# Patient Record
Sex: Male | Born: 1985
Health system: Southern US, Community
[De-identification: ages and names within clinical notes are randomized; demographics above are authoritative.]

## PROBLEM LIST (undated history)

## (undated) DIAGNOSIS — G4733 Obstructive sleep apnea (adult) (pediatric): Secondary | ICD-10-CM

## (undated) DIAGNOSIS — F0390 Unspecified dementia without behavioral disturbance: Secondary | ICD-10-CM

## (undated) DIAGNOSIS — R531 Weakness: Secondary | ICD-10-CM

## (undated) DIAGNOSIS — K219 Gastro-esophageal reflux disease without esophagitis: Secondary | ICD-10-CM

## (undated) DIAGNOSIS — R51 Headache: Secondary | ICD-10-CM

## (undated) DIAGNOSIS — R413 Other amnesia: Secondary | ICD-10-CM

## (undated) DIAGNOSIS — D352 Benign neoplasm of pituitary gland: Secondary | ICD-10-CM

## (undated) DIAGNOSIS — Q909 Down syndrome, unspecified: Secondary | ICD-10-CM

## (undated) DIAGNOSIS — R1319 Other dysphagia: Secondary | ICD-10-CM

## (undated) DIAGNOSIS — G4700 Insomnia, unspecified: Secondary | ICD-10-CM

## (undated) DIAGNOSIS — G919 Hydrocephalus, unspecified: Secondary | ICD-10-CM

## (undated) DIAGNOSIS — R1310 Dysphagia, unspecified: Secondary | ICD-10-CM

## (undated) HISTORY — PX: TYMPANOSTOMY TUBE PLACEMENT: SHX32

## (undated) HISTORY — DX: Dysphagia, unspecified: R13.10

## (undated) HISTORY — DX: Other amnesia: R41.3

## (undated) HISTORY — DX: Benign neoplasm of pituitary gland: D35.2

## (undated) HISTORY — DX: Unspecified dementia, unspecified severity, without behavioral disturbance, psychotic disturbance, mood disturbance, and anxiety: F03.90

## (undated) HISTORY — PX: BRAIN SURGERY: SHX531

## (undated) HISTORY — DX: Headache: R51

## (undated) HISTORY — DX: Insomnia, unspecified: G47.00

## (undated) HISTORY — PX: CSF SHUNT: SHX92

## (undated) HISTORY — DX: Weakness: R53.1

## (undated) HISTORY — DX: Other dysphagia: R13.19

## (undated) HISTORY — DX: Obstructive sleep apnea (adult) (pediatric): G47.33

## (undated) HISTORY — PX: VENTRICULOPERITONEAL SHUNT: SHX204

---
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
## 2005-01-25 ENCOUNTER — Emergency Department (HOSPITAL_COMMUNITY): Admission: EM | Admit: 2005-01-25 | Discharge: 2005-01-25 | Payer: Self-pay | Admitting: Emergency Medicine

## 2005-11-19 ENCOUNTER — Ambulatory Visit (HOSPITAL_COMMUNITY): Admission: RE | Admit: 2005-11-19 | Discharge: 2005-11-19 | Payer: Self-pay | Admitting: Pediatrics

## 2005-11-26 ENCOUNTER — Ambulatory Visit: Payer: Self-pay | Admitting: Pediatrics

## 2005-11-26 ENCOUNTER — Ambulatory Visit (HOSPITAL_COMMUNITY): Admission: RE | Admit: 2005-11-26 | Discharge: 2005-11-26 | Payer: Self-pay | Admitting: Pediatrics

## 2006-06-26 ENCOUNTER — Encounter: Admission: RE | Admit: 2006-06-26 | Discharge: 2006-06-26 | Payer: Self-pay | Admitting: Pediatrics

## 2008-01-18 ENCOUNTER — Ambulatory Visit (HOSPITAL_COMMUNITY): Payer: Self-pay | Admitting: Psychiatry

## 2008-02-15 ENCOUNTER — Ambulatory Visit (HOSPITAL_COMMUNITY): Payer: Self-pay | Admitting: Psychiatry

## 2008-03-01 ENCOUNTER — Ambulatory Visit (HOSPITAL_COMMUNITY): Payer: Self-pay | Admitting: Psychiatry

## 2008-03-21 ENCOUNTER — Ambulatory Visit (HOSPITAL_COMMUNITY): Payer: Self-pay | Admitting: Psychiatry

## 2008-05-02 ENCOUNTER — Ambulatory Visit (HOSPITAL_COMMUNITY): Payer: Self-pay | Admitting: Psychiatry

## 2008-06-14 ENCOUNTER — Ambulatory Visit (HOSPITAL_COMMUNITY): Payer: Self-pay | Admitting: Psychiatry

## 2009-04-03 ENCOUNTER — Ambulatory Visit (HOSPITAL_COMMUNITY): Payer: Self-pay | Admitting: Psychiatry

## 2009-08-03 ENCOUNTER — Ambulatory Visit (HOSPITAL_COMMUNITY): Payer: Self-pay | Admitting: Psychiatry

## 2009-10-31 ENCOUNTER — Ambulatory Visit (HOSPITAL_COMMUNITY): Payer: Self-pay | Admitting: Psychiatry

## 2010-03-14 ENCOUNTER — Ambulatory Visit (HOSPITAL_COMMUNITY): Admission: RE | Admit: 2010-03-14 | Discharge: 2010-03-14 | Payer: Self-pay | Admitting: Pediatrics

## 2010-03-18 ENCOUNTER — Ambulatory Visit: Payer: Self-pay | Admitting: Diagnostic Radiology

## 2010-03-18 ENCOUNTER — Inpatient Hospital Stay (HOSPITAL_COMMUNITY): Admission: AD | Admit: 2010-03-18 | Discharge: 2010-03-22 | Payer: Self-pay | Admitting: Internal Medicine

## 2010-03-18 ENCOUNTER — Encounter: Payer: Self-pay | Admitting: Emergency Medicine

## 2010-05-26 ENCOUNTER — Encounter: Payer: Self-pay | Admitting: Family Medicine

## 2010-05-26 ENCOUNTER — Encounter: Payer: Self-pay | Admitting: Pediatrics

## 2010-06-04 ENCOUNTER — Ambulatory Visit (HOSPITAL_COMMUNITY)
Admission: RE | Admit: 2010-06-04 | Discharge: 2010-06-04 | Payer: Self-pay | Source: Home / Self Care | Attending: Psychiatry | Admitting: Psychiatry

## 2010-07-17 LAB — URINE CULTURE
Colony Count: NO GROWTH
Culture  Setup Time: 201111140903
Culture: NO GROWTH
Special Requests: NEGATIVE

## 2010-07-17 LAB — CULTURE, BLOOD (ROUTINE X 2)
Culture  Setup Time: 201111131138
Culture: NO GROWTH

## 2010-07-17 LAB — CBC
HCT: 41.3 % (ref 39.0–52.0)
MCH: 31.7 pg (ref 26.0–34.0)
MCH: 32.2 pg (ref 26.0–34.0)
MCHC: 33.4 g/dL (ref 30.0–36.0)
MCV: 94.8 fL (ref 78.0–100.0)
Platelets: 154 10*3/uL (ref 150–400)
Platelets: 157 10*3/uL (ref 150–400)
RBC: 3.73 MIL/uL — ABNORMAL LOW (ref 4.22–5.81)
RDW: 13.8 % (ref 11.5–15.5)
RDW: 15.3 % (ref 11.5–15.5)
WBC: 10.9 10*3/uL — ABNORMAL HIGH (ref 4.0–10.5)
WBC: 5.7 10*3/uL (ref 4.0–10.5)

## 2010-07-17 LAB — EXPECTORATED SPUTUM ASSESSMENT W GRAM STAIN, RFLX TO RESP C

## 2010-07-17 LAB — BASIC METABOLIC PANEL
BUN: 11 mg/dL (ref 6–23)
BUN: 22 mg/dL (ref 6–23)
Calcium: 7.8 mg/dL — ABNORMAL LOW (ref 8.4–10.5)
Chloride: 105 mEq/L (ref 96–112)
Creatinine, Ser: 1.01 mg/dL (ref 0.4–1.5)
Creatinine, Ser: 1.3 mg/dL (ref 0.4–1.5)
GFR calc Af Amer: >60 mL/min (ref >60)
Glucose, Bld: 126 mg/dL — ABNORMAL HIGH (ref 70–99)
Potassium: 3.9 mEq/L (ref 3.5–5.1)

## 2010-07-17 LAB — URINALYSIS, ROUTINE W REFLEX MICROSCOPIC
Glucose, UA: NEGATIVE mg/dL
Ketones, ur: NEGATIVE mg/dL
Leukocytes, UA: NEGATIVE
Nitrite: NEGATIVE
pH: 6 (ref 5.0–8.0)

## 2010-07-17 LAB — URINE MICROSCOPIC-ADD ON

## 2010-07-17 LAB — DIFFERENTIAL
Basophils Absolute: 0 10*3/uL (ref 0.0–0.1)
Basophils Relative: 0 % (ref 0–1)
Eosinophils Absolute: 0 10*3/uL (ref 0.0–0.7)
Eosinophils Relative: 0 % (ref 0–5)
Lymphocytes Relative: 7 % — ABNORMAL LOW (ref 12–46)
Monocytes Absolute: 0.4 10*3/uL (ref 0.1–1.0)

## 2010-07-17 LAB — LEGIONELLA ANTIGEN, URINE

## 2010-07-17 LAB — LACTIC ACID, PLASMA: Lactic Acid, Venous: 1 mmol/L (ref 0.5–2.2)

## 2010-07-17 LAB — POCT I-STAT 3, ART BLOOD GAS (G3+)
Bicarbonate: 25.1 mEq/L — ABNORMAL HIGH (ref 20.0–24.0)
O2 Saturation: 84 %
Patient temperature: 101.8

## 2010-07-17 LAB — PROCALCITONIN: Procalcitonin: 1.55 ng/mL

## 2010-07-17 LAB — CULTURE, RESPIRATORY W GRAM STAIN: Culture: NORMAL

## 2010-07-17 LAB — MRSA PCR SCREENING: MRSA by PCR: NEGATIVE

## 2010-07-17 LAB — STREP PNEUMONIAE URINARY ANTIGEN: Strep Pneumo Urinary Antigen: NEGATIVE

## 2010-07-18 LAB — BASIC METABOLIC PANEL
Calcium: 9 mg/dL (ref 8.4–10.5)
GFR calc Af Amer: >60 mL/min (ref >60)
GFR calc non Af Amer: >60 mL/min (ref >60)
Glucose, Bld: 130 mg/dL — ABNORMAL HIGH (ref 70–99)
Potassium: 4.1 mEq/L (ref 3.5–5.1)
Sodium: 138 mEq/L (ref 135–145)

## 2010-07-18 LAB — CBC
HCT: 42.8 % (ref 39.0–52.0)
Hemoglobin: 14.4 g/dL (ref 13.0–17.0)
MCHC: 33.6 g/dL (ref 30.0–36.0)
RBC: 4.55 MIL/uL (ref 4.22–5.81)
WBC: 6.8 10*3/uL (ref 4.0–10.5)

## 2010-07-18 LAB — SURGICAL PCR SCREEN
MRSA, PCR: NEGATIVE
Staphylococcus aureus: NEGATIVE

## 2010-08-30 ENCOUNTER — Encounter (HOSPITAL_COMMUNITY): Payer: Medicare Other | Admitting: Psychiatry

## 2010-09-21 NOTE — Op Note (Signed)
[place]. [facility]  Patient:    Sergio [name], Sergio [name]                         MRN: [id number] Proc. Date: 08/14/99 Adm. Date:  [id number] Disc. Date: [id number] Attending:  Lauree Chandler                           Operative Report  PREOPERATIVE DIAGNOSIS:  Phimosis and possible urethral meatal stenosis.  POSTOPERATIVE DIAGNOSIS:  Phimosis and urethral meatal stenosis.  OPERATION PERFORMED:  Circumcision and urethral meatotomy.  SURGEON:  Maretta Bees. Vonita Moss, M.D.  ANESTHESIA:  General.  INDICATIONS FOR PROCEDURE:  This 25 year old white male with Down syndrome has had trouble with drawing the foreskin back and cleaning the glans penis. There was also a question of whether he has an adequate urethral meatus and he is brought to the operating room for these reasons.  DESCRIPTION OF PROCEDURE:  The patient was brought to the operating room and placed in supine position.  The external genitalia were prepped and draped in the usual fashion.  I sounded his urethral meatus and it was just slightly small and there was a dimple on the very end of the penis so he has a very mild glandular hypospadius with mild meatal stenosis and I did a dorsal meatotomy.  This was done with a straight clamp and a scissors.  Circumcision was then performed using the sleeve technique.  Hemostasis was obtained by use of electrocautery.  Interrupted 4-0 chromic catgut was used to close the circumcision.  Marcaine 0.25% was injected for postoperative analgesia.  The patient tolerated the procedure well and there was negligible blood loss.  The patient was taken to the operating room in good condition. DD:  08/14/99 TD:  08/14/99 Job: [id number]

## 2010-09-21 NOTE — Procedures (Signed)
HISTORY:  The patient is a 25 year old evaluated for visual and auditory  hallucinations to determine the presence or absence of seizures.   PROCEDURE:  The tracing is carried out in a 32 channel digital [employer]well  recorder reformatted into 16 channel montages with one devoted to EKG.  The  patient was awake and drowsy during the recording.  The [employer] 10/20  system lead placement was used.   DESCRIPTION OF FINDINGS:  Dominant frequency is at 12 Hz, 40 microvolt alpha  range activity that is prominent in the posterior regions but rather broadly  distributed mixed frequency low voltage upper theta and frontally  predominant beta range activity is seen, this of less than 10 microvolts.  The background begins the patient drowsiness with mixed frequency 6 Hz, 15-  60 microvolt activity.  Light natural sleep was not achieved.  Hyperventilation caused no change.  Photic stimulation was not carried out.   EKG showed regular sinus rhythm with ventricular response of 66-78 beats per  minute.   IMPRESSION:  Normal record with the patient awake and drowsy.      Sergio [name]. Sharene Skeans, M.D.  Electronically Signed     FIE:PPIR  D:  11/19/2005 14:24:37  T:  11/19/2005 17:52:48  Job #:  [id number]

## 2010-11-29 ENCOUNTER — Encounter (HOSPITAL_COMMUNITY): Payer: Medicare Other | Admitting: Psychiatry

## 2011-04-08 ENCOUNTER — Other Ambulatory Visit (HOSPITAL_COMMUNITY): Payer: Self-pay | Admitting: *Deleted

## 2011-04-08 MED ORDER — PALIPERIDONE ER 3 MG PO TB24: 3.0000 mg | ORAL_TABLET | ORAL | Status: DC

## 2011-06-03 DIAGNOSIS — C751 Malignant neoplasm of pituitary gland: Secondary | ICD-10-CM | POA: Diagnosis not present

## 2011-06-03 DIAGNOSIS — K219 Gastro-esophageal reflux disease without esophagitis: Secondary | ICD-10-CM | POA: Diagnosis not present

## 2011-06-04 ENCOUNTER — Other Ambulatory Visit (HOSPITAL_COMMUNITY): Payer: Self-pay | Admitting: Psychiatry

## 2011-06-04 ENCOUNTER — Other Ambulatory Visit (HOSPITAL_COMMUNITY): Payer: Self-pay

## 2011-06-04 DIAGNOSIS — G479 Sleep disorder, unspecified: Secondary | ICD-10-CM

## 2011-06-04 MED ORDER — CLONIDINE HCL 0.3 MG PO TABS: 0.3000 mg | ORAL_TABLET | Freq: Every day | ORAL | Status: DC

## 2011-06-04 MED ORDER — TRAZODONE HCL 100 MG PO TABS: ORAL_TABLET | ORAL | Status: DC

## 2011-07-02 ENCOUNTER — Other Ambulatory Visit (HOSPITAL_COMMUNITY): Payer: Self-pay | Admitting: Psychiatry

## 2011-07-02 DIAGNOSIS — F201 Disorganized schizophrenia: Secondary | ICD-10-CM

## 2011-07-05 ENCOUNTER — Inpatient Hospital Stay (HOSPITAL_BASED_OUTPATIENT_CLINIC_OR_DEPARTMENT_OTHER)
Admission: EM | Admit: 2011-07-05 | Discharge: 2011-07-08 | DRG: 194 | Disposition: A | Discharge status: Home / Self Care | Payer: Medicare Other | Attending: Internal Medicine | Admitting: Internal Medicine

## 2011-07-05 ENCOUNTER — Encounter (HOSPITAL_BASED_OUTPATIENT_CLINIC_OR_DEPARTMENT_OTHER): Payer: Self-pay | Admitting: *Deleted

## 2011-07-05 ENCOUNTER — Emergency Department (INDEPENDENT_AMBULATORY_CARE_PROVIDER_SITE_OTHER): Payer: Medicare Other

## 2011-07-05 DIAGNOSIS — R509 Fever, unspecified: Secondary | ICD-10-CM

## 2011-07-05 DIAGNOSIS — G911 Obstructive hydrocephalus: Secondary | ICD-10-CM | POA: Diagnosis present

## 2011-07-05 DIAGNOSIS — F259 Schizoaffective disorder, unspecified: Secondary | ICD-10-CM | POA: Diagnosis present

## 2011-07-05 DIAGNOSIS — R52 Pain, unspecified: Secondary | ICD-10-CM | POA: Diagnosis not present

## 2011-07-05 DIAGNOSIS — R918 Other nonspecific abnormal finding of lung field: Secondary | ICD-10-CM

## 2011-07-05 DIAGNOSIS — K219 Gastro-esophageal reflux disease without esophagitis: Secondary | ICD-10-CM

## 2011-07-05 DIAGNOSIS — Z888 Allergy status to other drugs, medicaments and biological substances status: Secondary | ICD-10-CM

## 2011-07-05 DIAGNOSIS — J189 Pneumonia, unspecified organism: Secondary | ICD-10-CM | POA: Diagnosis not present

## 2011-07-05 DIAGNOSIS — G479 Sleep disorder, unspecified: Secondary | ICD-10-CM

## 2011-07-05 DIAGNOSIS — Q909 Down syndrome, unspecified: Secondary | ICD-10-CM | POA: Diagnosis not present

## 2011-07-05 DIAGNOSIS — Z880 Allergy status to penicillin: Secondary | ICD-10-CM

## 2011-07-05 HISTORY — DX: Hydrocephalus, unspecified: G91.9

## 2011-07-05 HISTORY — DX: Down syndrome, unspecified: Q90.9

## 2011-07-05 HISTORY — DX: Gastro-esophageal reflux disease without esophagitis: K21.9

## 2011-07-05 LAB — BASIC METABOLIC PANEL
BUN: 16 mg/dL (ref 6–23)
Calcium: 8.8 mg/dL (ref 8.4–10.5)
GFR calc Af Amer: >90 mL/min (ref >90)
GFR calc non Af Amer: 83 mL/min — ABNORMAL LOW (ref >90)
Glucose, Bld: 110 mg/dL — ABNORMAL HIGH (ref 70–99)
Potassium: 3.9 mEq/L (ref 3.5–5.1)
Sodium: 137 mEq/L (ref 135–145)

## 2011-07-05 LAB — DIFFERENTIAL
Basophils Relative: 0 % (ref 0–1)
Lymphocytes Relative: 11 % — ABNORMAL LOW (ref 12–46)
Lymphs Abs: 0.8 10*3/uL (ref 0.7–4.0)
Monocytes Absolute: 0.1 10*3/uL (ref 0.1–1.0)
Monocytes Relative: 1 % — ABNORMAL LOW (ref 3–12)
Neutro Abs: 6.5 10*3/uL (ref 1.7–7.7)
Neutrophils Relative %: 88 % — ABNORMAL HIGH (ref 43–77)

## 2011-07-05 LAB — CBC
HCT: 43.5 % (ref 39.0–52.0)
Hemoglobin: 15 g/dL (ref 13.0–17.0)
RBC: 4.79 MIL/uL (ref 4.22–5.81)
WBC: 7.4 10*3/uL (ref 4.0–10.5)

## 2011-07-05 MED ORDER — MOXIFLOXACIN HCL IN NACL 400 MG/250ML IV SOLN
400.0000 mg | Freq: Once | INTRAVENOUS | Status: AC
  Administered 2011-07-05: 400 mg via INTRAVENOUS
  Filled 2011-07-05: qty 250

## 2011-07-05 MED ORDER — ACETAMINOPHEN 500 MG PO TABS
ORAL_TABLET | ORAL | Status: AC
  Filled 2011-07-05: qty 2

## 2011-07-05 MED ORDER — SODIUM CHLORIDE 0.9 % IV BOLUS (SEPSIS): 1000.0000 mL | Freq: Once | INTRAVENOUS | Status: DC

## 2011-07-05 MED ORDER — ACETAMINOPHEN 325 MG PO TABS
650.0000 mg | ORAL_TABLET | Freq: Once | ORAL | Status: AC
  Administered 2011-07-05: 650 mg via ORAL
  Filled 2011-07-05: qty 2

## 2011-07-05 MED ORDER — SODIUM CHLORIDE 0.9 % IV SOLN
Freq: Once | INTRAVENOUS | Status: AC
  Administered 2011-07-05: 1000 mL via INTRAVENOUS

## 2011-07-05 NOTE — ED Provider Notes (Signed)
History     CSN: [id number]  Arrival date & time 07/05/11  2000   First MD Initiated Contact with Patient 07/05/11 2025      Chief Complaint  Patient presents with  . Fever    (Consider location/radiation/quality/duration/timing/severity/associated sxs/prior treatment) Patient is a 26 y.o. male presenting with fever. The history is provided by a relative. The history is limited by a developmental delay.  Fever Primary symptoms of the febrile illness include fever.  His parents noted a shaking chill at 2 hours ago. This was followed by a fever which went up to 101 axillary at home. He has had a nonproductive cough for the last one to 2 days which they attributed to his GERD. He has not had any vomiting or diarrhea. He is complaining of aching all over.  Past Medical History  Diagnosis Date  . Down's syndrome   . GERD (gastroesophageal reflux disease)   . Hydrocephalus   . Schizoaffective disorder     Past Surgical History  Procedure Date  . Csf shunt   . Brain surgery     No family history on file.  History  Substance Use Topics  . Smoking status: Never Smoker   . Smokeless tobacco: Not on file  . Alcohol Use: No      Review of Systems  Unable to perform ROS: Psychiatric disorder  Constitutional: Positive for fever.    Allergies  Ceclor and Penicillins  Home Medications   Current Outpatient Rx  Name Route Sig Dispense Refill  . CLONIDINE HCL 0.3 MG PO TABS  TAKE ONE TABLET AT BEDTIME 30 tablet 2  . DONEPEZIL HCL 5 MG PO TABS Oral Take 5 mg by mouth at bedtime as needed.    . INVEGA 3 MG PO TB24  TAKE 1 TABLET IN MORNING 30 each 2  . OMEPRAZOLE 20 MG PO CPDR Oral Take 20 mg by mouth daily.    . TRAZODONE HCL 100 MG PO TABS  Take three tabs at bedtime. 90 tablet 0    BP 100/50  Pulse 140  Temp(Src) 103 F (39.4 C) (Oral)  Resp 20  Ht 5\' 1"  (1.549 m)  Wt 235 lb (106.595 kg)  BMI 44.40 kg/m2  SpO2 95%  Physical Exam  Nursing note and vitals  reviewed.  26 year old male who does appear mildly toxic. Vital signs are significant for fever of 103, tachycardia with heart rate 140. Oxygen saturation is 95% which is normal. Head is normocephalic and atraumatic. Facies of Down syndrome are present. Oropharynx is clear. Neck is nontender and supple without adenopathy. Back is nontender. Lungs are clear without rales, wheezes, or rhonchi. Heart has regular rate and rhythm which is tachycardic, a 2/6 systolic ejection murmur is present along left sternal border. Abdomen is obese, soft, nontender without masses or hepatosplenomegaly. Extremities have no cyanosis or edema. Skin is warm and dry without rash. Neurologic: He is oriented to person. Cranial nerves are grossly intact. There no focal motor or sensory deficits.  ED Course  Procedures (including critical care time)  Results for orders placed during the hospital encounter of 07/05/11  CBC      Component Value Range   WBC 7.4  4.0 - 10.5 (K/uL)   RBC 4.79  4.22 - 5.81 (MIL/uL)   Hemoglobin 15.0  13.0 - 17.0 (g/dL)   HCT 40.9  81.1 - 91.4 (%)   MCV 90.8  78.0 - 100.0 (fL)   MCH 31.3  26.0 - 34.0 (pg)  MCHC 34.5  30.0 - 36.0 (g/dL)   RDW 16.1  09.6 - 04.5 (%)   Platelets 232  150 - 400 (K/uL)  DIFFERENTIAL      Component Value Range   Neutrophils Relative 88 (*) 43 - 77 (%)   Neutro Abs 6.5  1.7 - 7.7 (K/uL)   Lymphocytes Relative 11 (*) 12 - 46 (%)   Lymphs Abs 0.8  0.7 - 4.0 (K/uL)   Monocytes Relative 1 (*) 3 - 12 (%)   Monocytes Absolute 0.1  0.1 - 1.0 (K/uL)   Eosinophils Relative 0  0 - 5 (%)   Eosinophils Absolute 0.0  0.0 - 0.7 (K/uL)   Basophils Relative 0  0 - 1 (%)   Basophils Absolute 0.0  0.0 - 0.1 (K/uL)  BASIC METABOLIC PANEL      Component Value Range   Sodium 137  135 - 145 (mEq/L)   Potassium 3.9  3.5 - 5.1 (mEq/L)   Chloride 101  96 - 112 (mEq/L)   CO2 23  19 - 32 (mEq/L)   Glucose, Bld 110 (*) 70 - 99 (mg/dL)   BUN 16  6 - 23 (mg/dL)   Creatinine, Ser  4.09  0.50 - 1.35 (mg/dL)   Calcium 8.8  8.4 - 81.1 (mg/dL)   GFR calc non Af Amer 83 (*) >90 (mL/min)   GFR calc Af Amer >90  >90 (mL/min)   Dg Chest Portable 1 View  07/05/2011  *RADIOLOGY REPORT*  Clinical Data: Fever started tonight.  Chills.  Aching.  Has Down syndrome.  PORTABLE CHEST - 1 VIEW  Comparison: 03/19/2010  Findings: Right IJ central line tip overlies the level of the upper superior vena cava.  The heart size is normal.  Heart size is accentuated by position.  There has been improvement in pulmonary edema.  There is opacity at the left lung base partially obscuring hemidiaphragms, consistent with atelectasis or possible effusion.  IMPRESSION:  1.  Some improvement in pulmonary edema. 2.  Left lower lobe opacity warrants followup.  Original Report Authenticated By: Patterson Hammersmith, M.D.     He was given IV fluids and oral acetaminophen. Chest x-ray is read by radiologist as possible effusion. However, I am concerned that the diaphragm is obscured and this likely represents a left lower lobe pneumonia. I would be most consistent with his clinical presentation today, and that is how he will be treated. He has allergies to penicillins and cephalosporins. He lives at home so he will be treated as community-acquired pneumonia. He is given a dose of IV moxifloxacin.  After acetaminophen and IV fluids, heart rate is coming down to 119 and temperature is down to 101.9. Case has been discussed with Dr. Cleotis Lema who agrees to accept him in transfer to the [facility].  1. Community acquired pneumonia    CRITICAL CARE Performed by: Dione Booze   Total critical care time: 35 minutes  Critical care time was exclusive of separately billable procedures and treating other patients.  Critical care was necessary to treat or prevent imminent or life-threatening deterioration.  Critical care was time spent personally by me on the following activities: development of treatment plan  with patient and/or surrogate as well as nursing, discussions with consultants, evaluation of patient's response to treatment, examination of patient, obtaining history from patient or surrogate, ordering and performing treatments and interventions, ordering and review of laboratory studies, ordering and review of radiographic studies, pulse oximetry and re-evaluation of patient's condition.  MDM  Fever and chills with tachycardia. Symptom complex is worrisome for pneumonia, although, UTI and influenza could also present similarly. He'll be given oral acetaminophen for his fever, IV fluids for tachycardia. Cultures will be obtained as well as chest x-ray.        Dione Booze, MD 07/05/11 2251

## 2011-07-05 NOTE — ED Notes (Signed)
Fever sudden onset tonight. Chills. Aching all over. Pt has Downs Syndrome.

## 2011-07-06 ENCOUNTER — Other Ambulatory Visit: Payer: Self-pay

## 2011-07-06 ENCOUNTER — Encounter (HOSPITAL_BASED_OUTPATIENT_CLINIC_OR_DEPARTMENT_OTHER): Payer: Self-pay | Admitting: Emergency Medicine

## 2011-07-06 DIAGNOSIS — R509 Fever, unspecified: Secondary | ICD-10-CM | POA: Diagnosis present

## 2011-07-06 DIAGNOSIS — F259 Schizoaffective disorder, unspecified: Secondary | ICD-10-CM | POA: Diagnosis not present

## 2011-07-06 DIAGNOSIS — Z888 Allergy status to other drugs, medicaments and biological substances status: Secondary | ICD-10-CM | POA: Diagnosis not present

## 2011-07-06 DIAGNOSIS — K219 Gastro-esophageal reflux disease without esophagitis: Secondary | ICD-10-CM | POA: Diagnosis present

## 2011-07-06 DIAGNOSIS — D696 Thrombocytopenia, unspecified: Secondary | ICD-10-CM | POA: Diagnosis not present

## 2011-07-06 DIAGNOSIS — Z880 Allergy status to penicillin: Secondary | ICD-10-CM | POA: Diagnosis not present

## 2011-07-06 DIAGNOSIS — G911 Obstructive hydrocephalus: Secondary | ICD-10-CM | POA: Diagnosis not present

## 2011-07-06 DIAGNOSIS — R05 Cough: Secondary | ICD-10-CM | POA: Diagnosis not present

## 2011-07-06 DIAGNOSIS — Q909 Down syndrome, unspecified: Secondary | ICD-10-CM | POA: Diagnosis not present

## 2011-07-06 DIAGNOSIS — J189 Pneumonia, unspecified organism: Secondary | ICD-10-CM | POA: Diagnosis not present

## 2011-07-06 DIAGNOSIS — R0602 Shortness of breath: Secondary | ICD-10-CM | POA: Diagnosis not present

## 2011-07-06 LAB — CBC
HCT: 40.9 % (ref 39.0–52.0)
Hemoglobin: 14 g/dL (ref 13.0–17.0)
MCH: 31.9 pg (ref 26.0–34.0)
MCHC: 34.2 g/dL (ref 30.0–36.0)
MCV: 93.2 fL (ref 78.0–100.0)
Platelets: DECREASED K/uL (ref 150–400)
RBC: 4.39 MIL/uL (ref 4.22–5.81)
RDW: 14.9 % (ref 11.5–15.5)
WBC: 5.4 K/uL (ref 4.0–10.5)

## 2011-07-06 LAB — COMPREHENSIVE METABOLIC PANEL WITH GFR
ALT: 181 U/L — ABNORMAL HIGH (ref 0–53)
AST: 89 U/L — ABNORMAL HIGH (ref 0–37)
Albumin: 2.7 g/dL — ABNORMAL LOW (ref 3.5–5.2)
Alkaline Phosphatase: 50 U/L (ref 39–117)
BUN: 11 mg/dL (ref 6–23)
CO2: 19 meq/L (ref 19–32)
Calcium: 8.8 mg/dL (ref 8.4–10.5)
Chloride: 107 meq/L (ref 96–112)
Creatinine, Ser: 1.05 mg/dL (ref 0.50–1.35)
GFR calc Af Amer: >90 mL/min
GFR calc non Af Amer: >90 mL/min
Glucose, Bld: 117 mg/dL — ABNORMAL HIGH (ref 70–99)
Potassium: 4.2 meq/L (ref 3.5–5.1)
Sodium: 138 meq/L (ref 135–145)
Total Bilirubin: 0.3 mg/dL (ref 0.3–1.2)
Total Protein: 6.1 g/dL (ref 6.0–8.3)

## 2011-07-06 LAB — DIFFERENTIAL
Basophils Absolute: 0 K/uL (ref 0.0–0.1)
Basophils Relative: 0 % (ref 0–1)
Eosinophils Absolute: 0 K/uL (ref 0.0–0.7)
Eosinophils Relative: 1 % (ref 0–5)
Lymphocytes Relative: 31 % (ref 12–46)
Lymphs Abs: 1.7 K/uL (ref 0.7–4.0)
Monocytes Absolute: 0.1 K/uL (ref 0.1–1.0)
Monocytes Relative: 2 % — ABNORMAL LOW (ref 3–12)
Neutro Abs: 3.6 K/uL (ref 1.7–7.7)
Neutrophils Relative %: 66 % (ref 43–77)

## 2011-07-06 LAB — INFLUENZA PANEL BY PCR (TYPE A & B)
H1N1 flu by pcr: NOT DETECTED
Influenza A By PCR: NEGATIVE
Influenza B By PCR: NEGATIVE

## 2011-07-06 LAB — RAPID HIV SCREEN (WH-MAU): Rapid HIV Screen: NONREACTIVE

## 2011-07-06 LAB — MRSA PCR SCREENING: MRSA by PCR: NEGATIVE

## 2011-07-06 LAB — STREP PNEUMONIAE URINARY ANTIGEN: Strep Pneumo Urinary Antigen: NEGATIVE

## 2011-07-06 MED ORDER — ENOXAPARIN SODIUM 40 MG/0.4ML ~~LOC~~ SOLN
40.0000 mg | SUBCUTANEOUS | Status: DC
  Administered 2011-07-06 – 2011-07-07 (×2): 40 mg via SUBCUTANEOUS
  Filled 2011-07-06 (×4): qty 0.4

## 2011-07-06 MED ORDER — DONEPEZIL HCL 5 MG PO TABS
5.0000 mg | ORAL_TABLET | Freq: Every day | ORAL | Status: DC
  Administered 2011-07-06 – 2011-07-07 (×2): 5 mg via ORAL
  Filled 2011-07-06 (×4): qty 1

## 2011-07-06 MED ORDER — OSELTAMIVIR PHOSPHATE 75 MG PO CAPS
75.0000 mg | ORAL_CAPSULE | Freq: Two times a day (BID) | ORAL | Status: DC
  Administered 2011-07-06: 75 mg via ORAL
  Filled 2011-07-06 (×2): qty 1

## 2011-07-06 MED ORDER — ALUM & MAG HYDROXIDE-SIMETH 200-200-20 MG/5ML PO SUSP
30.0000 mL | Freq: Once | ORAL | Status: AC
  Administered 2011-07-06: 30 mL via ORAL
  Filled 2011-07-06: qty 30

## 2011-07-06 MED ORDER — CLONIDINE HCL 0.3 MG PO TABS
0.3000 mg | ORAL_TABLET | Freq: Every day | ORAL | Status: DC
  Filled 2011-07-06: qty 1

## 2011-07-06 MED ORDER — CLONIDINE HCL 0.3 MG PO TABS
0.3000 mg | ORAL_TABLET | Freq: Every day | ORAL | Status: DC
  Administered 2011-07-06: 0.3 mg via ORAL
  Filled 2011-07-06 (×4): qty 1

## 2011-07-06 MED ORDER — DONEPEZIL HCL 5 MG PO TABS: 5.0000 mg | ORAL_TABLET | Freq: Every day | ORAL | Status: DC

## 2011-07-06 MED ORDER — CHLORHEXIDINE GLUCONATE CLOTH 2 % EX PADS
6.0000 | MEDICATED_PAD | Freq: Every day | CUTANEOUS | Status: DC
  Administered 2011-07-06 – 2011-07-08 (×3): 6 via TOPICAL

## 2011-07-06 MED ORDER — CLONIDINE HCL 0.3 MG PO TABS
0.3000 mg | ORAL_TABLET | ORAL | Status: AC
  Administered 2011-07-06: 0.3 mg via ORAL
  Filled 2011-07-06: qty 1

## 2011-07-06 MED ORDER — ONDANSETRON HCL 4 MG/2ML IJ SOLN: 4.0000 mg | Freq: Three times a day (TID) | INTRAMUSCULAR | Status: AC | PRN

## 2011-07-06 MED ORDER — SODIUM CHLORIDE 0.9 % IV SOLN
INTRAVENOUS | Status: DC
  Administered 2011-07-06: 04:00:00 via INTRAVENOUS
  Administered 2011-07-07: 75 mL/h via INTRAVENOUS
  Administered 2011-07-07: 22:00:00 via INTRAVENOUS

## 2011-07-06 MED ORDER — SODIUM CHLORIDE 0.9 % IV SOLN
INTRAVENOUS | Status: DC
  Administered 2011-07-06: 03:00:00 via INTRAVENOUS

## 2011-07-06 MED ORDER — ALBUTEROL SULFATE (5 MG/ML) 0.5% IN NEBU: 2.5000 mg | INHALATION_SOLUTION | Freq: Four times a day (QID) | RESPIRATORY_TRACT | Status: DC | PRN

## 2011-07-06 MED ORDER — MOXIFLOXACIN HCL IN NACL 400 MG/250ML IV SOLN
400.0000 mg | INTRAVENOUS | Status: DC
  Administered 2011-07-06 – 2011-07-07 (×2): 400 mg via INTRAVENOUS
  Filled 2011-07-06 (×3): qty 250

## 2011-07-06 MED ORDER — ACETAMINOPHEN 325 MG PO TABS
650.0000 mg | ORAL_TABLET | ORAL | Status: DC | PRN
  Administered 2011-07-06 (×2): 650 mg via ORAL
  Filled 2011-07-06 (×2): qty 2

## 2011-07-06 MED ORDER — SODIUM CHLORIDE 0.9 % IV SOLN: 250.0000 mL | INTRAVENOUS | Status: DC | PRN

## 2011-07-06 MED ORDER — MUPIROCIN 2 % EX OINT
1.0000 "application " | TOPICAL_OINTMENT | Freq: Two times a day (BID) | CUTANEOUS | Status: DC
  Administered 2011-07-06: 1 via NASAL
  Filled 2011-07-06: qty 22

## 2011-07-06 MED ORDER — PALIPERIDONE ER 3 MG PO TB24
3.0000 mg | ORAL_TABLET | Freq: Every day | ORAL | Status: DC
  Administered 2011-07-06 – 2011-07-08 (×3): 3 mg via ORAL
  Filled 2011-07-06 (×3): qty 1

## 2011-07-06 MED ORDER — TRAZODONE HCL 150 MG PO TABS
300.0000 mg | ORAL_TABLET | Freq: Every day | ORAL | Status: DC
  Administered 2011-07-06 – 2011-07-07 (×2): 300 mg via ORAL
  Filled 2011-07-06 (×4): qty 2

## 2011-07-06 MED ORDER — SODIUM CHLORIDE 0.9 % IJ SOLN: 3.0000 mL | INTRAMUSCULAR | Status: DC | PRN

## 2011-07-06 MED ORDER — DONEPEZIL HCL 5 MG PO TABS
5.0000 mg | ORAL_TABLET | Freq: Once | ORAL | Status: AC
  Administered 2011-07-06: 5 mg via ORAL
  Filled 2011-07-06 (×2): qty 1

## 2011-07-06 MED ORDER — TRAZODONE HCL 150 MG PO TABS
300.0000 mg | ORAL_TABLET | ORAL | Status: AC
  Administered 2011-07-06: 300 mg via ORAL
  Filled 2011-07-06: qty 2

## 2011-07-06 MED ORDER — SODIUM CHLORIDE 0.9 % IJ SOLN: 3.0000 mL | Freq: Two times a day (BID) | INTRAMUSCULAR | Status: DC

## 2011-07-06 NOTE — H&P (Signed)
PCP:   Rudi Heap, MD, MD   Chief Complaint:  Fever and chills.  HPI: This 26 year old male with a history of Down's syndrome. He was noted by his mother to be having cold chills. He was found to have a fever 101 axillary. No other symptom on presentation noted by the mother. He was not exposed to sick people that she is aware of. He did have a prior history of pneumonia. He did have a flu shot in November. He was seen at a [place] and chest x-ray was suggestive of a left lower lobe opacity. Patient was referred for admission to [facility].  Review of Systems:  The patient positive for anorexia, fever. No weight loss,, vision loss, decreased hearing, hoarseness, chest pain, syncope, dyspnea on exertion, peripheral edema, balance deficits, hemoptysis, abdominal pain, melena, hematochezia, severe indigestion/heartburn, hematuria, incontinence, genital sores, muscle weakness, suspicious skin lesions, transient blindness, difficulty walking, depression, unusual weight change, abnormal bleeding, enlarged lymph nodes, angioedema, and breast masses.  Past Medical History: Past Medical History  Diagnosis Date  . Down's syndrome   . GERD (gastroesophageal reflux disease)   . Hydrocephalus   . Schizoaffective disorder    Past Surgical History  Procedure Date  . Csf shunt   . Brain surgery     Medications: Prior to Admission medications   Medication Sig Start Date End Date Taking? Authorizing Provider  cloNIDine (CATAPRES) 0.3 MG tablet TAKE ONE TABLET AT BEDTIME 07/02/11  Yes Nelly Rout, MD  donepezil (ARICEPT) 5 MG tablet Take 5 mg by mouth at bedtime as needed.   Yes Historical Provider, MD  INVEGA 3 MG 24 hr tablet TAKE 1 TABLET IN MORNING 07/02/11  Yes Nelly Rout, MD  omeprazole (PRILOSEC) 20 MG capsule Take 20 mg by mouth daily.   Yes Historical Provider, MD  traZODone (DESYREL) 100 MG tablet Take three tabs at bedtime. 06/04/11  Yes Nelly Rout, MD    Allergies:   Allergies  Allergen Reactions  . Ceclor (Cefaclor) Anaphylaxis  . Penicillins Anaphylaxis    Social History:  reports that he has never smoked. He does not have any smokeless tobacco history on file. He reports that he does not drink alcohol or use illicit drugs. The patient is normally at baseline able to function independently with some guidance.  Family History: No contributory family history.  Physical Exam: Filed Vitals:   07/06/11 0100 07/06/11 0128 07/06/11 0129 07/06/11 0238  BP: 112/50 106/53  147/60  Pulse: 95 84  86  Temp:    99.3 F (37.4 C)  TempSrc:    Axillary  Resp: 26 26  26   Height:      Weight:      SpO2: 94% 94% 94% 96%   Physical exam. Gen. appearance. Well-developed young male in no distress. Patient is alert and cooperative. Pleasant and follows commands well. HEENT. Head normocephalic. Pupils are equal. Hearing normal to conversational tones. Oral mucosa unremarkable. No cervical adenopathy. Cardiac. Rate and rhythm regular. No jugular venous distention or edema. Lungs. Reduced in the left base. Otherwise clear without distress. Stable O2 sats. Abdomen. Soft with positive bowel sounds. No pain. Urinary genital. No bladder pain but has right CVA tenderness. Musculoskeletal range of motion in all 4 extremities full. Strength 5/5 and equal in all 4 extremities. Neurologic. No obvious cranial nerve deficits. No unilateral or focal defects found Skin. Warm and dry/hot consistent with fever. Turgor appears adequate.  Labs on Admission:   [facility] 07/05/11 2134  NA 137  K 3.9  CL 101  CO2 23  GLUCOSE 110*  BUN 16  CREATININE 1.20  CALCIUM 8.8  MG --  PHOS --   No results found for this basename: AST:2,ALT:2,ALKPHOS:2,BILITOT:2,PROT:2,ALBUMIN:2 in the last 72 hours No results found for this basename: LIPASE:2,AMYLASE:2 in the last 72 hours  Basename 07/05/11 2037  WBC 7.4  NEUTROABS 6.5  HGB 15.0  HCT 43.5  MCV 90.8  PLT 232   No results  found for this basename: CKTOTAL:3,CKMB:3,CKMBINDEX:3,TROPONINI:3 in the last 72 hours No results found for this basename: TSH,T4TOTAL,FREET3,T3FREE,THYROIDAB in the last 72 hours No results found for this basename: VITAMINB12:2,FOLATE:2,FERRITIN:2,TIBC:2,IRON:2,RETICCTPCT:2 in the last 72 hours  Radiological Exams on Admission: Dg Chest Portable 1 View  07/05/2011  *RADIOLOGY REPORT*  Clinical Data: Fever started tonight.  Chills.  Aching.  Has Down syndrome.  PORTABLE CHEST - 1 VIEW  Comparison: 03/19/2010  Findings: Right IJ central line tip overlies the level of the upper superior vena cava.  The heart size is normal.  Heart size is accentuated by position.  There has been improvement in pulmonary edema.  There is opacity at the left lung base partially obscuring hemidiaphragms, consistent with atelectasis or possible effusion.  IMPRESSION:  1.  Some improvement in pulmonary edema. 2.  Left lower lobe opacity warrants followup.  Original Report Authenticated By: Patterson Hammersmith, M.D.    Assessment/Plan Problem #1. Pneumonia. Blood cultures have already been obtained. The patient was started by EDP on Avelox and I will continue this dosing IV daily. He does not appear to have a productive cough per mothers report. I have asked for a sputum culture if possible. I have added albuterol nebulizers on a when necessary basis. Repeat a CBC in a.m. Given the high fever of 103 possible viral etiology. I will check influenza PCR and empirically start the patient on Tamiflu which can be discontinued if PCR negative. Problem #2 schizophrenia. Will continue patient's antipsychotic augmented with clonidine. Patient was alert and pleasant currently. Problem #3 insomnia. We'll continue her trazodone and Aricept as per home dosing which was confirmed by mother. Problem #4. GERD. I will hold a proton pump inhibitor during antibiotic therapy to reduce risk of C. difficile. Problem #5. Right flank pain. No indication  of UTI per urine microscopic. Urine culture has been requested. No change in current antibiotics for now. Patient,  is to be a full code per mothers wishes.  We will respect these wishes.  I anticipate his length of stay to be 2-3 days based on current information unless something should change.  Time spent on this patient including examination and decision-making process: 60 minutes.  Rolan Lipa [phone] 07/06/2011, 4:49 AM     Assessment/Plan: Present on Admission:  .Pneumonia .Fever .GERD (gastroesophageal reflux disease) .Schizoaffective disorder   Plan:  Agree with the Above Assessment by Benedetto Coons, NP. Continue IV Avelox for CAP Community Acquired Pneumonia Nebs, and O2 PRN Medications Reconciled. Other plans as per orders.    CODE STATUS:      FULL CODE        Ashyia Schraeder C 07/06/2011, 6:02 AM

## 2011-07-06 NOTE — ED Notes (Signed)
Report given to [facility] 

## 2011-07-06 NOTE — ED Notes (Signed)
Report called to Christine,RN on 5000 at [facility]

## 2011-07-06 NOTE — Progress Notes (Signed)
Patient seen and examined, admitted by Dr. Lovell Sheehan this morning. Briefly, 26 year old male with history of Down syndrome admitted with pneumonia. - Agree with assessment and plan per H&P - Continue IV Avelox, nebs, O2 - Added clonidine each bedtime prior mother's request, diet change to regular - Will follow closely   Varnell Orvis M.D. [facility] 07/06/2011, 2:12 PM  Pager: [phone]

## 2011-07-07 LAB — CBC
HCT: 43.4 % (ref 39.0–52.0)
Hemoglobin: 14.4 g/dL (ref 13.0–17.0)
MCH: 31.6 pg (ref 26.0–34.0)
MCHC: 33.2 g/dL (ref 30.0–36.0)
MCV: 95.2 fL (ref 78.0–100.0)
RDW: 15.4 % (ref 11.5–15.5)

## 2011-07-07 LAB — LEGIONELLA ANTIGEN, URINE: Legionella Antigen, Urine: NEGATIVE

## 2011-07-07 LAB — URINE CULTURE
Colony Count: NO GROWTH
Culture  Setup Time: 201303020517

## 2011-07-07 LAB — BASIC METABOLIC PANEL
BUN: 14 mg/dL (ref 6–23)
Creatinine, Ser: 1.07 mg/dL (ref 0.50–1.35)
GFR calc Af Amer: >90 mL/min (ref >90)
GFR calc non Af Amer: >90 mL/min (ref >90)
Glucose, Bld: 130 mg/dL — ABNORMAL HIGH (ref 70–99)
Potassium: 4.4 mEq/L (ref 3.5–5.1)

## 2011-07-07 NOTE — Progress Notes (Signed)
Patient ID: Sergio [name]    AVW:[id number]    DOB: 1985-10-16    DOA: 07/05/2011  PCP: Rudi Heap, MD, MD  Subjective: Improving today, father at bedside no specific complaints  Objective: Weight change:   Intake/Output Summary (Last 24 hours) at 07/07/11 1755 Last data filed at 07/07/11 1648  Gross per 24 hour  Intake   3301 ml  Output      0 ml  Net   3301 ml   Blood pressure 124/66, pulse 60, temperature 98.7 F (37.1 C), temperature source Oral, resp. rate 20, height 5\' 1"  (1.549 m), weight 106.595 kg (235 lb), SpO2 98.00%.  Physical Exam: General: Alert and awake, oriented x3, not in any acute distress. HEENT: anicteric sclera, pupils reactive to light and accommodation, EOMI CVS: S1-S2 clear, no murmur rubs or gallops Chest: clear to auscultation bilaterally, no wheezing, rales or rhonchi Abdomen: soft nontender, nondistended, normal bowel sounds, no organomegaly Extremities: no cyanosis, clubbing or edema noted bilaterally Neuro: Cranial nerves II-XII intact, no focal neurological deficits  Lab Results: Basic Metabolic Panel:  Lab 07/07/11 8295 07/06/11 1130  NA 138 138  K 4.4 4.2  CL 107 107  CO2 24 19  GLUCOSE 130* 117*  BUN 14 11  CREATININE 1.07 1.05  CALCIUM 8.8 8.8  MG -- --  PHOS -- --   Liver Function Tests:  Lab 07/06/11 1130  AST 89*  ALT 181*  ALKPHOS 50  BILITOT 0.3  PROT 6.1  ALBUMIN 2.7*   No results found for this basename: LIPASE:2,AMYLASE:2 in the last 168 hours No results found for this basename: AMMONIA:2 in the last 168 hours CBC:  Lab 07/07/11 0815 07/06/11 1130  WBC 5.7 5.4  NEUTROABS -- 3.6  HGB 14.4 14.0  HCT 43.4 40.9  MCV 95.2 93.2  PLT 177 PLATELETS APPEAR DECREASED   Cardiac Enzymes: No results found for this basename: CKTOTAL:3,CKMB:3,CKMBINDEX:3,TROPONINI:3 in the last 168 hours BNP: No components found with this basename: POCBNP:2 CBG: No results found for this basename: GLUCAP:5 in the last 168  hours   Micro Results: Recent Results (from the past 240 hour(s))  URINE CULTURE     Status: Normal   Collection Time   07/05/11  9:19 PM      Component Value Range Status Comment   Specimen Description URINE, CLEAN CATCH   Final    Special Requests NONE   Final    Culture  Setup Time [id number]   Final    Colony Count NO GROWTH   Final    Culture NO GROWTH   Final    Report Status 07/07/2011 FINAL   Final   CULTURE, BLOOD (ROUTINE X 2)     Status: Normal (Preliminary result)   Collection Time   07/05/11  9:20 PM      Component Value Range Status Comment   Specimen Description BLOOD LEFT WRIST   Final    Special Requests NONE BOTTLES DRAWN AEROBIC AND ANAEROBIC [facility]   Final    Culture  Setup Time [id number]   Final    Culture     Final    Value:        BLOOD CULTURE RECEIVED NO GROWTH TO DATE CULTURE WILL BE HELD FOR 5 DAYS BEFORE ISSUING A FINAL NEGATIVE REPORT   Report Status PENDING   Incomplete   CULTURE, BLOOD (ROUTINE X 2)     Status: Normal (Preliminary result)   Collection Time   07/05/11  9:20 PM  Component Value Range Status Comment   Specimen Description BLOOD LEFT WRIST   Final    Special Requests NONE BOTTLES DRAWN AEROBIC AND ANAEROBIC 2 CC EACH   Final    Culture  Setup Time [id number]   Final    Culture     Final    Value:        BLOOD CULTURE RECEIVED NO GROWTH TO DATE CULTURE WILL BE HELD FOR 5 DAYS BEFORE ISSUING A FINAL NEGATIVE REPORT   Report Status PENDING   Incomplete   MRSA PCR SCREENING     Status: Normal   Collection Time   07/06/11  6:50 AM      Component Value Range Status Comment   MRSA by PCR NEGATIVE  NEGATIVE  Final     Studies/Results: Dg Chest Portable 1 View  07/05/2011  *RADIOLOGY REPORT*  Clinical Data: Fever started tonight.  Chills.  Aching.  Has Down syndrome.  PORTABLE CHEST - 1 VIEW  Comparison: 03/19/2010  Findings: Right IJ central line tip overlies the level of the upper superior vena cava.  The heart size is normal.   Heart size is accentuated by position.  There has been improvement in pulmonary edema.  There is opacity at the left lung base partially obscuring hemidiaphragms, consistent with atelectasis or possible effusion.  IMPRESSION:  1.  Some improvement in pulmonary edema. 2.  Left lower lobe opacity warrants followup.  Original Report Authenticated By: Patterson Hammersmith, M.D.    Medications: Scheduled Meds:   . alum & mag hydroxide-simeth  30 mL Oral Once  . Chlorhexidine Gluconate Cloth  6 each Topical Q0600  . cloNIDine  0.3 mg Oral QHS  . donepezil  5 mg Oral QHS  . enoxaparin  40 mg Subcutaneous Q24H  . moxifloxacin  400 mg Intravenous Q24H  . paliperidone  3 mg Oral Daily  . sodium chloride  1,000 mL Intravenous Once  . traZODone  300 mg Oral QHS  . DISCONTD: mupirocin ointment  1 application Nasal BID  . DISCONTD: oseltamivir  75 mg Oral BID   Continuous Infusions:   . sodium chloride 75 mL/hr (07/07/11 0259)     Assessment/Plan: Principal Problem:  *Pneumonia - Improving, continue IV Avelox, nebs, O2  Active Problems:  Fever: resolved   GERD (gastroesophageal reflux disease)   Down syndrome/ Hydrocephalus: cont aricept   Schizoaffective disorder: stable  DVT Prophylaxis: lovenox  Code Status: full code  Disposition: hopefully in am   LOS: 2 days   Shylo Dillenbeck M.D. [facility] 07/07/2011, 5:55 PM Pager: [phone]

## 2011-07-08 LAB — BASIC METABOLIC PANEL
BUN: 16 mg/dL (ref 6–23)
Calcium: 9.1 mg/dL (ref 8.4–10.5)
GFR calc Af Amer: >90 mL/min (ref >90)
GFR calc non Af Amer: 89 mL/min — ABNORMAL LOW (ref >90)
Glucose, Bld: 101 mg/dL — ABNORMAL HIGH (ref 70–99)
Potassium: 4.3 mEq/L (ref 3.5–5.1)
Sodium: 139 mEq/L (ref 135–145)

## 2011-07-08 LAB — GLUCOSE, CAPILLARY: Glucose-Capillary: 106 mg/dL — ABNORMAL HIGH (ref 70–99)

## 2011-07-08 LAB — URINALYSIS, ROUTINE W REFLEX MICROSCOPIC
Bilirubin Urine: NEGATIVE
Glucose, UA: NEGATIVE mg/dL
Ketones, ur: NEGATIVE mg/dL
Leukocytes, UA: NEGATIVE
Protein, ur: NEGATIVE mg/dL

## 2011-07-08 LAB — CBC
Hemoglobin: 14.6 g/dL (ref 13.0–17.0)
MCH: 31.3 pg (ref 26.0–34.0)
MCHC: 32.9 g/dL (ref 30.0–36.0)
RDW: 15.2 % (ref 11.5–15.5)

## 2011-07-08 MED ORDER — FLAVOXATE HCL 100 MG PO TABS: 100.0000 mg | ORAL_TABLET | Freq: Three times a day (TID) | ORAL | Status: DC | PRN

## 2011-07-08 MED ORDER — MOXIFLOXACIN HCL 400 MG PO TABS: 400.0000 mg | ORAL_TABLET | Freq: Every day | ORAL | Status: DC

## 2011-07-08 MED ORDER — TRAZODONE HCL 100 MG PO TABS: ORAL_TABLET | ORAL | Status: DC

## 2011-07-08 MED ORDER — SODIUM CHLORIDE 0.9 % IV SOLN: 250.0000 mL | INTRAVENOUS | Status: DC | PRN

## 2011-07-08 MED ORDER — SODIUM CHLORIDE 0.9 % IJ SOLN: 3.0000 mL | Freq: Two times a day (BID) | INTRAMUSCULAR | Status: DC

## 2011-07-08 MED ORDER — LEVOFLOXACIN 750 MG PO TABS: 750.0000 mg | ORAL_TABLET | Freq: Every day | ORAL | Status: AC

## 2011-07-08 MED ORDER — SODIUM CHLORIDE 0.9 % IJ SOLN: 3.0000 mL | INTRAMUSCULAR | Status: DC | PRN

## 2011-07-08 NOTE — Discharge Summary (Signed)
Physician Discharge Summary  Patient ID: Sergio [name] MRN: [id number] DOB/AGE: Dec 18, 1985 26 y.o.  Admit date: 07/05/2011 Discharge date: 07/08/2011  Primary Care Physician:  Rudi Heap, MD, MD  Discharge Diagnoses:   Present on Admission:  .Pneumonia .Fever .GERD (gastroesophageal reflux disease) .Schizoaffective disorder  Consults:  None  Discharge Medications: Medication List  As of 07/08/2011  6:40 PM   TAKE these medications         cloNIDine 0.3 MG tablet   Commonly known as: CATAPRES   TAKE ONE TABLET AT BEDTIME      donepezil 5 MG tablet   Commonly known as: ARICEPT   Take 5 mg by mouth at bedtime as needed.      flavoxATE 100 MG tablet   Commonly known as: URISPAS   Take 1 tablet (100 mg total) by mouth 3 (three) times daily as needed (for bladder spasms).      INVEGA 3 MG 24 hr tablet   Generic drug: paliperidone   TAKE 1 TABLET IN MORNING      levofloxacin 750 MG tablet   Commonly known as: LEVAQUIN   Take 1 tablet (750 mg total) by mouth daily.      omeprazole 20 MG capsule   Commonly known as: PRILOSEC   Take 20 mg by mouth daily.      traZODone 100 MG tablet   Commonly known as: DESYREL   Take three tabs at bedtime.             Brief H and P: For complete details please refer to admission H and P, but in brief 26 year old male with history of Down syndrome, was noted by his mother to having cold chills and fever off 101. He did have a prior history of pneumonia he was seen at [facility] emergency room and chest x-ray was just above left lower lobe opacity and was referred to admission.  Hospital Course:    *Pneumonia: Patient was admitted to medical service, placed on when necessary nebs, O2 supplementation, IV antibiotics. Patient did extremely well and had no further fevers or leukocytosis. He was discharged on Levaquin for 7 days and follow up with his PCP. At the time of the discharge, patient was tolerating diet without any difficulty and  ambulating in the room. Per patient's mother, he takes clonidine for sleep as recommended by his psychiatry physician. I recommended them to hold clonidine for a few days until his BP improves.  Fever: Resolved   Down syndrome/ Hydrocephalus: No other acute issues   Schizoaffective disorder: Patient was continued on his psych medications   Day of Discharge BP 137/80  Pulse 73  Temp(Src) 97.6 F (36.4 C) (Axillary)  Resp 18  Ht 5\' 1"  (1.549 m)  Wt 106.595 kg (235 lb)  BMI 44.40 kg/m2  SpO2 96%  Physical Exam: General: Alert and awake  not in any acute distress. HEENT: anicteric sclera, pupils reactive to light and accommodation CVS: S1-S2 clear no murmur rubs or gallops Chest: clear to auscultation bilaterally, no wheezing rales or rhonchi Abdomen: soft nontender, nondistended, normal bowel sounds, no organomegaly Extremities: no cyanosis, clubbing or edema noted bilaterally    The results of significant diagnostics from this hospitalization (including imaging, microbiology, ancillary and laboratory) are listed below for reference.    LAB RESULTS: Basic Metabolic Panel:  Lab 07/08/11 4098 07/07/11 0815  NA 139 138  K 4.3 4.4  CL 106 107  CO2 22 24  GLUCOSE 101* 130*  BUN 16  14  CREATININE 1.13 1.07  CALCIUM 9.1 8.8  MG -- --  PHOS -- --   Liver Function Tests:  Lab 07/06/11 1130  AST 89*  ALT 181*  ALKPHOS 50  BILITOT 0.3  PROT 6.1  ALBUMIN 2.7*   CBC:  Lab 07/08/11 0530 07/07/11 0815 07/06/11 1130  WBC 6.1 5.7 --  NEUTROABS -- -- 3.6  HGB 14.6 14.4 --  HCT 44.4 43.4 --  MCV 95.3 -- --  PLT 201 177 --   CBG:  Lab 07/06/11 1051  GLUCAP 106*    Significant Diagnostic Studies:  Dg Chest Portable 1 View  07/05/2011   IMPRESSION:  1.  Some improvement in pulmonary edema. 2.  Left lower lobe opacity warrants followup.  Original Report Authenticated By: Patterson Hammersmith, M.D.     Disposition and Follow-up: Discharge Orders    Future Orders  Please Complete By Expires   Diet general      Increase activity slowly      Discharge instructions      Comments:   Please hold clonidine for a few days until BP more stable       DISPOSITION: Home  DIET: Regular  ACTIVITY: As tolerated   DISCHARGE FOLLOW-UP Follow-up Information    Follow up with Rudi Heap, MD. Schedule an appointment as soon as possible for a visit in 10 days. (for hospital follow-up)          Time spent on Discharge: 45 minutes  Signed:  RAI,RIPUDEEP M.D. [facility] 07/08/2011, 6:40 PM

## 2011-07-08 NOTE — Progress Notes (Signed)
Utilization Review Completed.Sergio [name] T3/08/2011   

## 2011-07-09 ENCOUNTER — Ambulatory Visit (HOSPITAL_COMMUNITY): Payer: Medicare Other | Admitting: Psychiatry

## 2011-07-09 LAB — URINE CULTURE
Colony Count: NO GROWTH
Culture  Setup Time: 201303041217

## 2011-07-12 LAB — CULTURE, BLOOD (ROUTINE X 2)
Culture  Setup Time: 201303020335
Culture: NO GROWTH
Culture: NO GROWTH

## 2011-07-25 ENCOUNTER — Ambulatory Visit (HOSPITAL_COMMUNITY): Payer: Medicare Other | Admitting: Psychiatry

## 2011-08-15 ENCOUNTER — Other Ambulatory Visit (HOSPITAL_COMMUNITY): Payer: Self-pay | Admitting: *Deleted

## 2011-08-15 ENCOUNTER — Ambulatory Visit (INDEPENDENT_AMBULATORY_CARE_PROVIDER_SITE_OTHER): Payer: Medicare Other | Admitting: Psychiatry

## 2011-08-15 ENCOUNTER — Encounter (HOSPITAL_COMMUNITY): Payer: Self-pay | Admitting: Psychiatry

## 2011-08-15 DIAGNOSIS — G479 Sleep disorder, unspecified: Secondary | ICD-10-CM

## 2011-08-15 DIAGNOSIS — F259 Schizoaffective disorder, unspecified: Secondary | ICD-10-CM | POA: Diagnosis not present

## 2011-08-15 DIAGNOSIS — F201 Disorganized schizophrenia: Secondary | ICD-10-CM | POA: Diagnosis not present

## 2011-08-15 DIAGNOSIS — G478 Other sleep disorders: Secondary | ICD-10-CM

## 2011-08-15 MED ORDER — TRAZODONE HCL 100 MG PO TABS: 300.0000 mg | ORAL_TABLET | Freq: Every day | ORAL | Status: DC

## 2011-08-15 MED ORDER — TRAZODONE HCL 100 MG PO TABS: ORAL_TABLET | ORAL | Status: DC

## 2011-08-15 MED ORDER — PALIPERIDONE ER 3 MG PO TB24: 3.0000 mg | ORAL_TABLET | ORAL | Status: DC

## 2011-08-15 NOTE — Progress Notes (Signed)
[facility] [id number] Progress Note  Sergio [name] [id number] 25 y.o.  08/15/2011 11:38 AM  Chief Complaint: I still hear the [place] but they don't bother me.   History of Present Illness: Patient is a 26 year old diagnosed with schizoaffective disorder, Down syndrome, GERD and hydrocephalus who presents today for a followup visit.  Mom reports that the clonidine was discontinued when the patient was in the hospital in February for pneumonia as his blood pressure was low. She adds that the blood pressure continues to be low and so she has been unable to restart the clonidine. She adds that[facility] contacted her and that they have a spot available and she is hoping that she can get him back in a [employer].  The patient continues to have the 3 friends but now also has an imaginary wife, girlfriend and  family. He is however able to follow directions, is sleeping at night but does have a difficult time in falling asleep. Discussed sleep hygiene and habits with mom. She adds that he's lost weight as they have locked up all the food and so he does not eat in the middle of the night  Suicidal Ideation: No Plan Formed: No Patient has means to carry out plan: No  Homicidal Ideation: No Plan Formed: No Patient has means to carry out plan: No  Review of Systems: Psychiatric: Agitation: No Hallucination: Yes Depressed Mood: No Insomnia: Yes Hypersomnia: No Altered Concentration: No Feels Worthless: No Grandiose Ideas: No Belief In Special Powers: Yes New/Increased Substance Abuse: No Compulsions: No  Neurologic: Headache: No Seizure: No Paresthesias: No  Past Medical Family, Social History:  Lives with parents in [facility]  Outpatient Encounter Prescriptions as of 08/15/2011  Medication Sig Dispense Refill  . cloNIDine (CATAPRES) 0.3 MG tablet TAKE ONE TABLET AT BEDTIME  30 tablet  2  . donepezil (ARICEPT) 5 MG tablet Take 5 mg by mouth at bedtime as  needed.      . flavoxATE (URISPAS) 100 MG tablet Take 1 tablet (100 mg total) by mouth 3 (three) times daily as needed (for bladder spasms).  30 tablet  0  . INVEGA 3 MG 24 hr tablet TAKE 1 TABLET IN MORNING  30 each  2  . omeprazole (PRILOSEC) 20 MG capsule Take 20 mg by mouth daily.      . traZODone (DESYREL) 100 MG tablet Take three tabs at bedtime.  90 tablet  3    Past Psychiatric History/Hospitalization(s): Anxiety: No Bipolar Disorder: No Depression: No Mania: No Psychosis: Yes Schizophrenia: Yes Personality Disorder: No Hospitalization for psychiatric illness: No History of Electroconvulsive Shock Therapy: No Prior Suicide Attempts: No  Physical Exam: Constitutional:  BP 104/60  Ht 5\' 1"  (1.549 m)  Wt 222 lb 6.4 oz (100.88 kg)  BMI 42.02 kg/m2  General Appearance: alert, oriented, no acute distress and obese  Musculoskeletal: Strength & Muscle Tone: within normal limits Gait & Station: broad based Patient leans: N/A  Psychiatric: Speech (describe rate, volume, coherence, spontaneity, and abnormalities if any):  Slow in rate but normal in volume and spontaneous  Thought Process (describe rate, content, abstract reasoning, and computation):  concrete  Associations: Relevant  Thoughts: delusions and hallucinations  Mental Status: Orientation: oriented to person and place Mood & Affect: normal affect Attention Span & Concentration:  OK  Medical Decision Making (Choose Three): Established Problem, Stable/Improving (1), Review of Psycho-Social Stressors (1), New Problem, with no additional work-up planned (3), Review of Last Therapy Session (1) and  Review of Medication Regimen & Side Effects (2)  Assessment: Axis I: schizo affective disorder, dementia  Axis II:  deferred  Axis III:  Down syndrome, GERD, status post pneumonia in February of 2013, hydrocephalus  Axis IV:  moderate  Axis V: 50   Plan:  continue Invega 3 mg 1 in the morning. Discussed  increasing the Invega but mom feels that it causes EPS and as she feels the patient is stable, is able to function she prefers to keep the dose at 3 mg Continue trazodone 300 mg at bedtime for sleep. Also discussed sleep habits in length at this visit  Discontinue clonidine as patient is no longer taking it secondary to his low blood pressure. His blood pressure was however stable at this visit. Continue diet modification along with Prilosec to help with patient's GERD Continue to help the food at night as the patient's lost more than 10 pounds in the last 2 months Mom to apply again for [facility] as the patient requires one-to-one attention  Call when necessary Followup in 2-3 months  Nelly Rout, MD 08/15/2011

## 2011-10-30 IMAGING — US US RENAL
1 series · 14 of 25 positions shown · non-contrast
Comparison: None.

CLINICAL DATA: Fever and pneumonia.

RENAL/URINARY TRACT ULTRASOUND COMPLETE

[Series 1: us renal · 0.28mm/px · 14 of 33 slices shown]
[im 1/33]
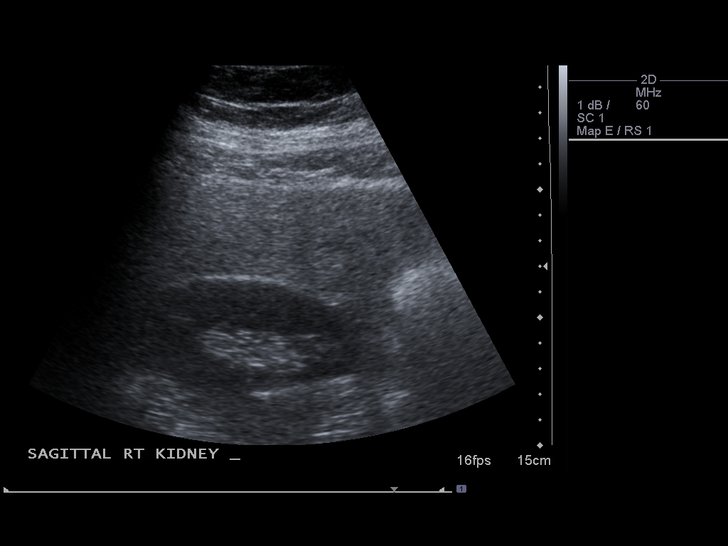
[im 3/33]
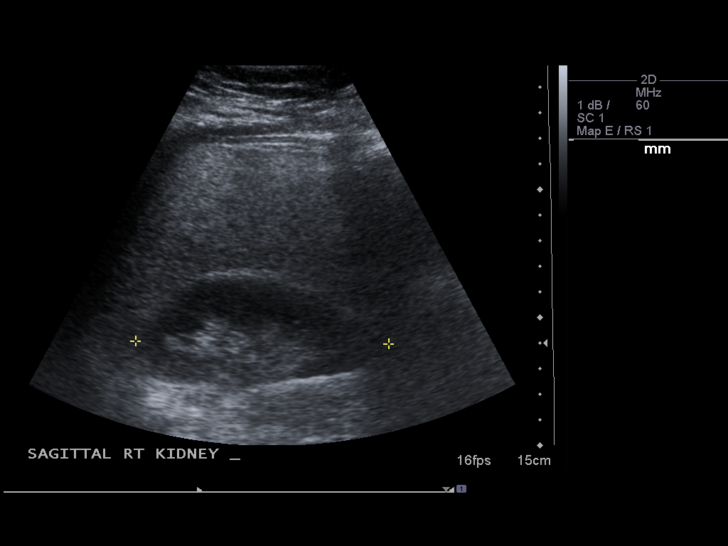
[im 6/33]
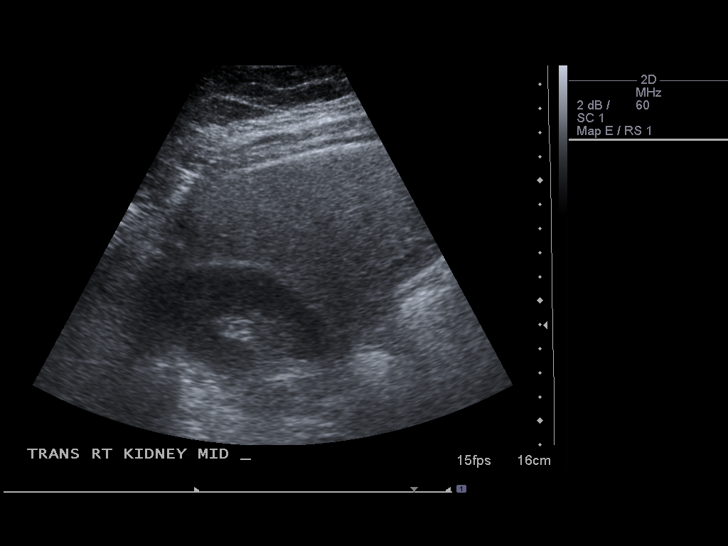
[im 9/33]
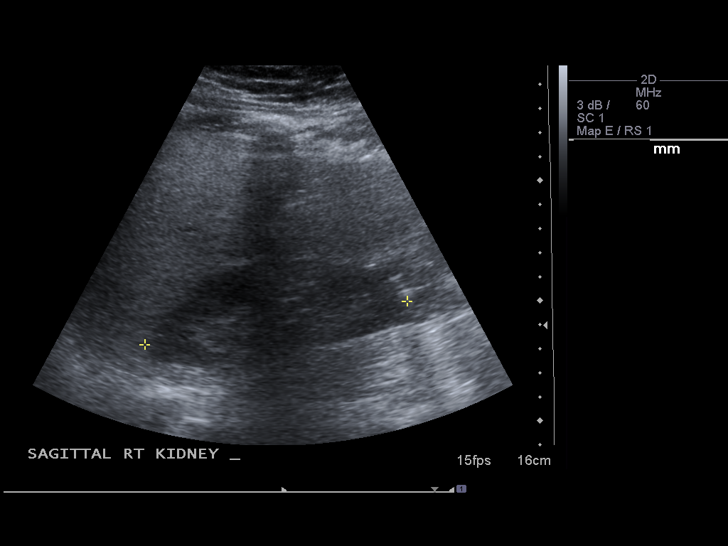
[im 11/33]
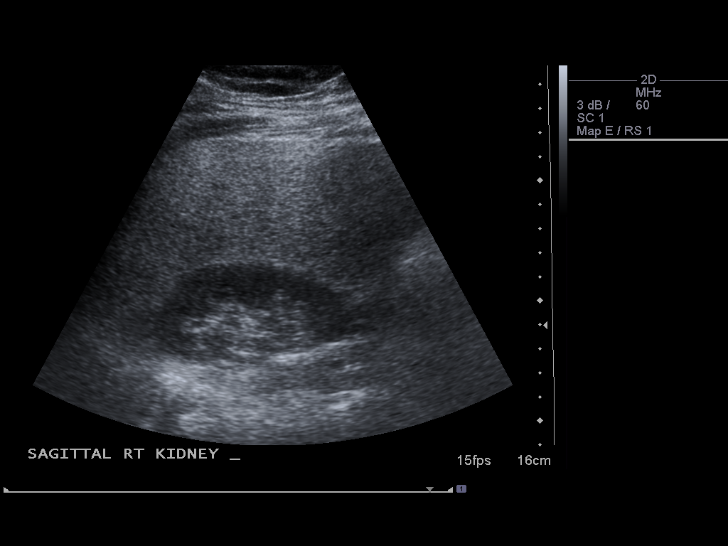
[im 13/33]
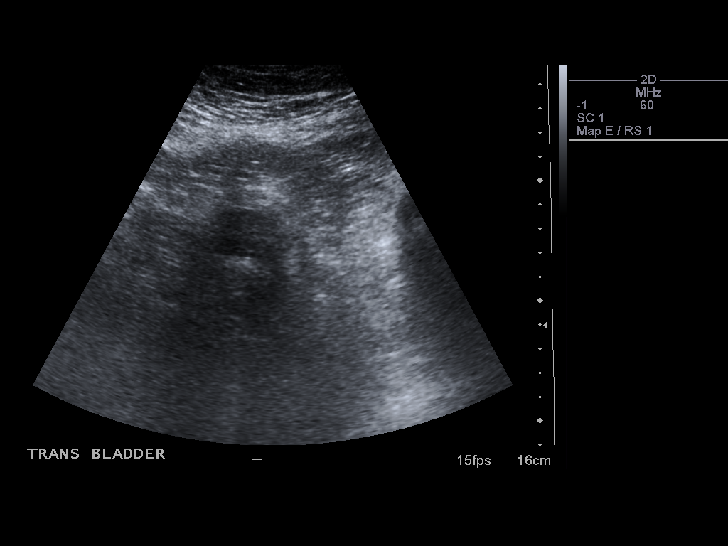
[im 15/33]
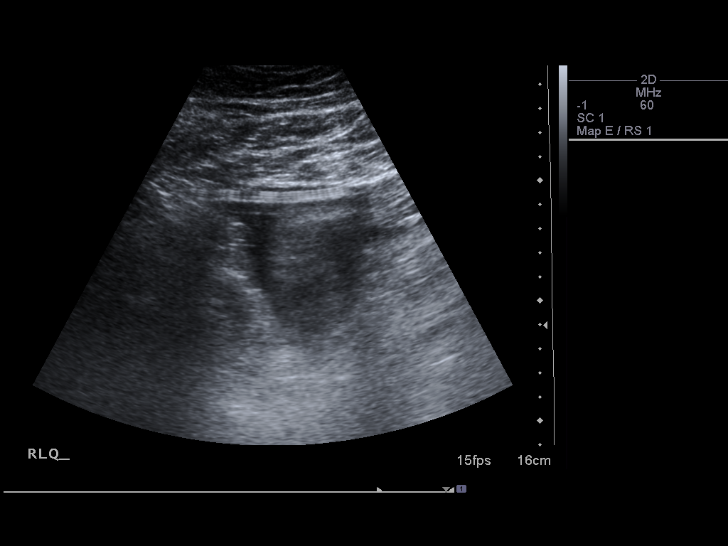
[im 18/33]
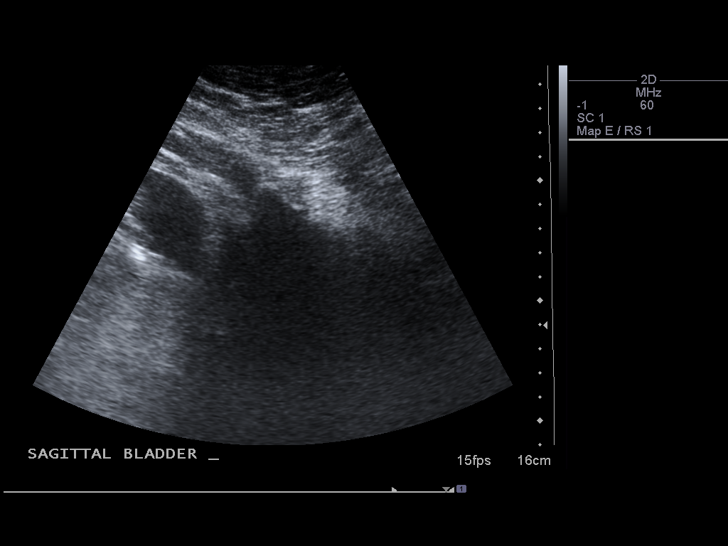
[im 21/33]
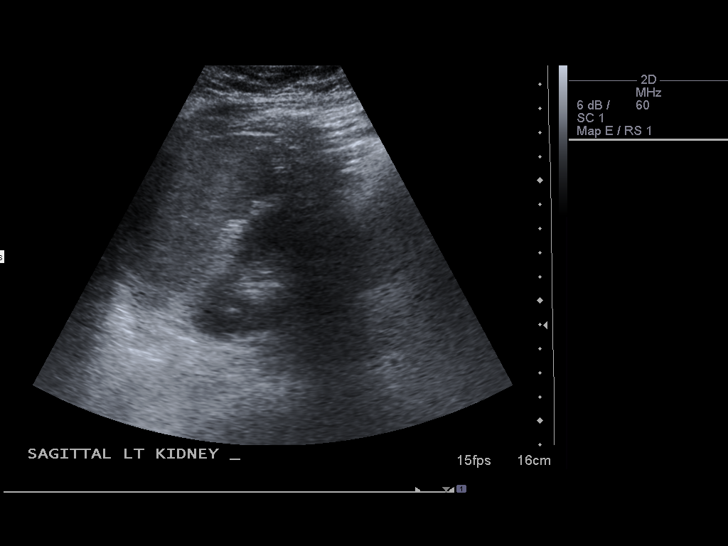
[im 22/33]
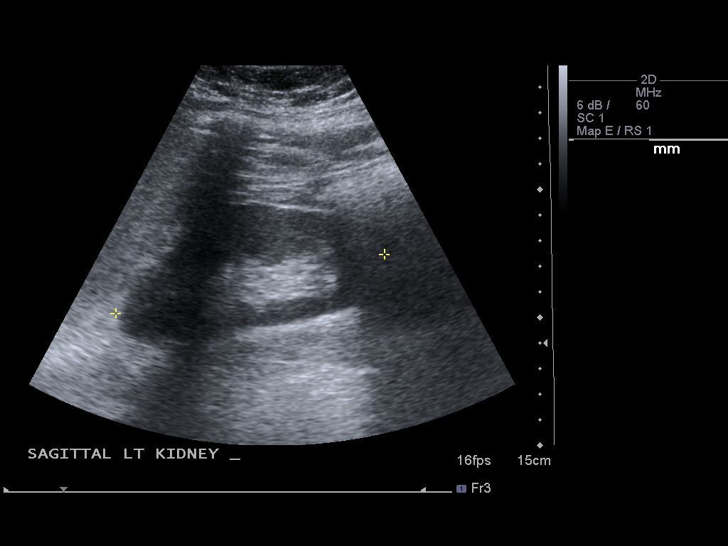
[im 25/33]
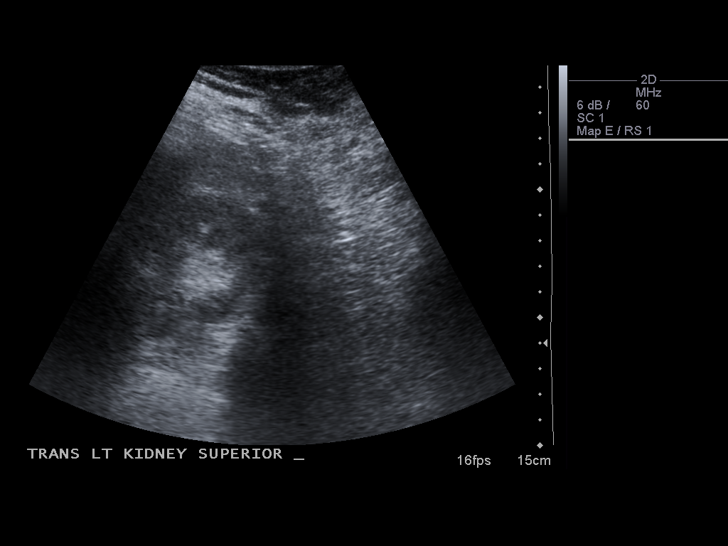
[im 27/33]
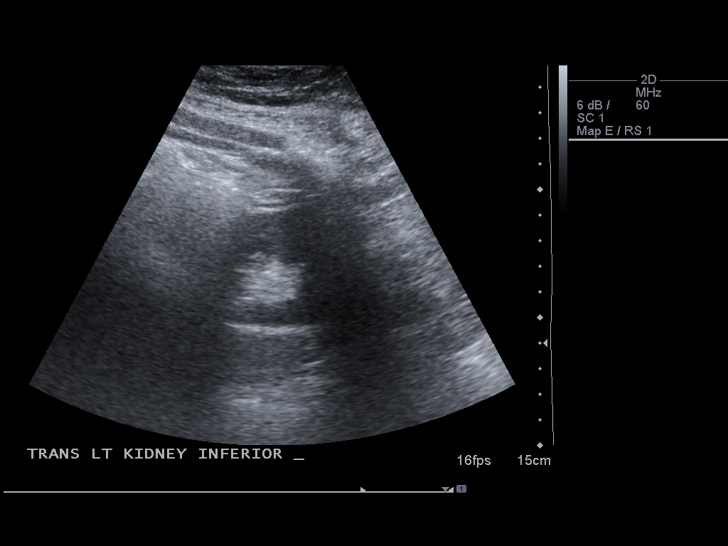
[im 30/33]
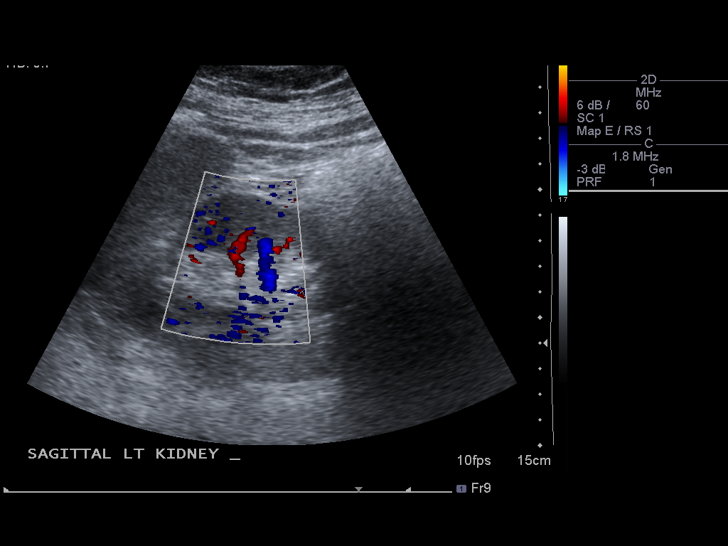
[im 33/33]
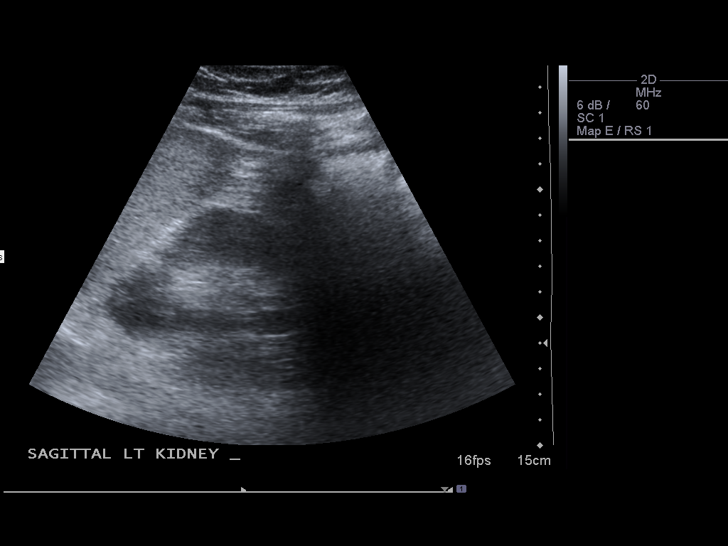

[14 of 25 positions shown; findings below may reference images not displayed]

FINDINGS: Right Kidney:  Measures 9.9 cm and appears normal.

Left Kidney:  Measures 11.1 cm and appears normal.

Bladder:  Foley catheter is in place.  Small amount of free fluid
is identified in the right lower quadrant.
IMPRESSION: Small amount of free fluid in the right lower quadrant is
nonspecific.  The kidneys appear normal.

## 2011-11-14 ENCOUNTER — Ambulatory Visit (HOSPITAL_COMMUNITY): Payer: Self-pay | Admitting: Psychiatry

## 2011-11-28 ENCOUNTER — Ambulatory Visit (HOSPITAL_COMMUNITY): Payer: Self-pay | Admitting: Psychiatry

## 2011-11-28 DIAGNOSIS — J209 Acute bronchitis, unspecified: Secondary | ICD-10-CM | POA: Diagnosis not present

## 2011-11-28 DIAGNOSIS — H65199 Other acute nonsuppurative otitis media, unspecified ear: Secondary | ICD-10-CM | POA: Diagnosis not present

## 2011-12-03 DIAGNOSIS — J40 Bronchitis, not specified as acute or chronic: Secondary | ICD-10-CM | POA: Diagnosis not present

## 2012-01-07 ENCOUNTER — Other Ambulatory Visit (HOSPITAL_COMMUNITY): Payer: Self-pay | Admitting: Psychiatry

## 2012-01-16 ENCOUNTER — Ambulatory Visit (INDEPENDENT_AMBULATORY_CARE_PROVIDER_SITE_OTHER): Payer: Medicare Other | Admitting: Psychiatry

## 2012-01-16 ENCOUNTER — Encounter (HOSPITAL_COMMUNITY): Payer: Self-pay | Admitting: Psychiatry

## 2012-01-16 VITALS — BP 115/60 | Ht 61.0 in | Wt 212.2 lb

## 2012-01-16 DIAGNOSIS — F0390 Unspecified dementia without behavioral disturbance: Secondary | ICD-10-CM

## 2012-01-16 DIAGNOSIS — G479 Sleep disorder, unspecified: Secondary | ICD-10-CM

## 2012-01-16 DIAGNOSIS — F259 Schizoaffective disorder, unspecified: Secondary | ICD-10-CM | POA: Diagnosis not present

## 2012-01-16 MED ORDER — TRAZODONE HCL 100 MG PO TABS: 300.0000 mg | ORAL_TABLET | Freq: Every day | ORAL | Status: DC

## 2012-01-16 MED ORDER — PALIPERIDONE ER 3 MG PO TB24: 3.0000 mg | ORAL_TABLET | Freq: Every day | ORAL | Status: DC

## 2012-01-16 NOTE — Progress Notes (Signed)
Patient ID: Sergio [name], male   DOB: 07/23/1985, 26 y.o.   MRN: [id number]  [facility] [address] Progress Note  Sergio [name] [id number] 26 y.o.  01/16/2012 10:50 AM  Chief Complaint: I am doing well, I've lost weight  History of Present Illness: Patient is a 26 year old diagnosed with schizoaffective disorder, Down syndrome, GERD and hydrocephalus who presents today for a followup visit.  Dad reports that they are giving all his medications at night and that the patient has been doing fairly well. He also attends an adult center on Fridays and adds that they're trying to get funding to add another day. Patient says that he still hears hears voices whom he calls him " Weirdo's". He adds that they do not bother him and he can ignore them now. He says he sleeping well at night but does wake up early. Dad says that he's lost weight as the food is locked up so he cannot get up in the middle of the night and eat. He denies any side effects of the medications, any safety issues at this visit   Suicidal Ideation: No Plan Formed: No Patient has means to carry out plan: No  Homicidal Ideation: No Plan Formed: No Patient has means to carry out plan: No  Review of Systems: Psychiatric: Agitation: No Hallucination: Yes Depressed Mood: No Insomnia: Yes Hypersomnia: No Altered Concentration: No Feels Worthless: No Grandiose Ideas: No Belief In Special Powers: Yes New/Increased Substance Abuse: No Compulsions: No  Neurologic: Headache: No Seizure: No Paresthesias: No  Past Medical Family, Social History:  Lives with parents in [facility]  Outpatient Encounter Prescriptions as of 01/16/2012  Medication Sig Dispense Refill  . donepezil (ARICEPT) 5 MG tablet Take 5 mg by mouth at bedtime as needed.      . flavoxATE (URISPAS) 100 MG tablet Take 1 tablet (100 mg total) by mouth 3 (three) times daily as needed (for bladder spasms).  30 tablet  0  . omeprazole (PRILOSEC) 20 MG  capsule Take 20 mg by mouth daily.      . paliperidone (INVEGA) 3 MG 24 hr tablet Take 1 tablet (3 mg total) by mouth at bedtime.  30 tablet  2  . traZODone (DESYREL) 100 MG tablet Take 3 tablets (300 mg total) by mouth at bedtime.  90 tablet  3  . DISCONTD: INVEGA 3 MG 24 hr tablet TAKE 1 TABLET IN MORNING  30 each  2  . DISCONTD: traZODone (DESYREL) 100 MG tablet Take 3 tablets (300 mg total) by mouth at bedtime.  90 tablet  3    Past Psychiatric History/Hospitalization(s): Anxiety: No Bipolar Disorder: No Depression: No Mania: No Psychosis: Yes Schizophrenia: Yes Personality Disorder: No Hospitalization for psychiatric illness: No History of Electroconvulsive Shock Therapy: No Prior Suicide Attempts: No  Physical Exam: Constitutional:  BP 115/60  Ht 5\' 1"  (1.549 m)  Wt 212 lb 3.2 oz (96.253 kg)  BMI 40.09 kg/m2  General Appearance: alert, oriented, no acute distress and obese  Musculoskeletal: Strength & Muscle Tone: within normal limits Gait & Station: broad based Patient leans: N/A  Psychiatric: Speech (describe rate, volume, coherence, spontaneity, and abnormalities if any):  Slow in rate but normal in volume and spontaneous  Thought Process (describe rate, content, abstract reasoning, and computation):  concrete  Associations: Relevant  Thoughts: delusions and hallucinations  Mental Status: Orientation: oriented to person and place Mood & Affect: normal affect Attention Span & Concentration:  OK  Medical Decision Making (Choose  Three): Established Problem, Stable/Improving (1), Review of Psycho-Social Stressors (1), Review of Last Therapy Session (1) and Review of Medication Regimen & Side Effects (2)  Assessment: Axis I: schizo affective disorder, dementia  Axis II:  deferred  Axis III:  Down syndrome, GERD, status post pneumonia in February of 2013, hydrocephalus  Axis IV:  moderate  Axis V: 50   Plan:  Continue Invega 3 mg but change to one at  night for psychosis Continue trazodone 300 mg at bedtime for sleep. Also discussed sleep habits in length at this visit  Continue diet modification along with Prilosec to help with patient's GERD Continue to help the food at night as the patient's lost more than 10 pounds in the last 2 months Call when necessary Followup in 3 months  Nelly Rout, MD 01/16/2012

## 2012-02-18 DIAGNOSIS — J40 Bronchitis, not specified as acute or chronic: Secondary | ICD-10-CM | POA: Diagnosis not present

## 2012-03-25 DIAGNOSIS — J45909 Unspecified asthma, uncomplicated: Secondary | ICD-10-CM | POA: Diagnosis not present

## 2012-03-27 DIAGNOSIS — J209 Acute bronchitis, unspecified: Secondary | ICD-10-CM | POA: Diagnosis not present

## 2012-04-01 ENCOUNTER — Other Ambulatory Visit (HOSPITAL_COMMUNITY): Payer: Self-pay | Admitting: Psychiatry

## 2012-04-15 DIAGNOSIS — Z23 Encounter for immunization: Secondary | ICD-10-CM | POA: Diagnosis not present

## 2012-04-16 ENCOUNTER — Ambulatory Visit (HOSPITAL_COMMUNITY): Payer: Self-pay | Admitting: Psychiatry

## 2012-04-21 DIAGNOSIS — J45909 Unspecified asthma, uncomplicated: Secondary | ICD-10-CM | POA: Diagnosis not present

## 2012-05-12 ENCOUNTER — Ambulatory Visit (HOSPITAL_COMMUNITY): Payer: Self-pay | Admitting: Psychiatry

## 2012-05-28 ENCOUNTER — Encounter (HOSPITAL_COMMUNITY): Payer: Self-pay | Admitting: Psychiatry

## 2012-05-28 ENCOUNTER — Ambulatory Visit (INDEPENDENT_AMBULATORY_CARE_PROVIDER_SITE_OTHER): Payer: Medicare Other | Admitting: Psychiatry

## 2012-05-28 VITALS — BP 92/80 | Ht 61.0 in | Wt 213.0 lb

## 2012-05-28 DIAGNOSIS — F259 Schizoaffective disorder, unspecified: Secondary | ICD-10-CM

## 2012-05-28 DIAGNOSIS — F0390 Unspecified dementia without behavioral disturbance: Secondary | ICD-10-CM | POA: Diagnosis not present

## 2012-05-28 DIAGNOSIS — G479 Sleep disorder, unspecified: Secondary | ICD-10-CM

## 2012-05-28 MED ORDER — PALIPERIDONE ER 3 MG PO TB24: 3.0000 mg | ORAL_TABLET | Freq: Every day | ORAL | Status: DC

## 2012-05-28 MED ORDER — TRAZODONE HCL 100 MG PO TABS: 300.0000 mg | ORAL_TABLET | Freq: Every day | ORAL | Status: DC

## 2012-06-03 NOTE — Progress Notes (Signed)
Patient ID: Sergio [name], male   DOB: 01-Dec-1985, 27 y.o.   MRN: [id number]  [facility] [address] Progress Note  Sergio [name] [id number] 27 y.o.  06/03/2012 6:59 PM  Chief Complaint: I am doing well, I have been going regularly to the adult day center  History of Present Illness: Patient is a 27 year old diagnosed with schizoaffective disorder, Down syndrome, GERD and hydrocephalus who presents today for a followup visit.  Mom reports that the patient has been doing fairly well. He also attends an adult center 3-4 times a week as he now has  [employer] and adds that she is his home health aide and she looks after him. She adds that it gives them some extra income which is helpful. Patient is still sleeping well at night but does wake up early. She also reports that the monitor his food intake closely as the patient has been doing well in regards to not gaining significant weight  She denies any side effects of the medications, any safety issues at this visit   Suicidal Ideation: No Plan Formed: No Patient has means to carry out plan: No  Homicidal Ideation: No Plan Formed: No Patient has means to carry out plan: No  Review of Systems: Psychiatric: Agitation: No Hallucination: Yes Depressed Mood: No Insomnia: Yes Hypersomnia: No Altered Concentration: No Feels Worthless: No Grandiose Ideas: No Belief In Special Powers: Yes New/Increased Substance Abuse: No Compulsions: No Cardiovascular ROS: no chest pain or dyspnea on exertion Neurologic: Headache: No Seizure: No Paresthesias: No  Past Medical Family, Social History:  Lives with parents in [facility]  Outpatient Encounter Prescriptions as of 05/28/2012  Medication Sig Dispense Refill  . donepezil (ARICEPT) 5 MG tablet Take 5 mg by mouth at bedtime as needed.      . flavoxATE (URISPAS) 100 MG tablet Take 1 tablet (100 mg total) by mouth 3 (three) times daily as needed (for bladder spasms).  30 tablet  0   . omeprazole (PRILOSEC) 20 MG capsule Take 20 mg by mouth daily.      . paliperidone (INVEGA) 3 MG 24 hr tablet Take 1 tablet (3 mg total) by mouth at bedtime.  30 tablet  3  . traZODone (DESYREL) 100 MG tablet Take 3 tablets (300 mg total) by mouth at bedtime.  90 tablet  3  . [DISCONTINUED] paliperidone (INVEGA) 3 MG 24 hr tablet Take 1 tablet (3 mg total) by mouth at bedtime.  30 tablet  1  . [DISCONTINUED] traZODone (DESYREL) 100 MG tablet Take 3 tablets (300 mg total) by mouth at bedtime.  90 tablet  3    Past Psychiatric History/Hospitalization(s): Anxiety: No Bipolar Disorder: No Depression: No Mania: No Psychosis: Yes Schizophrenia: Yes Personality Disorder: No Hospitalization for psychiatric illness: No History of Electroconvulsive Shock Therapy: No Prior Suicide Attempts: No  Physical Exam: Constitutional:  BP 92/80  Ht 5\' 1"  (1.549 m)  Wt 213 lb (96.616 kg)  BMI 40.25 kg/m2  General Appearance: alert, oriented, no acute distress and obese  Musculoskeletal: Strength & Muscle Tone: within normal limits Gait & Station: broad based Patient leans: N/A  Psychiatric: Speech (describe rate, volume, coherence, spontaneity, and abnormalities if any):  Slow in rate but normal in volume and spontaneous  Thought Process (describe rate, content, abstract reasoning, and computation):  concrete  Associations: Relevant  Thoughts: delusions and hallucinations  Mental Status: Orientation: oriented to person and place Mood & Affect: normal affect Attention Span & Concentration:  OK Cognition  patient is intellectually limited Recent and remote memories are appropriate for patient's intellectual ability Insight and judgment seems to fluctuate between fair to poor as patient is intellectually limited  Medical Decision Making (Choose Three): Established Problem, Stable/Improving (1), Review of Psycho-Social Stressors (1), Review of Last Therapy Session (1) and Review of  Medication Regimen & Side Effects (2)  Assessment: Axis I: schizo affective disorder, dementia  Axis II:  deferred  Axis III:  Down syndrome, GERD, status post pneumonia in February of 2013, hydrocephalus  Axis IV:  moderate  Axis V: 50   Plan:  Continue Invega 3 mg but change to one at night for psychosis Continue trazodone 300 mg at bedtime for sleep. Also discussed sleep habits in length at this visit  Continue diet modification along with Prilosec to help with patient's GERD Continue to attend adult day center Call when necessary Followup in 3 months  Nelly Rout, MD 06/03/2012

## 2012-06-26 DIAGNOSIS — J45909 Unspecified asthma, uncomplicated: Secondary | ICD-10-CM | POA: Diagnosis not present

## 2012-07-02 ENCOUNTER — Inpatient Hospital Stay (HOSPITAL_COMMUNITY)
Admission: EM | Admit: 2012-07-02 | Discharge: 2012-07-05 | DRG: 195 | Disposition: A | Discharge status: Home / Self Care | Payer: Medicare Other | Attending: Internal Medicine | Admitting: Internal Medicine

## 2012-07-02 ENCOUNTER — Emergency Department (HOSPITAL_COMMUNITY): Payer: Medicare Other

## 2012-07-02 ENCOUNTER — Encounter (HOSPITAL_COMMUNITY): Payer: Self-pay

## 2012-07-02 DIAGNOSIS — F209 Schizophrenia, unspecified: Secondary | ICD-10-CM | POA: Diagnosis present

## 2012-07-02 DIAGNOSIS — F259 Schizoaffective disorder, unspecified: Secondary | ICD-10-CM | POA: Diagnosis present

## 2012-07-02 DIAGNOSIS — Z79899 Other long term (current) drug therapy: Secondary | ICD-10-CM

## 2012-07-02 DIAGNOSIS — Z87898 Personal history of other specified conditions: Secondary | ICD-10-CM | POA: Diagnosis not present

## 2012-07-02 DIAGNOSIS — R062 Wheezing: Secondary | ICD-10-CM

## 2012-07-02 DIAGNOSIS — D72829 Elevated white blood cell count, unspecified: Secondary | ICD-10-CM | POA: Diagnosis present

## 2012-07-02 DIAGNOSIS — K219 Gastro-esophageal reflux disease without esophagitis: Secondary | ICD-10-CM | POA: Diagnosis present

## 2012-07-02 DIAGNOSIS — J209 Acute bronchitis, unspecified: Secondary | ICD-10-CM | POA: Diagnosis not present

## 2012-07-02 DIAGNOSIS — J189 Pneumonia, unspecified organism: Secondary | ICD-10-CM | POA: Diagnosis not present

## 2012-07-02 DIAGNOSIS — G479 Sleep disorder, unspecified: Secondary | ICD-10-CM

## 2012-07-02 DIAGNOSIS — R05 Cough: Secondary | ICD-10-CM | POA: Diagnosis not present

## 2012-07-02 DIAGNOSIS — R059 Cough, unspecified: Secondary | ICD-10-CM | POA: Diagnosis not present

## 2012-07-02 DIAGNOSIS — Q909 Down syndrome, unspecified: Secondary | ICD-10-CM | POA: Diagnosis not present

## 2012-07-02 DIAGNOSIS — R0602 Shortness of breath: Secondary | ICD-10-CM | POA: Diagnosis not present

## 2012-07-02 DIAGNOSIS — Z982 Presence of cerebrospinal fluid drainage device: Secondary | ICD-10-CM

## 2012-07-02 LAB — CBC WITH DIFFERENTIAL/PLATELET
Basophils Absolute: 0.1 10*3/uL (ref 0.0–0.1)
Basophils Relative: 0 % (ref 0–1)
Eosinophils Absolute: 0 10*3/uL (ref 0.0–0.7)
Eosinophils Relative: 0 % (ref 0–5)
HCT: 44.7 % (ref 39.0–52.0)
Hemoglobin: 15.2 g/dL (ref 13.0–17.0)
Lymphocytes Relative: 15 % (ref 12–46)
Lymphs Abs: 1.8 10*3/uL (ref 0.7–4.0)
MCH: 31.8 pg (ref 26.0–34.0)
MCHC: 34 g/dL (ref 30.0–36.0)
MCV: 93.5 fL (ref 78.0–100.0)
Monocytes Absolute: 0.6 10*3/uL (ref 0.1–1.0)
Monocytes Relative: 5 % (ref 3–12)
Neutro Abs: 10 10*3/uL — ABNORMAL HIGH (ref 1.7–7.7)
Neutrophils Relative %: 80 % — ABNORMAL HIGH (ref 43–77)
Platelets: 221 10*3/uL (ref 150–400)
RBC: 4.78 MIL/uL (ref 4.22–5.81)
RDW: 14.6 % (ref 11.5–15.5)
WBC: 12.5 10*3/uL — ABNORMAL HIGH (ref 4.0–10.5)

## 2012-07-02 LAB — CREATININE, SERUM: Creatinine, Ser: 1.12 mg/dL (ref 0.50–1.35)

## 2012-07-02 LAB — URINALYSIS, ROUTINE W REFLEX MICROSCOPIC
Bilirubin Urine: NEGATIVE
Glucose, UA: NEGATIVE mg/dL
Hgb urine dipstick: NEGATIVE
Ketones, ur: NEGATIVE mg/dL
Leukocytes, UA: NEGATIVE
Nitrite: NEGATIVE
Protein, ur: NEGATIVE mg/dL
Specific Gravity, Urine: 1.031 — ABNORMAL HIGH (ref 1.005–1.030)
Urobilinogen, UA: 0.2 mg/dL (ref 0.0–1.0)
pH: 5.5 (ref 5.0–8.0)

## 2012-07-02 LAB — CBC
MCH: 30.9 pg (ref 26.0–34.0)
MCHC: 32.6 g/dL (ref 30.0–36.0)
Platelets: 201 10*3/uL (ref 150–400)
RBC: 4.5 MIL/uL (ref 4.22–5.81)

## 2012-07-02 LAB — BASIC METABOLIC PANEL
BUN: 14 mg/dL (ref 6–23)
CO2: 24 mEq/L (ref 19–32)
Calcium: 8.8 mg/dL (ref 8.4–10.5)
Chloride: 101 mEq/L (ref 96–112)
Creatinine, Ser: 1.12 mg/dL (ref 0.50–1.35)
GFR calc Af Amer: >90 mL/min (ref >90)
GFR calc non Af Amer: 89 mL/min — ABNORMAL LOW (ref >90)
Glucose, Bld: 129 mg/dL — ABNORMAL HIGH (ref 70–99)
Potassium: 3.9 mEq/L (ref 3.5–5.1)
Sodium: 135 mEq/L (ref 135–145)

## 2012-07-02 MED ORDER — ACETAMINOPHEN 325 MG PO TABS
650.0000 mg | ORAL_TABLET | Freq: Four times a day (QID) | ORAL | Status: DC | PRN
  Administered 2012-07-02: 650 mg via ORAL
  Filled 2012-07-02: qty 2

## 2012-07-02 MED ORDER — ALBUTEROL SULFATE (5 MG/ML) 0.5% IN NEBU
2.5000 mg | INHALATION_SOLUTION | Freq: Four times a day (QID) | RESPIRATORY_TRACT | Status: DC
  Administered 2012-07-02 – 2012-07-05 (×11): 2.5 mg via RESPIRATORY_TRACT
  Filled 2012-07-02 (×11): qty 0.5

## 2012-07-02 MED ORDER — ACETAMINOPHEN 650 MG RE SUPP: 650.0000 mg | Freq: Four times a day (QID) | RECTAL | Status: DC | PRN

## 2012-07-02 MED ORDER — PALIPERIDONE ER 3 MG PO TB24
3.0000 mg | ORAL_TABLET | Freq: Every day | ORAL | Status: DC
  Administered 2012-07-02 – 2012-07-04 (×3): 3 mg via ORAL
  Filled 2012-07-02 (×4): qty 1

## 2012-07-02 MED ORDER — ENOXAPARIN SODIUM 40 MG/0.4ML ~~LOC~~ SOLN
40.0000 mg | SUBCUTANEOUS | Status: DC
  Administered 2012-07-02 – 2012-07-04 (×3): 40 mg via SUBCUTANEOUS
  Filled 2012-07-02 (×4): qty 0.4

## 2012-07-02 MED ORDER — DONEPEZIL HCL 5 MG PO TABS
5.0000 mg | ORAL_TABLET | Freq: Every day | ORAL | Status: DC
  Administered 2012-07-02 – 2012-07-04 (×3): 5 mg via ORAL
  Filled 2012-07-02 (×4): qty 1

## 2012-07-02 MED ORDER — ALBUTEROL (5 MG/ML) CONTINUOUS INHALATION SOLN
10.0000 mg/h | INHALATION_SOLUTION | RESPIRATORY_TRACT | Status: AC
  Administered 2012-07-02: 10 mg/h via RESPIRATORY_TRACT
  Filled 2012-07-02: qty 100

## 2012-07-02 MED ORDER — OSELTAMIVIR PHOSPHATE 75 MG PO CAPS
75.0000 mg | ORAL_CAPSULE | Freq: Two times a day (BID) | ORAL | Status: DC
  Administered 2012-07-02 – 2012-07-03 (×2): 75 mg via ORAL
  Filled 2012-07-02 (×3): qty 1

## 2012-07-02 MED ORDER — METHYLPREDNISOLONE SODIUM SUCC 125 MG IJ SOLR
80.0000 mg | Freq: Four times a day (QID) | INTRAMUSCULAR | Status: DC
  Administered 2012-07-02 – 2012-07-05 (×11): 80 mg via INTRAVENOUS
  Filled 2012-07-02 (×15): qty 1.28

## 2012-07-02 MED ORDER — ALBUTEROL SULFATE HFA 108 (90 BASE) MCG/ACT IN AERS
2.0000 | INHALATION_SPRAY | Freq: Four times a day (QID) | RESPIRATORY_TRACT | Status: DC
  Filled 2012-07-02: qty 6.7

## 2012-07-02 MED ORDER — GUAIFENESIN-DM 100-10 MG/5ML PO SYRP: 5.0000 mL | ORAL_SOLUTION | ORAL | Status: DC | PRN

## 2012-07-02 MED ORDER — ONDANSETRON HCL 4 MG PO TABS: 4.0000 mg | ORAL_TABLET | Freq: Four times a day (QID) | ORAL | Status: DC | PRN

## 2012-07-02 MED ORDER — TRAZODONE HCL 150 MG PO TABS
300.0000 mg | ORAL_TABLET | Freq: Every day | ORAL | Status: DC
  Administered 2012-07-02 – 2012-07-04 (×3): 300 mg via ORAL
  Filled 2012-07-02 (×4): qty 2

## 2012-07-02 MED ORDER — SODIUM CHLORIDE 0.9 % IV SOLN
INTRAVENOUS | Status: DC
  Administered 2012-07-03 – 2012-07-04 (×2): via INTRAVENOUS
  Administered 2012-07-04: 1000 mL via INTRAVENOUS

## 2012-07-02 MED ORDER — ONDANSETRON HCL 4 MG/2ML IJ SOLN: 4.0000 mg | Freq: Four times a day (QID) | INTRAMUSCULAR | Status: DC | PRN

## 2012-07-02 MED ORDER — MELATONIN 3 MG PO TABS: 1.0000 | ORAL_TABLET | Freq: Every evening | ORAL | Status: DC | PRN

## 2012-07-02 MED ORDER — ALBUTEROL SULFATE (5 MG/ML) 0.5% IN NEBU: 2.5000 mg | INHALATION_SOLUTION | RESPIRATORY_TRACT | Status: DC | PRN

## 2012-07-02 MED ORDER — PANTOPRAZOLE SODIUM 40 MG PO TBEC
40.0000 mg | DELAYED_RELEASE_TABLET | Freq: Every day | ORAL | Status: DC
  Administered 2012-07-03 – 2012-07-05 (×3): 40 mg via ORAL
  Filled 2012-07-02 (×3): qty 1

## 2012-07-02 MED ORDER — LEVOFLOXACIN IN D5W 750 MG/150ML IV SOLN
750.0000 mg | Freq: Once | INTRAVENOUS | Status: AC
  Administered 2012-07-02: 750 mg via INTRAVENOUS
  Filled 2012-07-02: qty 150

## 2012-07-02 MED ORDER — LEVOFLOXACIN 750 MG PO TABS
750.0000 mg | ORAL_TABLET | Freq: Every day | ORAL | Status: DC
  Administered 2012-07-03 – 2012-07-05 (×3): 750 mg via ORAL
  Filled 2012-07-02 (×3): qty 1

## 2012-07-02 MED ORDER — ALBUTEROL SULFATE (5 MG/ML) 0.5% IN NEBU
2.5000 mg | INHALATION_SOLUTION | Freq: Once | RESPIRATORY_TRACT | Status: AC
  Administered 2012-07-02: 2.5 mg via RESPIRATORY_TRACT
  Filled 2012-07-02: qty 0.5

## 2012-07-02 NOTE — ED Notes (Signed)
Pt's father states the past week pt has had wheezing, cough/congestion, states went to PCP this past Friday and was placed on Z-placed and inhaler, father states pt has gotten worse instead of better, pt wheezing w/ shortness of breath, pts father states every winter pt gets a URI.

## 2012-07-02 NOTE — H&P (Addendum)
[facility] History and Physical  Sergio [name] UJW:[id number] DOB: [birth year] DOA: 07/02/2012  Referring physician: ED PCP: Rudi Heap, MD   Chief Complaint:   cough and runny nose for past one week Fever since 1 day  HPI:  27 year old male with Down syndrome, schizoaffective disorder, GERD who had symptoms off cough with chest congestion and runny nose for past one week presented to the ED with worsening of symptoms and associated fever for one to 2 days. History apparently provided by his father at bedside. Patient had URI symptoms associated with cough and wheezing for which he was seen by his primary care 5 days back and prescribed a course of Z-Pak and albuterol inhaler. Symptoms however worsened progressively and patient had a temperature of 1028F yesterday. He also had poor appetite, with gagging and wheezing. Patient complained of chest discomfort with coughing. In the ED he had a temperature of 100.28F. Patient dropped his sats to 80s on RA. Labs sent showed a WBC of 12.5 K. A chest x-ray done showed mild to moderate interstitial thickening. Patient quite wheezy and given 2 rounds of the nebs without much improvement. [facility] called for admission to medical floor for possible community-acquired pneumonia associated with bronchospasms. Patient does not have a history of asthma. On questioning father is not sure if patient has had a flu shot this season.    Review of Systems: (Positive symptoms in bold) Constitutional: Positive for fever, chills,  appetite change and fatigue.  HEENT:congestion, sore throat, rhinorrhea, sneezing, Denies photophobia, eye pain, redness, hearing loss, ear pain,  mouth sores, trouble swallowing, neck pain, neck stiffness and tinnitus.   Respiratory:  SOB,  cough, chest tightness,  and wheezing.   Cardiovascular: Denies chest pain, palpitations and leg swelling.  Gastrointestinal:  nausea,Denies vomiting, abdominal pain, diarrhea,  constipation, blood in stool and abdominal distention.  Genitourinary: Denies dysuria, urgency, frequency, hematuria, flank pain and difficulty urinating.  Musculoskeletal: Denies myalgias, back pain, joint swelling, arthralgias and gait problem.  Skin: Denies pallor, rash and wound.  Neurological: Denies dizziness, seizures, syncope, weakness, light-headedness, numbness and headaches.  Hematological: Denies adenopathy. Easy bruising, personal or family bleeding history  Psychiatric/Behavioral: Denies suicidal ideation, mood changes, confusion, nervousness, sleep disturbance and agitation   Past Medical History  Diagnosis Date  . Down's syndrome   . GERD (gastroesophageal reflux disease)   . Hydrocephalus   . Schizoaffective disorder    Past Surgical History  Procedure Laterality Date  . Csf shunt    . Brain surgery     Social History:  reports that he has never smoked. He does not have any smokeless tobacco history on file. He reports that he does not drink alcohol or use illicit drugs.  Allergies  Allergen Reactions  . Ceclor (Cefaclor) Anaphylaxis  . Penicillins Anaphylaxis    Family History  Problem Relation Age of Onset  . Hypertension Mother   . Renal Disease Father     Prior to Admission medications   Medication Sig Start Date End Date Taking? Authorizing Provider  benzonatate (TESSALON) 200 MG capsule Take 200 mg by mouth 3 (three) times daily as needed for cough.   Yes Historical Provider, MD  donepezil (ARICEPT) 5 MG tablet Take 5 mg by mouth at bedtime.    Yes Historical Provider, MD  Melatonin 3 MG TABS Take 1 tablet by mouth at bedtime as needed (sleep).   Yes Historical Provider, MD  omeprazole (PRILOSEC) 20 MG capsule Take 20 mg by mouth daily.  Yes Historical Provider, MD  paliperidone (INVEGA) 3 MG 24 hr tablet Take 3 mg by mouth at bedtime. 05/28/12  Yes Nelly Rout, MD  traZODone (DESYREL) 100 MG tablet Take 300 mg by mouth at bedtime. 05/28/12  Yes  Nelly Rout, MD    Physical Exam:  Filed Vitals:   07/02/12 1302 07/02/12 1438 07/02/12 1531  BP: 128/65  128/68  Pulse: 98  108  Temp: 100 F (37.8 C)  100.2 F (37.9 C)  TempSrc: Oral  Oral  Resp: 22  28  SpO2: 91% 95% 90%    Constitutional: Vital signs reviewed.  Patient is an obese young male in no acute distress and cooperative with exam. Alert and oriented x3.  Head: Normocephalic and atraumatic Ear: TM normal bilaterally Mouth: no erythema or exudates, MMM Eyes: PERRL, EOMI, conjunctivae normal, No scleral icterus.  Neck: Supple, Trachea midline normal ROM, No JVD, mass, thyromegaly, or carotid bruit present.  Cardiovascular: RRR, S1 normal, S2 normal, no MRG, pulses symmetric and intact bilaterally Pulmonary/Chest: Bilateral extensive wheezing, right basilar crackles Abdominal: Soft. Non-tender, non-distended, bowel sounds are normal, no masses, organomegaly, or guarding present.  GU: no CVA tenderness Musculoskeletal: No joint deformities, erythema, or stiffness, ROM full and no nontender Ext: no edema and no cyanosis, pulses palpable bilaterally (DP and PT) Hematology: no cervical, inginal, or axillary adenopathy.  Neurological: A&O x2, Strength is normal and symmetric bilaterally, cranial nerve II-XII are grossly intact, no focal motor deficit, sensory intact to light touch bilaterally.  Skin: Warm, dry and intact. No rash, cyanosis, or clubbing.  Psychiatric: Normal mood and affect. speech and behavior is normal.    Labs on Admission:  Basic Metabolic Panel:  Recent Labs Lab 07/02/12 1235  NA 135  K 3.9  CL 101  CO2 24  GLUCOSE 129*  BUN 14  CREATININE 1.12  CALCIUM 8.8   Liver Function Tests: No results found for this basename: AST, ALT, ALKPHOS, BILITOT, PROT, ALBUMIN,  in the last 168 hours No results found for this basename: LIPASE, AMYLASE,  in the last 168 hours No results found for this basename: AMMONIA,  in the last 168 hours CBC:  Recent  Labs Lab 07/02/12 1235  WBC 12.5*  NEUTROABS 10.0*  HGB 15.2  HCT 44.7  MCV 93.5  PLT 221   Cardiac Enzymes: No results found for this basename: CKTOTAL, CKMB, CKMBINDEX, TROPONINI,  in the last 168 hours BNP: No components found with this basename: POCBNP,  CBG: No results found for this basename: GLUCAP,  in the last 168 hours  Radiological Exams on Admission: Dg Chest 2 View  07/02/2012  *RADIOLOGY REPORT*  Clinical Data: Cough.  Wheezing.  Shortness of breath.  CHEST - 2 VIEW  Comparison: Earlier in the day from [facility] and 04/21/2012  Findings: Probable VP shunt catheter tubing again projecting over the right neck and upper chest. Midline trachea.  Normal heart size and mediastinal contours. No pleural effusion or pneumothorax. Mild to moderate interstitial prominence is diffuse. No lobar consolidation.  IMPRESSION: Mild to moderate interstitial thickening.  Although this could represent pulmonary edema/congestive heart failure, absence of pleural fluid is somewhat atypical.  Alternate explanations include infection, including viral or mycoplasma pneumonia.   Original Report Authenticated By: Jeronimo Greaves, M.D.       Assessment/Plan Principal Problem:   Community acquired pneumonia Admit to medical floor Symptoms unimproved for past one week. Has mild leukocytosis and fever with cough , runny nose and chest congestion.  Will start IV Levaquin 750 mg daily. Blood cultures, sputum cultures, urine strep antigen antigen ordered. -He does have associated flulike symptoms and we'll check for flu PCR. Start Tamiflu -Patient having active wheezing and desating on room air. Continue O2 via nasal cannula, Order for IV Solumedrol  and scheduled nebs   Active Problems:   GERD (gastroesophageal reflux disease) 1 omeprazole home. Switch to Protonix    Down syndrome On Aricept at home which will continue    Schizoaffective disorder Continue trazodone and  invega. Follows with Dr. Lucianne Muss   DVT prophylaxis: Subcutaneous Lovenox Diet: Regular  Code Status: Full code Family Communication: Father at bedside Disposition Plan: Home once stable  Eddie North [facility] Pager [phone]  If 7PM-7AM, please contact night-coverage [email or web address] Password [facility] 07/02/2012, 5:02 PM    Total time spent on admission: 70 minutes

## 2012-07-02 NOTE — ED Provider Notes (Signed)
History     CSN: [id number]  Arrival date & time 07/02/12  1216   First MD Initiated Contact with Patient 07/02/12 1351      Chief Complaint  Patient presents with  . Shortness of Breath  . Wheezing    (Consider location/radiation/quality/duration/timing/severity/associated sxs/prior treatment) HPI Patient presents emergency department with cough and wheezing for the last week.  The father states that last night his condition seemed to worsen.  He has seen his primary care doctor 1 week ago and given Zithromax.  Mother states that the child has not had nausea, vomiting, diarrhea, abdominal pain, headache, or weakness.  Patient was given other medications prior to arrival.  The child does have Down's syndrome. Past Medical History  Diagnosis Date  . Down's syndrome   . GERD (gastroesophageal reflux disease)   . Hydrocephalus   . Schizoaffective disorder     Past Surgical History  Procedure Laterality Date  . Csf shunt    . Brain surgery      Family History  Problem Relation Age of Onset  . Hypertension Mother   . Renal Disease Father     History  Substance Use Topics  . Smoking status: Never Smoker   . Smokeless tobacco: Never Used  . Alcohol Use: No      Review of Systems Level V caveat applies due to mental retardation. Allergies  Ceclor and Penicillins  Home Medications  No current outpatient prescriptions on file.  BP 120/75  Pulse 103  Temp(Src) 101.6 F (38.7 C) (Axillary)  Resp 32  SpO2 95%  Physical Exam  Nursing note and vitals reviewed. Constitutional: He appears well-developed and well-nourished. No distress.  HENT:  Head: Normocephalic and atraumatic.  Mouth/Throat: Oropharynx is clear and moist.  Eyes: Pupils are equal, round, and reactive to light.  Neck: Normal range of motion. Neck supple.  Cardiovascular: Normal rate, regular rhythm and normal heart sounds.  Exam reveals no gallop and no friction rub.   No murmur  heard. Pulmonary/Chest: Tachypnea noted. He has wheezes in the right upper field, the right middle field, the right lower field, the left upper field, the left middle field and the left lower field. He has no rales.  Abdominal: Soft. Bowel sounds are normal. He exhibits no distension. There is no tenderness.  Neurological: He is alert.  Skin: Skin is warm and dry. No rash noted. No erythema.    ED Course  Procedures (including critical care time)  Labs Reviewed  URINALYSIS, ROUTINE W REFLEX MICROSCOPIC - Abnormal; Notable for the following:    Specific Gravity, Urine 1.031 (*)    All other components within normal limits  CBC WITH DIFFERENTIAL - Abnormal; Notable for the following:    WBC 12.5 (*)    Neutrophils Relative 80 (*)    Neutro Abs 10.0 (*)    All other components within normal limits  BASIC METABOLIC PANEL - Abnormal; Notable for the following:    Glucose, Bld 129 (*)    GFR calc non Af Amer 89 (*)    All other components within normal limits  CULTURE, BLOOD (ROUTINE X 2)  CULTURE, BLOOD (ROUTINE X 2)  CULTURE, EXPECTORATED SPUTUM-ASSESSMENT  GRAM STAIN  HIV ANTIBODY (ROUTINE TESTING)  LEGIONELLA ANTIGEN, URINE  STREP PNEUMONIAE URINARY ANTIGEN  INFLUENZA PANEL BY PCR  CBC  CREATININE, SERUM  BASIC METABOLIC PANEL  CBC   Dg Chest 2 View  07/02/2012  *RADIOLOGY REPORT*  Clinical Data: Cough.  Wheezing.  Shortness of breath.  CHEST - 2 VIEW  Comparison: Earlier in the day from [facility] and 04/21/2012  Findings: Probable VP shunt catheter tubing again projecting over the right neck and upper chest. Midline trachea.  Normal heart size and mediastinal contours. No pleural effusion or pneumothorax. Mild to moderate interstitial prominence is diffuse. No lobar consolidation.  IMPRESSION: Mild to moderate interstitial thickening.  Although this could represent pulmonary edema/congestive heart failure, absence of pleural fluid is somewhat atypical.   Alternate explanations include infection, including viral or mycoplasma pneumonia.   Original Report Authenticated By: Jeronimo Greaves, M.D.      1. Shortness of breath   2. Wheezing   3. Community acquired pneumonia     I spoke with the, [facility], who will come see the patient for admission.  IV Levaquin was given after discussion with the hospitalist.  Patient been stable during his entire emergency room visit.  He did not improve following nebulized treatment  MDM  MDM Reviewed: vitals and nursing note Interpretation: labs and x-ray Consults: admitting MD            Carlyle Dolly, PA-C 07/02/12 1932

## 2012-07-02 NOTE — Progress Notes (Signed)
ED CM noted Cm consult for -Pt currently has home health. Please follow for possible needs upon discharge.  CM spoke with pt and male at bedside to assess agency home health services provided through Confirmed that pt receives CAPS/PCS from an agency called [facility] [employer]. [address] [place], [place] [address] [phone] fax. Male at bedside is pt's personal care provider.  Pending evaluation for possible need for home health, further CM may or may not be needed

## 2012-07-02 NOTE — ED Notes (Signed)
RT called and aware pt needs continuous neb

## 2012-07-02 NOTE — ED Notes (Signed)
Bed:WA01<BR> Expected date:<BR> Expected time:<BR> Means of arrival:<BR> Comments:<BR>

## 2012-07-02 NOTE — ED Notes (Signed)
Patient's father reports that the patient has had a cough and wheezing 7 days ago. Patient was seen by his PCP and was prescribed z-pack and albuterol inhaler. Patient began having a fever last night and continues to wheeze.

## 2012-07-03 LAB — BASIC METABOLIC PANEL
BUN: 13 mg/dL (ref 6–23)
Calcium: 8.8 mg/dL (ref 8.4–10.5)
Creatinine, Ser: 1.01 mg/dL (ref 0.50–1.35)
GFR calc non Af Amer: >90 mL/min (ref >90)
Glucose, Bld: 180 mg/dL — ABNORMAL HIGH (ref 70–99)

## 2012-07-03 LAB — INFLUENZA PANEL BY PCR (TYPE A & B)
H1N1 flu by pcr: NOT DETECTED
Influenza A By PCR: NEGATIVE
Influenza B By PCR: NEGATIVE

## 2012-07-03 LAB — CBC
MCH: 31.4 pg (ref 26.0–34.0)
MCHC: 33.3 g/dL (ref 30.0–36.0)
Platelets: 211 10*3/uL (ref 150–400)

## 2012-07-03 NOTE — Clinical Documentation Improvement (Signed)
BMI DOCUMENTATION CLARIFICATION QUERY  THIS DOCUMENT IS NOT A PERMANENT PART OF THE MEDICAL RECORD  TO RESPOND TO THE THIS QUERY, FOLLOW THE INSTRUCTIONS BELOW:  1. If needed, update documentation for the patient's encounter via the notes activity.  2. Access this query again and click edit on the In [employer].  3. After updating, or not, click F2 to complete all highlighted (required) fields concerning your review. Select "additional documentation in the medical record" OR "no additional documentation provided".  4. Click Sign note button.  5. The deficiency will fall out of your In Basket *Please let us know if you are not able to complete this workflow by phone or e-mail (listed below).         07/03/12  Dear Dr. Gonzella Lex Marton Redwood  In an effort to better capture your patient's severity of illness, reflect appropriate length of stay and utilization of resources, a review of the patient medical record has revealed the following indicators.    Based on your clinical judgment, please clarify and document in a progress note and/or discharge summary the clinical condition associated with the following supporting information:  In responding to this query please exercise your independent judgment.  The fact that a query is asked, does not imply that any particular answer is desired or expected. Based on your clinical judgment, please document in the progress notes and discharge summary if condition below provides greater specificity regarding the patient's height and weight: - Morbid Obesity, BMI  Possible Clinical conditions  Morbid Obesity W/ BMI= 40.5   Other condition___________________  Cannot Clinically determine _____________  Risk Factors:Down's syndrome     .  GERD (gastroesophageal reflux disease)     .  Hydrocephalus     .  Schizoaffective disorder    Signs & Symptoms: Weight: 214 lbs   Height:  68ft. 1 in.   BMI = 40.5  -Medications:   Protonix    Reviewed:  no additional documentation provided  Thank You,  Andy Gauss RN, BSN  Clinical Documentation Specialist:  Pager # [phone]  Office [phone]  Health Information Management [facility]

## 2012-07-03 NOTE — Progress Notes (Signed)
[facility] PROGRESS NOTE  Sergio [name] WUJ:[id number] DOB: 1985/12/31 DOA: 07/02/2012 PCP: Rudi Heap, MD  Brief narrative 27 year old male with Down syndrome, schizoaffective disorder, cord presents with one week of URI symptoms with cough and chest congestion and fever since 1 day with symptoms suggestive of pneumonia.  Assessment/Plan: Community acquired pneumonia  -Symptomatically improving. Still has a fever. Continue IV Levaquin Blood cultures, sputum cultures, urine strep antigen antigen ordered.  -Tamiflu started for associated flulike symptoms. Flu PCR pending -The new IV Solu Medrol and scheduled and nebs for wheezing. Symptoms improving  Active Problems:  GERD (gastroesophageal reflux disease)  Daily Protonix  Down syndrome  Daily Aricept  Schizoaffective disorder  Continue trazodone and invega. Follows with Dr. Lucianne Muss as outpatient  DVT prophylaxis: Subcutaneous Lovenox  Diet: Regular      Code Status: Full code Family Communication: Mother at bedside  isposition Plan: Home if stable tomorrow.   Consultants:  None  Procedures:  None  Antibiotics:  The Levaquin (day 2)  HPI/Subjective: Patient seen and examined this morning. Had a MAXIMUM TEMPERATURE of 101.15F. Mother at bedside informs his cough to be much better. Still complains of some congestion in the chest. Breathing much better today.  Objective: Filed Vitals:   07/02/12 2200 07/03/12 0350 07/03/12 0600 07/03/12 0931  BP: 110/76  102/58   Pulse: 83  92   Temp: 98 F (36.7 C)  97.8 F (36.6 C)   TempSrc: Axillary  Axillary   Resp: 22  22   Height:      Weight:      SpO2: 93% 92% 90% 91%    Intake/Output Summary (Last 24 hours) at 07/03/12 1054 Last data filed at 07/02/12 2304  Gross per 24 hour  Intake      0 ml  Output    350 ml  Net   -350 ml   Filed Weights   07/02/12 2043  Weight: 97.07 kg (214 lb)    Exam:   General: Young male lying in bed in no acute  distress  HEENT: No pallor, moist oral mucosa  Cardiovascular: Normal S1 and S2, no murmurs rub or gallop  Respiratory: Bilateral wheezes, improved from yesterday, no crackles  Abdomen: , Nontender, nondistended, bowel sounds present  Extremities: Warm, no edema  CNS: AAO x3, has Down's facies , nonfocal  Data Reviewed: Basic Metabolic Panel:  Recent Labs Lab 07/02/12 1235 07/02/12 1923 07/03/12 0500  NA 135  --  136  K 3.9  --  4.6  CL 101  --  101  CO2 24  --  23  GLUCOSE 129*  --  180*  BUN 14  --  13  CREATININE 1.12 1.12 1.01  CALCIUM 8.8  --  8.8   Liver Function Tests: No results found for this basename: AST, ALT, ALKPHOS, BILITOT, PROT, ALBUMIN,  in the last 168 hours No results found for this basename: LIPASE, AMYLASE,  in the last 168 hours No results found for this basename: AMMONIA,  in the last 168 hours CBC:  Recent Labs Lab 07/02/12 1235 07/02/12 1923 07/03/12 0500  WBC 12.5* 10.1 8.8  NEUTROABS 10.0*  --   --   HGB 15.2 13.9 15.3  HCT 44.7 42.7 45.9  MCV 93.5 94.9 94.1  PLT 221 201 211   Cardiac Enzymes: No results found for this basename: CKTOTAL, CKMB, CKMBINDEX, TROPONINI,  in the last 168 hours BNP (last 3 results) No results found for this basename: PROBNP,  in the  last 8760 hours CBG: No results found for this basename: GLUCAP,  in the last 168 hours  Recent Results (from the past 240 hour(s))  CULTURE, BLOOD (ROUTINE X 2)     Status: None   Collection Time    07/02/12  7:07 PM      Result Value Range Status   Specimen Description BLOOD RIGHT HAND   Final   Special Requests BOTTLES DRAWN AEROBIC ONLY 2CC   Final   Culture  Setup Time 07/02/2012 22:56   Final   Culture     Final   Value:        BLOOD CULTURE RECEIVED NO GROWTH TO DATE CULTURE WILL BE HELD FOR 5 DAYS BEFORE ISSUING A FINAL NEGATIVE REPORT   Report Status PENDING   Incomplete  CULTURE, BLOOD (ROUTINE X 2)     Status: None   Collection Time    07/02/12  7:24 PM       Result Value Range Status   Specimen Description BLOOD RIGHT HAND   Final   Special Requests BOTTLES DRAWN AEROBIC ONLY 2CC   Final   Culture  Setup Time 07/02/2012 22:56   Final   Culture     Final   Value:        BLOOD CULTURE RECEIVED NO GROWTH TO DATE CULTURE WILL BE HELD FOR 5 DAYS BEFORE ISSUING A FINAL NEGATIVE REPORT   Report Status PENDING   Incomplete     Studies: Dg Chest 2 View  07/02/2012  *RADIOLOGY REPORT*  Clinical Data: Cough.  Wheezing.  Shortness of breath.  CHEST - 2 VIEW  Comparison: Earlier in the day from [facility] and 04/21/2012  Findings: Probable VP shunt catheter tubing again projecting over the right neck and upper chest. Midline trachea.  Normal heart size and mediastinal contours. No pleural effusion or pneumothorax. Mild to moderate interstitial prominence is diffuse. No lobar consolidation.  IMPRESSION: Mild to moderate interstitial thickening.  Although this could represent pulmonary edema/congestive heart failure, absence of pleural fluid is somewhat atypical.  Alternate explanations include infection, including viral or mycoplasma pneumonia.   Original Report Authenticated By: Jeronimo Greaves, M.D.     Scheduled Meds: . albuterol  2 puff Inhalation Q6H  . albuterol  2.5 mg Nebulization Q6H  . donepezil  5 mg Oral QHS  . enoxaparin (LOVENOX) injection  40 mg Subcutaneous Q24H  . levofloxacin  750 mg Oral Daily  . methylPREDNISolone (SOLU-MEDROL) injection  80 mg Intravenous Q6H  . oseltamivir  75 mg Oral BID  . paliperidone  3 mg Oral QHS  . pantoprazole  40 mg Oral Daily  . traZODone  300 mg Oral QHS   Continuous Infusions: . sodium chloride 75 mL/hr (07/02/12 1815)      Time spent: 25 minutes    Decorey Wahlert  [facility] Pager [phone] If 8PM-8AM, please contact night-coverage at [email or web address], password [facility] 07/03/2012, 10:54 AM  LOS: 1 day

## 2012-07-04 MED ORDER — MELATONIN 3 MG PO TABS: 3.0000 | ORAL_TABLET | Freq: Every evening | ORAL | Status: DC | PRN

## 2012-07-04 NOTE — Progress Notes (Signed)
PHARMACIST - PHYSICIAN ORDER COMMUNICATION  CONCERNING: P&T Medication Policy on Herbal Medications  DESCRIPTION:  This patient's order for:  melatonin  has been noted.  This product(s) is classified as an "herbal" or natural product. Due to a lack of definitive safety studies or FDA approval, nonstandard manufacturing practices, plus the potential risk of unknown drug-drug interactions while on inpatient medications, the Pharmacy and Therapeutics Committee does not permit the use of "herbal" or natural products of this type within [facility].   ACTION TAKEN: The pharmacy department is unable to verify this order at this time and your patient has been informed of this safety policy. Please reevaluate patient's clinical condition at discharge and address if the herbal or natural product(s) should be resumed at that time.  Darrol Angel, PharmD Pager: [phone] 07/04/2012 6:05 PM

## 2012-07-04 NOTE — Progress Notes (Signed)
[facility] PROGRESS NOTE  Sergio [name] AVW:[id number] DOB: 1985-08-30 DOA: 07/02/2012 PCP: Rudi Heap, MD  Brief narrative  27 year old male with Down syndrome, schizoaffective disorder, cord presents with one week of URI symptoms with cough and chest congestion and fever since 1 day with symptoms suggestive of pneumonia.   Assessment/Plan:  Community acquired pneumonia  -Symptomatically improving. Afebrile today still has coarse breath sounds and rhonchi. O2 sat 90 on room air.. -Continue Levaquin Blood cultures so far negative, urine and Legionella antigen pending -PCR negative. Tamiflu discontinued -Continue with IV Solu Medrol and scheduled nebs for today  Active Problems:  GERD (gastroesophageal reflux disease)  The new Protonix   Down syndrome  Continue Aricept   Schizoaffective disorder  Continue trazodone and invega. Follows with Dr. Lucianne Muss as outpatient   DVT prophylaxis: Subcutaneous Lovenox  Diet: Regular    Code Status: Full code Family Communication: Father at bedside Disposition Plan: Monitor for one more day if stable can be discharged in the morning   Consultants:  None  Procedures:  None  Antibiotics:  Levaquin day 3  HPI/Subjective: Feels better overall however still has some shortness of breath and rhonchi. Afebrile  Objective: Filed Vitals:   07/03/12 2142 07/04/12 0126 07/04/12 0649 07/04/12 0935  BP: 129/68  108/74   Pulse: 76  53   Temp: 99.2 F (37.3 C)  97.5 F (36.4 C)   TempSrc: Oral  Oral   Resp: 22  20   Height:      Weight:      SpO2: 90% 94% 92% 91%    Intake/Output Summary (Last 24 hours) at 07/04/12 1018 Last data filed at 07/04/12 0600  Gross per 24 hour  Intake   2675 ml  Output    400 ml  Net   2275 ml   Filed Weights   07/02/12 2043  Weight: 97.07 kg (214 lb)    Exam:  General: Young male lying in bed in no acute distress  HEENT: No pallor, moist oral mucosa  Cardiovascular: Normal S1 and  S2, no murmurs rub or gallop  Respiratory: Still has coarse breath sounds and bilateral rhonchi Abdomen: , Nontender, nondistended, bowel sounds present  Extremities: Warm, no edema  CNS: AAO x3, has Down's facies , nonfocal   Data Reviewed: Basic Metabolic Panel:  Recent Labs Lab 07/02/12 1235 07/02/12 1923 07/03/12 0500  NA 135  --  136  K 3.9  --  4.6  CL 101  --  101  CO2 24  --  23  GLUCOSE 129*  --  180*  BUN 14  --  13  CREATININE 1.12 1.12 1.01  CALCIUM 8.8  --  8.8   Liver Function Tests: No results found for this basename: AST, ALT, ALKPHOS, BILITOT, PROT, ALBUMIN,  in the last 168 hours No results found for this basename: LIPASE, AMYLASE,  in the last 168 hours No results found for this basename: AMMONIA,  in the last 168 hours CBC:  Recent Labs Lab 07/02/12 1235 07/02/12 1923 07/03/12 0500  WBC 12.5* 10.1 8.8  NEUTROABS 10.0*  --   --   HGB 15.2 13.9 15.3  HCT 44.7 42.7 45.9  MCV 93.5 94.9 94.1  PLT 221 201 211   Cardiac Enzymes: No results found for this basename: CKTOTAL, CKMB, CKMBINDEX, TROPONINI,  in the last 168 hours BNP (last 3 results) No results found for this basename: PROBNP,  in the last 8760 hours CBG: No results found for this basename:  GLUCAP,  in the last 168 hours  Recent Results (from the past 240 hour(s))  CULTURE, BLOOD (ROUTINE X 2)     Status: None   Collection Time    07/02/12  7:07 PM      Result Value Range Status   Specimen Description BLOOD RIGHT HAND   Final   Special Requests BOTTLES DRAWN AEROBIC ONLY 2CC   Final   Culture  Setup Time 07/02/2012 22:56   Final   Culture     Final   Value:        BLOOD CULTURE RECEIVED NO GROWTH TO DATE CULTURE WILL BE HELD FOR 5 DAYS BEFORE ISSUING A FINAL NEGATIVE REPORT   Report Status PENDING   Incomplete  CULTURE, BLOOD (ROUTINE X 2)     Status: None   Collection Time    07/02/12  7:24 PM      Result Value Range Status   Specimen Description BLOOD RIGHT HAND   Final    Special Requests BOTTLES DRAWN AEROBIC ONLY 2CC   Final   Culture  Setup Time 07/02/2012 22:56   Final   Culture     Final   Value:        BLOOD CULTURE RECEIVED NO GROWTH TO DATE CULTURE WILL BE HELD FOR 5 DAYS BEFORE ISSUING A FINAL NEGATIVE REPORT   Report Status PENDING   Incomplete     Studies: Dg Chest 2 View  07/02/2012  *RADIOLOGY REPORT*  Clinical Data: Cough.  Wheezing.  Shortness of breath.  CHEST - 2 VIEW  Comparison: Earlier in the day from [facility] and 04/21/2012  Findings: Probable VP shunt catheter tubing again projecting over the right neck and upper chest. Midline trachea.  Normal heart size and mediastinal contours. No pleural effusion or pneumothorax. Mild to moderate interstitial prominence is diffuse. No lobar consolidation.  IMPRESSION: Mild to moderate interstitial thickening.  Although this could represent pulmonary edema/congestive heart failure, absence of pleural fluid is somewhat atypical.  Alternate explanations include infection, including viral or mycoplasma pneumonia.   Original Report Authenticated By: Jeronimo Greaves, M.D.     Scheduled Meds: . albuterol  2.5 mg Nebulization Q6H  . donepezil  5 mg Oral QHS  . enoxaparin (LOVENOX) injection  40 mg Subcutaneous Q24H  . levofloxacin  750 mg Oral Daily  . methylPREDNISolone (SOLU-MEDROL) injection  80 mg Intravenous Q6H  . paliperidone  3 mg Oral QHS  . pantoprazole  40 mg Oral Daily  . traZODone  300 mg Oral QHS   Continuous Infusions: . sodium chloride 1,000 mL (07/04/12 0502)      Time spent: 25 minutes    Ajeet Casasola  [facility] Pager [phone] If 8PM-8AM, please contact night-coverage at [email or web address], password [facility] 07/04/2012, 10:18 AM  LOS: 2 days

## 2012-07-04 NOTE — ED Provider Notes (Signed)
Medical screening examination/treatment/procedure(s) were conducted as a shared visit with non-physician practitioner(s) and myself.  I personally evaluated the patient during the encounter.  25yM with hx of down's syndrome with sob and wheezing. CXR w/ interstitial thickening. Question viral or mycoplasma. Pt has already been on azithromycin which should cover for atypicals. Multiple nebs in ED and still with a lot of wheezing and mild tachypnea on my exam. O2 sats remain around 90% when put back to RA. Will admit.   Raeford Razor, MD 07/04/12 [phone]

## 2012-07-05 DIAGNOSIS — R0602 Shortness of breath: Secondary | ICD-10-CM | POA: Diagnosis present

## 2012-07-05 MED ORDER — LEVOFLOXACIN 750 MG PO TABS: 750.0000 mg | ORAL_TABLET | Freq: Every day | ORAL | Status: DC

## 2012-07-05 MED ORDER — PREDNISONE 20 MG PO TABS: ORAL_TABLET | ORAL | Status: DC

## 2012-07-05 MED ORDER — GUAIFENESIN-DM 100-10 MG/5ML PO SYRP: 5.0000 mL | ORAL_SOLUTION | ORAL | Status: DC | PRN

## 2012-07-05 MED ORDER — ALBUTEROL SULFATE (5 MG/ML) 0.5% IN NEBU: 2.5000 mg | INHALATION_SOLUTION | Freq: Four times a day (QID) | RESPIRATORY_TRACT | Status: DC

## 2012-07-05 NOTE — Care Management Note (Signed)
    Page 1 of 1   07/05/2012     2:15:46 PM   CARE MANAGEMENT NOTE 07/05/2012  Patient:  Sergio [name], Sergio [name]   Account Number:  [id number]  Date Initiated:  07/03/2012  Documentation initiated by:  [facility]  Subjective/Objective Assessment:   27 year old male with hx Down Syndrome admitted with PNA. Lives at home and has hh aide services.     Action/Plan:   Will return home at d/c.   Anticipated DC Date:  07/06/2012   Anticipated DC Plan:  HOME W HOME HEALTH SERVICES      DC Planning Services  CM consult      Choice offered to / List presented to:             Status of service:  Completed, signed off Medicare Important Message given?  NA - LOS <3 / Initial given by admissions (If response is "NO", the following Medicare IM given date fields will be blank) Date Medicare IM given:   Date Additional Medicare IM given:    Discharge Disposition:  HOME W HOME HEALTH SERVICES  Per UR Regulation:  Reviewed for med. necessity/level of care/duration of stay  If discussed at Long Length of Stay Meetings, dates discussed:    Comments:  07/05/12 KATHY MAHABIR RN,BSN NCM [phone] D/C HOME W/PREVIOUS HOME HEALTH AIDE SERVICES.NO ORDERS @ D/C.

## 2012-07-05 NOTE — Discharge Summary (Signed)
Physician Discharge Summary  Sergio [name] ZOX:[id number] DOB: 09-29-1985 DOA: 07/02/2012  PCP: Rudi Heap, MD  Admit date: 07/02/2012 Discharge date: 07/05/2012  Time spent: 40  minutes  Recommendations for Outpatient Follow-up:  1. Discharge Home  With outpt PCP f/up on 1 week  Discharge Diagnoses:  Principal Problem:   Community acquired pneumonia  Active Problems:   GERD (gastroesophageal reflux disease)   Down's syndrome   Schizoaffective disorder   Shortness of breath   Discharge Condition: fair  Diet recommendation: regular  Filed Weights   07/02/12 2043  Weight: 97.07 kg (214 lb)    History of present illness:  Please refer to admission H&P for details but in brief, 27 year old male with Down syndrome, schizoaffective disorder, cord presents with one week of URI symptoms with cough and chest congestion and fever since 1 day with symptoms suggestive of pneumonia.      Hospital Course:  Community acquired pneumonia  -Symptomatically improving. Afebrile for past  48 hrs and sats stable on RA although has some wheezing still.   -patient started on levaquin Levaquin ( day 3) Blood cultures so far negative, urine and Legionella antigen pending  -PCR negative. Tamiflu discontinued  improving on IV solumedrol and nebs. Patient stable . Will discharge on po prednisone , albuterol nebs , antitussives and 4 more days of po levaquin.   Active Problems:  GERD (gastroesophageal reflux disease)  Continue PPI  Down syndrome  Continue Aricept   Schizoaffective disorder  Continue trazodone and invega. Follows with Dr. Lucianne Muss as outpatient    Code Status: Full code  Procedures:  none  Consultations:  none  Discharge Exam: Filed Vitals:   07/04/12 1947 07/04/12 2158 07/05/12 0607 07/05/12 0900  BP:  113/60 110/71   Pulse:  77 71   Temp:  98.1 F (36.7 C) 97.5 F (36.4 C)   TempSrc:  Axillary Axillary   Resp:  20 18   Height:      Weight:      SpO2:  92% 95% 90% 94%   General: Young male lying in bed in no acute distress  HEENT: No pallor, moist oral mucosa  Cardiovascular: Normal S1 and S2, no murmurs rub or gallop  Respiratory: bilateral wheezing, improving Abdomen: , Nontender, nondistended, bowel sounds present  Extremities: Warm, no edema  CNS: AAO x3, has Down's facies , nonfocal   Discharge Instructions     Medication List    TAKE these medications       albuterol (5 MG/ML) 0.5% nebulizer solution  Commonly known as:  PROVENTIL  Take 0.5 mLs (2.5 mg total) by nebulization every 6 (six) hours.     benzonatate 200 MG capsule  Commonly known as:  TESSALON  Take 200 mg by mouth 3 (three) times daily as needed for cough.     donepezil 5 MG tablet  Commonly known as:  ARICEPT  Take 5 mg by mouth at bedtime.     guaiFENesin-dextromethorphan 100-10 MG/5ML syrup  Commonly known as:  ROBITUSSIN DM  Take 5 mLs by mouth every 4 (four) hours as needed for cough.     levofloxacin 750 MG tablet  Commonly known as:  LEVAQUIN  Take 1 tablet (750 mg total) by mouth daily.for 4 days     Melatonin 3 MG Tabs  Take 1 tablet by mouth at bedtime as needed (sleep).     omeprazole 20 MG capsule  Commonly known as:  PRILOSEC  Take 20 mg by mouth daily.  paliperidone 3 MG 24 hr tablet  Commonly known as:  INVEGA  Take 3 mg by mouth at bedtime.     predniSONE 20 MG tablet  Commonly known as:  DELTASONE  Take 2 tablets daily for next 5 days     traZODone 100 MG tablet  Commonly known as:  DESYREL  Take 300 mg by mouth at bedtime.           Follow-up Information   Follow up with Rudi Heap, MD In 1 week.   Contact information:   [address] [place] [address] [phone]        The results of significant diagnostics from this hospitalization (including imaging, microbiology, ancillary and laboratory) are listed below for reference.    Significant Diagnostic Studies: Dg Chest 2 View  07/02/2012   *RADIOLOGY REPORT*  Clinical Data: Cough.  Wheezing.  Shortness of breath.  CHEST - 2 VIEW  Comparison: Earlier in the day from [facility] and 04/21/2012  Findings: Probable VP shunt catheter tubing again projecting over the right neck and upper chest. Midline trachea.  Normal heart size and mediastinal contours. No pleural effusion or pneumothorax. Mild to moderate interstitial prominence is diffuse. No lobar consolidation.  IMPRESSION: Mild to moderate interstitial thickening.  Although this could represent pulmonary edema/congestive heart failure, absence of pleural fluid is somewhat atypical.  Alternate explanations include infection, including viral or mycoplasma pneumonia.   Original Report Authenticated By: Jeronimo Greaves, M.D.     Microbiology: Recent Results (from the past 240 hour(s))  CULTURE, BLOOD (ROUTINE X 2)     Status: None   Collection Time    07/02/12  7:07 PM      Result Value Range Status   Specimen Description BLOOD RIGHT HAND   Final   Special Requests BOTTLES DRAWN AEROBIC ONLY 2CC   Final   Culture  Setup Time 07/02/2012 22:56   Final   Culture     Final   Value:        BLOOD CULTURE RECEIVED NO GROWTH TO DATE CULTURE WILL BE HELD FOR 5 DAYS BEFORE ISSUING A FINAL NEGATIVE REPORT   Report Status PENDING   Incomplete  CULTURE, BLOOD (ROUTINE X 2)     Status: None   Collection Time    07/02/12  7:24 PM      Result Value Range Status   Specimen Description BLOOD RIGHT HAND   Final   Special Requests BOTTLES DRAWN AEROBIC ONLY 2CC   Final   Culture  Setup Time 07/02/2012 22:56   Final   Culture     Final   Value:        BLOOD CULTURE RECEIVED NO GROWTH TO DATE CULTURE WILL BE HELD FOR 5 DAYS BEFORE ISSUING A FINAL NEGATIVE REPORT   Report Status PENDING   Incomplete     Labs: Basic Metabolic Panel:  Recent Labs Lab 07/02/12 1235 07/02/12 1923 07/03/12 0500  NA 135  --  136  K 3.9  --  4.6  CL 101  --  101  CO2 24  --  23  GLUCOSE 129*   --  180*  BUN 14  --  13  CREATININE 1.12 1.12 1.01  CALCIUM 8.8  --  8.8   Liver Function Tests: No results found for this basename: AST, ALT, ALKPHOS, BILITOT, PROT, ALBUMIN,  in the last 168 hours No results found for this basename: LIPASE, AMYLASE,  in the last 168 hours No results  found for this basename: AMMONIA,  in the last 168 hours CBC:  Recent Labs Lab 07/02/12 1235 07/02/12 1923 07/03/12 0500  WBC 12.5* 10.1 8.8  NEUTROABS 10.0*  --   --   HGB 15.2 13.9 15.3  HCT 44.7 42.7 45.9  MCV 93.5 94.9 94.1  PLT 221 201 211   Cardiac Enzymes: No results found for this basename: CKTOTAL, CKMB, CKMBINDEX, TROPONINI,  in the last 168 hours BNP: BNP (last 3 results) No results found for this basename: PROBNP,  in the last 8760 hours CBG: No results found for this basename: GLUCAP,  in the last 168 hours     Signed:  DHUNGEL, NISHANT  [facility] 07/05/2012, 10:19 AM

## 2012-07-07 MED FILL — Albuterol Sulfate Soln Nebu 0.5% (5 MG/ML): RESPIRATORY_TRACT | Qty: 20 | Status: AC

## 2012-07-08 LAB — CULTURE, BLOOD (ROUTINE X 2): Culture: NO GROWTH

## 2012-07-14 DIAGNOSIS — E785 Hyperlipidemia, unspecified: Secondary | ICD-10-CM | POA: Diagnosis not present

## 2012-07-14 DIAGNOSIS — Z0000 Encounter for general adult medical examination without abnormal findings: Secondary | ICD-10-CM | POA: Diagnosis not present

## 2012-08-10 ENCOUNTER — Other Ambulatory Visit: Payer: Self-pay | Admitting: Family

## 2012-08-10 DIAGNOSIS — F0281 Dementia in other diseases classified elsewhere with behavioral disturbance: Secondary | ICD-10-CM

## 2012-08-10 MED ORDER — DONEPEZIL HCL 5 MG PO TABS: 5.0000 mg | ORAL_TABLET | Freq: Every day | ORAL | Status: DC

## 2012-08-13 ENCOUNTER — Other Ambulatory Visit: Payer: Self-pay

## 2012-08-13 MED ORDER — BENZONATATE 200 MG PO CAPS: 200.0000 mg | ORAL_CAPSULE | Freq: Three times a day (TID) | ORAL | Status: DC | PRN

## 2012-08-20 ENCOUNTER — Other Ambulatory Visit: Payer: Self-pay | Admitting: Family

## 2012-08-20 DIAGNOSIS — F0281 Dementia in other diseases classified elsewhere with behavioral disturbance: Secondary | ICD-10-CM

## 2012-08-20 MED ORDER — DONEPEZIL HCL 10 MG PO TABS: 10.0000 mg | ORAL_TABLET | Freq: Every day | ORAL | Status: DC

## 2012-08-20 NOTE — Telephone Encounter (Signed)
Previous Rx of Aricept 5mg  was entered in error. Patient is taking 10mg  daily.

## 2012-08-27 ENCOUNTER — Ambulatory Visit (HOSPITAL_COMMUNITY): Payer: Self-pay | Admitting: Psychiatry

## 2012-09-02 DIAGNOSIS — F060 Psychotic disorder with hallucinations due to known physiological condition: Secondary | ICD-10-CM

## 2012-09-02 DIAGNOSIS — T38815A Adverse effect of anterior pituitary [adenohypophyseal] hormones, initial encounter: Secondary | ICD-10-CM | POA: Insufficient documentation

## 2012-09-02 DIAGNOSIS — F209 Schizophrenia, unspecified: Secondary | ICD-10-CM | POA: Insufficient documentation

## 2012-09-02 DIAGNOSIS — G911 Obstructive hydrocephalus: Secondary | ICD-10-CM

## 2012-09-02 DIAGNOSIS — R51 Headache: Secondary | ICD-10-CM | POA: Insufficient documentation

## 2012-09-02 DIAGNOSIS — R519 Headache, unspecified: Secondary | ICD-10-CM | POA: Insufficient documentation

## 2012-09-02 DIAGNOSIS — F0281 Dementia in other diseases classified elsewhere with behavioral disturbance: Secondary | ICD-10-CM

## 2012-09-08 ENCOUNTER — Ambulatory Visit (INDEPENDENT_AMBULATORY_CARE_PROVIDER_SITE_OTHER): Payer: Medicare Other

## 2012-09-08 ENCOUNTER — Telehealth: Payer: Self-pay | Admitting: Physician Assistant

## 2012-09-08 ENCOUNTER — Encounter: Payer: Self-pay | Admitting: Family Medicine

## 2012-09-08 ENCOUNTER — Other Ambulatory Visit: Payer: Self-pay | Admitting: Physician Assistant

## 2012-09-08 ENCOUNTER — Other Ambulatory Visit: Payer: Self-pay | Admitting: Family

## 2012-09-08 ENCOUNTER — Ambulatory Visit (INDEPENDENT_AMBULATORY_CARE_PROVIDER_SITE_OTHER): Payer: Medicare Other | Admitting: Family Medicine

## 2012-09-08 VITALS — BP 96/61 | HR 98 | Temp 98.0°F | Wt 213.0 lb

## 2012-09-08 DIAGNOSIS — R05 Cough: Secondary | ICD-10-CM

## 2012-09-08 DIAGNOSIS — R0989 Other specified symptoms and signs involving the circulatory and respiratory systems: Secondary | ICD-10-CM

## 2012-09-08 DIAGNOSIS — R0602 Shortness of breath: Secondary | ICD-10-CM | POA: Diagnosis not present

## 2012-09-08 DIAGNOSIS — R059 Cough, unspecified: Secondary | ICD-10-CM | POA: Diagnosis not present

## 2012-09-08 DIAGNOSIS — J45901 Unspecified asthma with (acute) exacerbation: Secondary | ICD-10-CM | POA: Diagnosis not present

## 2012-09-08 DIAGNOSIS — J189 Pneumonia, unspecified organism: Secondary | ICD-10-CM

## 2012-09-08 MED ORDER — BENZONATATE 200 MG PO CAPS: 200.0000 mg | ORAL_CAPSULE | Freq: Three times a day (TID) | ORAL | Status: DC | PRN

## 2012-09-08 MED ORDER — IPRATROPIUM-ALBUTEROL 0.5-2.5 (3) MG/3ML IN SOLN: 3.0000 mL | Freq: Four times a day (QID) | RESPIRATORY_TRACT | Status: DC | PRN

## 2012-09-08 MED ORDER — LEVOFLOXACIN 750 MG PO TABS: 750.0000 mg | ORAL_TABLET | Freq: Every day | ORAL | Status: DC

## 2012-09-08 NOTE — Telephone Encounter (Signed)
APPT MADE

## 2012-09-08 NOTE — Progress Notes (Signed)
Patient ID: Sergio [name], male   DOB: [birth year], 27 y.o.   MRN: [id number] SUBJECTIVE: HPI: Cough Patient complains of chills, nonproductive cough and wheezing. Symptoms began 2 days ago. Symptoms have been gradually worsening since that time.The cough is harsh and raspy and is aggravated by nothing. Associated symptoms include: chills and fever. Patient does not have new pets. Patient does have a history of asthma. Patient does not have a history of environmental allergens. Patient has not traveled recently. Patient does not have a history of smoking. Patient has had a previous chest x-ray. Patient has not had a PPD done. Patient with Down's  Syndrome, hydrocephalus,schizoaffective disorder and a recent admission at [facility] for pneumonia. Mom has had to give him neb treatments with albuterol with some  Response.   PMH/PSH: reviewed/updated in Epic  SH/FH: reviewed/updated in Epic  Allergies: reviewed/updated in Epic  Medications: reviewed/updated in Epic  Immunizations: reviewed/updated in Epic  ROS: As above in the HPI. All other systems are stable or negative.  OBJECTIVE: APPEARANCE: obvious facial fearures of Down's  Syndrome. Shunt in the right side of the head  BP 96/61  Pulse 98  Temp(Src) 98 F (36.7 C) (Oral)  Wt 213 lb (96.616 kg)  BMI 40.9 kg/m2  SpO2 92%  SKIN: warm and  Dry without overt rashes, tattoos and scars  HEAD and Neck: without JVD, Head and scalp:abnormal with shunt in right scalp. Eyes:No scleral icterus. Fundi normal, eyemongoloid  Features. Ears: Auricle normal, canal normal, Tympanic membranes normal, insufflation normal. Nose:rhinorrhea Throat: red Neck & thyroid: normal  CHEST & LUNGS: Chest wall: normal Lungs:coarse BS. Rare wheeze. Excellent air  Entry.  CVS: Reveals the PMI to be normally located. Regular rhythm, First and Second Heart sounds are normal,  absence of murmurs, rubs or gallops.  ABDOMEN:  Benign,, no organomegaly, no  masses, no Abdominal Aortic enlargement. No Guarding , no rebound. No Bruits. Bowel sounds: normal  EXTREMETIES: nonedematous. Both Femoral and Pedal pulses are normal.  NEUROLOGIC: oriented to time,place and person; Strength is normal  ASSESSMENT: Cough - Plan: DG Chest 2 View, BASIC METABOLIC PANEL WITH GFR, Brain natriuretic peptide, CBC with Differential  Chest congestion - Plan: BASIC METABOLIC PANEL WITH GFR, Brain natriuretic peptide, benzonatate (TESSALON) 200 MG capsule, CBC with Differential  CAP (community acquired pneumonia) - Plan: levofloxacin (LEVAQUIN) 750 MG tablet, CANCELED: POCT CBC  Asthma with acute exacerbation   PLAN: Reviewed records. Orders Placed This Encounter  Procedures  . DG Chest 2 View    Standing Status: Future     Number of Occurrences: 1     Standing Expiration Date: 11/08/2013    Order Specific Question:  Reason for Exam (SYMPTOM  OR DIAGNOSIS REQUIRED)    Answer:  cough    Order Specific Question:  Preferred imaging location?    Answer:  Internal  . BASIC METABOLIC PANEL WITH GFR  . Brain natriuretic peptide  . CBC with Differential   [facility] reading (PRIMARY) by  Dr.Vishnu Moeller. Increased markings bilaterally and marked increase in markings vs infiltrate LLL.       Meds ordered this encounter  Medications  . esomeprazole (NEXIUM) 40 MG capsule    Sig: Take 40 mg by mouth daily before breakfast.  . levofloxacin (LEVAQUIN) 750 MG tablet    Sig: Take 1 tablet (750 mg total) by mouth daily.    Dispense:  7 tablet    Refill:  0  . ipratropium-albuterol (DUONEB) 0.5-2.5 (3) MG/3ML SOLN  Sig: Take 3 mLs by nebulization every 6 (six) hours as needed.    Dispense:  360 mL    Refill:  2  . benzonatate (TESSALON) 200 MG capsule    Sig: Take 1 capsule (200 mg total) by mouth 3 (three) times daily as needed for cough.    Dispense:  40 capsule    Refill:  1   Rx written with DX for the duoneb for Pharmacist.  Fluids, if any acute worsening, mom to  take him to ED.  Otherwise RTC in 2 days for  Recheck. Await labs.  Sukari Grist P. [place] Charon, M.D.

## 2012-09-09 ENCOUNTER — Ambulatory Visit: Payer: Self-pay | Admitting: Pediatrics

## 2012-09-09 LAB — CBC WITH DIFFERENTIAL/PLATELET
Basophils Absolute: 0.1 10*3/uL (ref 0.0–0.1)
Basophils Relative: 0 % (ref 0–1)
Eosinophils Absolute: 0.1 10*3/uL (ref 0.0–0.7)
Eosinophils Relative: 1 % (ref 0–5)
HCT: 42.8 % (ref 39.0–52.0)
Hemoglobin: 14.4 g/dL (ref 13.0–17.0)
Lymphocytes Relative: 17 % (ref 12–46)
Lymphs Abs: 2.1 10*3/uL (ref 0.7–4.0)
MCH: 31.6 pg (ref 26.0–34.0)
MCHC: 33.6 g/dL (ref 30.0–36.0)
MCV: 94.1 fL (ref 78.0–100.0)
Monocytes Absolute: 0.3 10*3/uL (ref 0.1–1.0)
Monocytes Relative: 3 % (ref 3–12)
Neutro Abs: 9.8 10*3/uL — ABNORMAL HIGH (ref 1.7–7.7)
Neutrophils Relative %: 79 % — ABNORMAL HIGH (ref 43–77)
Platelets: 230 10*3/uL (ref 150–400)
RBC: 4.55 MIL/uL (ref 4.22–5.81)
RDW: 15.6 % — ABNORMAL HIGH (ref 11.5–15.5)
WBC: 12.3 10*3/uL — ABNORMAL HIGH (ref 4.0–10.5)

## 2012-09-09 LAB — BASIC METABOLIC PANEL WITH GFR
BUN: 17 mg/dL (ref 6–23)
CO2: 26 mEq/L (ref 19–32)
Calcium: 8.8 mg/dL (ref 8.4–10.5)
Chloride: 105 mEq/L (ref 96–112)
Creat: 1.14 mg/dL (ref 0.50–1.35)
GFR, Est African American: >89 mL/min
GFR, Est Non African American: 88 mL/min
Glucose, Bld: 110 mg/dL — ABNORMAL HIGH (ref 70–99)
Potassium: 4.5 mEq/L (ref 3.5–5.3)
Sodium: 139 mEq/L (ref 135–145)

## 2012-09-09 NOTE — Progress Notes (Signed)
Quick Note:  Call patient's mom, the CXray read by the radiologist: did not show any new problems No change in plan anyway.He has follow up tomorrow. ______

## 2012-09-10 ENCOUNTER — Ambulatory Visit (INDEPENDENT_AMBULATORY_CARE_PROVIDER_SITE_OTHER): Payer: Medicare Other | Admitting: Family Medicine

## 2012-09-10 ENCOUNTER — Encounter: Payer: Self-pay | Admitting: Family Medicine

## 2012-09-10 VITALS — BP 109/67 | HR 63 | Temp 98.5°F | Wt 213.6 lb

## 2012-09-10 DIAGNOSIS — R509 Fever, unspecified: Secondary | ICD-10-CM

## 2012-09-10 DIAGNOSIS — G911 Obstructive hydrocephalus: Secondary | ICD-10-CM | POA: Diagnosis not present

## 2012-09-10 DIAGNOSIS — Q909 Down syndrome, unspecified: Secondary | ICD-10-CM

## 2012-09-10 DIAGNOSIS — J189 Pneumonia, unspecified organism: Secondary | ICD-10-CM | POA: Diagnosis not present

## 2012-09-10 DIAGNOSIS — G919 Hydrocephalus, unspecified: Secondary | ICD-10-CM

## 2012-09-10 DIAGNOSIS — J45901 Unspecified asthma with (acute) exacerbation: Secondary | ICD-10-CM

## 2012-09-10 DIAGNOSIS — J441 Chronic obstructive pulmonary disease with (acute) exacerbation: Secondary | ICD-10-CM

## 2012-09-10 LAB — BRAIN NATRIURETIC PEPTIDE: Brain Natriuretic Peptide: 15.1 pg/mL (ref 0.0–100.0)

## 2012-09-10 NOTE — Progress Notes (Signed)
Subjective:     Patient ID: Sergio [name], male   DOB: [birth year], 27 y.o.   MRN: [id number]  HPI Here for follow up. Doing great. The fever has resolved. Breathing is good. 2 days of levaquin. Having neb treatments. Doing great.  Past Medical History  Diagnosis Date  . Down's syndrome   . GERD (gastroesophageal reflux disease)   . Hydrocephalus   . Schizoaffective disorder   . Dementia   . Memory loss   . Headache   . Difficulty swallowing   . Weakness   . Insomnia    Past Surgical History  Procedure Laterality Date  . Csf shunt    . Brain surgery    . Ventriculoperitoneal shunt     Current Outpatient Prescriptions on File Prior to Visit  Medication Sig Dispense Refill  . albuterol (PROVENTIL) (5 MG/ML) 0.5% nebulizer solution Take 0.5 mLs (2.5 mg total) by nebulization every 6 (six) hours.  20 mL  0  . benzonatate (TESSALON) 200 MG capsule Take 1 capsule (200 mg total) by mouth 3 (three) times daily as needed for cough.  40 capsule  1  . donepezil (ARICEPT) 10 MG tablet TAKE ONE TABLET AT BEDTIME  30 tablet  0  . esomeprazole (NEXIUM) 40 MG capsule Take 40 mg by mouth daily before breakfast.      . guaiFENesin-dextromethorphan (ROBITUSSIN DM) 100-10 MG/5ML syrup Take 5 mLs by mouth every 4 (four) hours as needed for cough.  118 mL  0  . ipratropium-albuterol (DUONEB) 0.5-2.5 (3) MG/3ML SOLN Take 3 mLs by nebulization every 6 (six) hours as needed.  360 mL  2  . levofloxacin (LEVAQUIN) 750 MG tablet Take 1 tablet (750 mg total) by mouth daily.  4 tablet  0  . levofloxacin (LEVAQUIN) 750 MG tablet Take 1 tablet (750 mg total) by mouth daily.  7 tablet  0  . Melatonin 3 MG TABS Take 4 tablets by mouth at bedtime as needed (sleep). Takes 12 mg      . omeprazole (PRILOSEC) 20 MG capsule Take 20 mg by mouth daily.      . paliperidone (INVEGA) 3 MG 24 hr tablet Take 3 mg by mouth at bedtime.      Marland Kitchen PROAIR HFA 108 (90 BASE) MCG/ACT inhaler 2 PUFFS 4 TIMES A DAY AS NEEDED  8.5 g  2  .  traZODone (DESYREL) 100 MG tablet Take 300 mg by mouth at bedtime.       No current facility-administered medications on file prior to visit.   Allergies  Allergen Reactions  . Ceclor (Cefaclor) Anaphylaxis  . Penicillins Anaphylaxis    There is no immunization history on file for this patient. History   Social History  . Marital Status: Single    Spouse Name: N/A    Number of Children: N/A  . Years of Education: N/A   Occupational History  . Not on file.   Social History Main Topics  . Smoking status: Never Smoker   . Smokeless tobacco: Never Used  . Alcohol Use: No  . Drug Use: No  . Sexually Active: Not on file   Other Topics Concern  . Not on file   Social History Narrative  . No narrative on file       Review of Systems No  Other complaints.    Objective:   Physical Exam APPEARANCE:  Patient in no acute distress.The patient appeared well nourished and normally developed. Acyanotic. Waist: VITAL SIGNS:BP 109/67  Pulse  63  Temp(Src) 98.5 F (36.9 C) (Oral)  Wt 213 lb 9.6 oz (96.888 kg)  BMI 41.01 kg/m2  SpO2 93% Male with short stature and features of Down's  Syndrome. Looks so good today  SKIN: warm and  Dry without overt rashes, tattoos and scars  HEAD and Neck: without JVD,has the shunt in place for hydrocephalus Head and scalp: abnormal, no change Eyes:No scleral icterus. Fundi normal, eye movements normal. Ears: Auricle normal, canal normal, Tympanic membranes normal, insufflation normal. Nose: normal Throat: normal Neck & thyroid: normal  CHEST & LUNGS: Chest wall: normal Lungs: Clear today  CVS: Reveals the PMI to be normally located. Regular rhythm, First and Second Heart sounds are normal,  absence of murmurs, rubs or gallops.  ABDOMEN:  Appearance: obese Benign,, no organomegaly, no masses, no Abdominal Aortic enlargement. No Guarding , no rebound. No Bruits. Bowel sounds: normal   EXTREMETIES: nonedematous.       Assessment:     Pneumonia  Obstructive hydrocephalus  Hydrocephalus  Fever  Down's syndrome  Asthma, chronic obstructive, with acute exacerbation  Much better , resolving     Plan:     Continue regimen. RTc in 2 weeks for  Recheck before reducing neb treatments. Mom was pleased with his outcome. Reviewed the Xray report.  Hillari Zumwalt P. [place] Charon, M.D.

## 2012-09-15 ENCOUNTER — Telehealth: Payer: Self-pay | Admitting: Family Medicine

## 2012-09-15 NOTE — Telephone Encounter (Signed)
Mom aware rx called to [facility] rx.

## 2012-09-15 NOTE — Telephone Encounter (Signed)
Refill the levaquin 750 mg daily for 5 more days. Please call in. Thanks.

## 2012-09-17 ENCOUNTER — Other Ambulatory Visit: Payer: Self-pay | Admitting: Nurse Practitioner

## 2012-09-17 MED ORDER — LEVOFLOXACIN 750 MG PO TABS: 750.0000 mg | ORAL_TABLET | Freq: Every day | ORAL | Status: DC

## 2012-09-22 ENCOUNTER — Encounter: Payer: Self-pay | Admitting: Pediatrics

## 2012-09-22 ENCOUNTER — Ambulatory Visit (INDEPENDENT_AMBULATORY_CARE_PROVIDER_SITE_OTHER): Payer: Medicare Other | Admitting: Pediatrics

## 2012-09-22 VITALS — BP 100/60 | HR 78 | Ht 60.5 in | Wt 214.2 lb

## 2012-09-22 DIAGNOSIS — D352 Benign neoplasm of pituitary gland: Secondary | ICD-10-CM

## 2012-09-22 DIAGNOSIS — G911 Obstructive hydrocephalus: Secondary | ICD-10-CM | POA: Diagnosis not present

## 2012-09-22 DIAGNOSIS — R0989 Other specified symptoms and signs involving the circulatory and respiratory systems: Secondary | ICD-10-CM

## 2012-09-22 DIAGNOSIS — F0281 Dementia in other diseases classified elsewhere with behavioral disturbance: Secondary | ICD-10-CM | POA: Diagnosis not present

## 2012-09-22 DIAGNOSIS — R0683 Snoring: Secondary | ICD-10-CM

## 2012-09-22 DIAGNOSIS — F02818 Dementia in other diseases classified elsewhere, unspecified severity, with other behavioral disturbance: Secondary | ICD-10-CM | POA: Diagnosis not present

## 2012-09-22 DIAGNOSIS — R0609 Other forms of dyspnea: Secondary | ICD-10-CM

## 2012-09-22 DIAGNOSIS — Q909 Down syndrome, unspecified: Secondary | ICD-10-CM | POA: Diagnosis not present

## 2012-09-22 DIAGNOSIS — D353 Benign neoplasm of craniopharyngeal duct: Secondary | ICD-10-CM

## 2012-09-22 MED ORDER — DONEPEZIL HCL 10 MG PO TABS: 10.0000 mg | ORAL_TABLET | Freq: Every day | ORAL | Status: DC

## 2012-09-22 NOTE — Patient Instructions (Signed)
I think that Sergio [name] needs to have a modified barium swallow with speech therapy in attendance to evaluate his aspiration. I think that he needs a polysomnogram to evaluate for sleep apnea.  One of you will need to be in attendance but not able to sleep in the room with him. I think that he also needs a evaluation by a podiatrist to make certain that his toenails are properly clipped and healthy. Overall, I think he's doing well but I'm concerned about the frequent episodes of pneumonia.

## 2012-09-22 NOTE — Progress Notes (Signed)
Patient: Sergio [name] MRN: [id number] Sex: male DOB: [birth year]  Provider: Deetta Perla, MD Location of Care:  [facility]  Note type: Routine return visit  History of Present Illness: Referral Source: Dr. Nelly Rout History from: both parents, patient and [facility] chart Chief Complaint: Trisomy 21  JAMEAL RAZZANO is a 27 y.o. male who returns for evaluation and management of Trisomy 21, congenital hydrocephalus, brain malformation, pituitary microadenoma, and recurrent pneumonia.  "[name]" returns today for the first time since August 26, 2011.  He is a 27 year old man with Trisomy 21, congenital hydrocephalus, partial agenesis of the corpus callosum, history of schizophrenia with hallucinations, and a 3 x 5 mm pituitary microadenoma.  He had an elevated prolactin level of 54.1, which led to the MRI scan.  I noticed that there was some crowding of his brainstem by the shape of his skull and his cerebellum.  Technically, he did not have a Chiari malformation.  He has experienced a three-month history of recurrent pneumonias and was hospitalized for five days at [facility].  Two weeks later, his symptoms recurred.  His mother stated that he had aspiration pneumonia, but the discharge diagnosis was a community-acquired pneumonia.  The patient was recently seen by his primary physician Dr. Modesto Charon.  He presented with nonproductive cough and wheezing.  Again a diagnosis of community-acquired pneumonia and asthma with acute exacerbation was made.  I am not certain from where mother got the idea that he was having aspiration.  At the time, we discussed this, if seen reasonable the chart does not support that diagnosis, however.  The patient also has episodes of insomnia and snoring.  The patient's father has sleep apnea.  Mother believes that her son also has sleep apnea.  As best I can tell, except his pulmonary issues there has not been the deterioration in this young man in the  years since I saw him.  He does seem to be more fatigued.  There is some trouble swallowing.  There are times that he has difficulty falling asleep and that he has arousals from sleep.  His speech is slurred.  He has significant problems with anxiety, disinterest in activities and occasional hallucinations, although they are not prominent nor are they worsening.  Review of Systems: 12 system review was remarkable for fatigue, trouble swallowing, short of breath,cough, wheezing, snoring, easy bruising, increased thirst, runny nose, skin sensitivity, frequent infections, memory loss, confusion, slurred speech, difficulty swallowing, anxiety, not enough sleep, decreased energy, disinterest in activities, hallucinations,insomnia,sleepiness, snoring and restless legs.  Past Medical History  Diagnosis Date  . Down's syndrome   . GERD (gastroesophageal reflux disease)   . Hydrocephalus   . Schizoaffective disorder   . Dementia   . Memory loss   . Headache   . Difficulty swallowing   . Weakness   . Insomnia    Hospitalizations: yes, Head Injury: no, Nervous System Infections: no, Immunizations up to date: yes Past Medical History Comments: Patient has been hospitalized several times due to surgeries and pneumonia, recent 5 day hospital stay in April 2014 due to pneumonia.  He had congenital hydrocephalus.  He had a shunt revision in July 16,1992 because of a proximal disconnection of the shunt and distal tubing.He had another revision Sep 16, 2001 because of very break in the shunt tube distal to the reservoir.  He had another shunt revision of the ventricular catheter Oct 03, 2004.  Your lumbar peritoneal shunt December 25, 2004.  MRI scan of  the brain shows a large anterior temporal horns in the vicinity of hippocampal formation, agenesis of the splenium of the corpus callosum and otherwise all compressed ventricles.  In late December 2006 he had hallucinations where he would see cartoon characters were  threatening to him and family members.  He had command auditory hallucinations that upset him.  He beat his head, ran from one place to another, and placed a sack over his head. He was treated with escalating doses of Seroquel which was then switched to Geodon.  He received Benadryl for sleep at the time of evaluation he had problems with active auditory visual and tactile hallucinations.  This does not seem to be a problem at this time.  He had hospitalizations  For pneumonia 5 and 27 years of age.  EEG November 19, 2005 was normal awake and drowsy.  Birth History 4 lbs. 10 oz. Infant born at [redacted] weeks gestational age  Behavior History none  Surgical History Past Surgical History  Procedure Laterality Date  . Csf shunt    . Brain surgery    . Ventriculoperitoneal shunt     Surgeries: yes Surgical History Comments: Shunt replacement in lower back.  Family History family history includes Emphysema in his maternal grandfather; Hypertension in his mother; and Renal Disease in his father. Hypercholesterolemia and diabetes in his father, thyroid problems in his mother. Family History is negative migraines, seizures, cognitive impairment, blindness, deafness, birth defects, chromosomal disorder, autism.  Social History History   Social History  . Marital Status: Single    Spouse Name: N/A    Number of Children: N/A  . Years of Education: N/A   Social History Main Topics  . Smoking status: Never Smoker   . Smokeless tobacco: Never Used  . Alcohol Use: No  . Drug Use: No  . Sexually Active: None   Other Topics Concern  . None   Social History Narrative  . None   Educational level N/A School Attending: N/A   Occupation: N/A Living with both parents  Hobbies/Interest: none School comments N/A  Current Outpatient Prescriptions on File Prior to Visit  Medication Sig Dispense Refill  . benzonatate (TESSALON) 200 MG capsule Take 1 capsule (200 mg total) by mouth 3 (three) times daily  as needed for cough.  40 capsule  1  . donepezil (ARICEPT) 10 MG tablet TAKE ONE TABLET AT BEDTIME  30 tablet  0  . esomeprazole (NEXIUM) 40 MG capsule Take 40 mg by mouth daily before breakfast.      . ipratropium-albuterol (DUONEB) 0.5-2.5 (3) MG/3ML SOLN Take 3 mLs by nebulization every 6 (six) hours as needed.  360 mL  2  . levofloxacin (LEVAQUIN) 750 MG tablet Take 1 tablet (750 mg total) by mouth daily.  4 tablet  0  . paliperidone (INVEGA) 3 MG 24 hr tablet Take 3 mg by mouth at bedtime.      . traZODone (DESYREL) 100 MG tablet Take 300 mg by mouth at bedtime.       No current facility-administered medications on file prior to visit.   The medication list was reviewed and reconciled. All changes or newly prescribed medications were explained.  A complete medication list was provided to the patient/caregiver.  Allergies  Allergen Reactions  . Ceclor (Cefaclor) Anaphylaxis  . Penicillins Anaphylaxis    Physical Exam BP 100/60  Pulse 78  Ht 5' 0.5" (1.537 m)  Wt 214 lb 3.2 oz (97.16 kg)  BMI 41.13 kg/m2  General: alert,  well developed, well nourished, obese young man in no acute distress , right-handed, brown hair, blue eyes Head:  microcephalic,  small ears, epicanthal folds,  small digits with bilateral clinodactyly, Brushfield spots Ears, Nose and Throat:  Pharynx: oropharynx is pink without exudates or tonsillar hypertrophy, furrowed tongue. Neck: supple, full range of motion, no cranial or cervical bruits Respiratory: auscultation clear Cardiovascular: no murmurs, pulses are normal Musculoskeletal: no skeletal deformities or apparent scoliosis Skin: no rashes or neurocutaneous lesions, four finger  palmar creases bilaterally  Neurologic Exam  Mental Status: alert; oriented to person, place; knowledge is  below normal for age; language is normal speech is intelligible Cranial Nerves: visual fields are full to double simultaneous stimuli; extraocular movements are full and  conjugate; the right pupil is round, the left is  irregular, both are eccentric, reactive to light; funduscopic examination shows sharp disc margins with normal vessels; symmetric facial strength; midline tongue and uvula; air conduction is greater than bone conduction bilaterally. Motor: Normal strength, diminished tone, and mass; good fine motor movements; no pronator drift. Sensory: intact responses to cold, vibration, proprioception and stereognosis  Coordination: good finger-to-nose, rapid repetitive alternating movements and finger apposition   Gait and Station: broad-based waddling gait and station; patient has difficulty walking on his heels, toes and tandem with difficulty; balance is poor. Reflexes: symmetric and diminished bilaterally; no clonus; bilateral flexor plantar responses.  Assessment 1. Trisomy 21 758.0. 2. Dementia with behavioral disturbances 294.11. 3. Pituitary microadenoma 227.3. 4. Obstructive hydrocephalus 331.4 with functioning shunt. 5. History of snoring 786.09.  Discussion His pituitary microadenoma is stable.  I do not know the last time that he had a prolactin level.  He is not showing signs of compression of his posterior fossa in terms of unsteady gait, changes in his eye movements, or greater slurring a speech.  I can't tell if the patient is truly having significant dysphagia or not.  Plan Because of issues with swallowing, I think that he should have a modified barium swallow with a speech therapist in attendance.  I also would recommend a polysomnogram to study his possible sleep apnea.  The patient is scheduled to be seen by Dr. Modesto Charon tomorrow.  I asked the family to have him call me, so that we could discuss the situation.  Without a history of aspiration pneumonia, there seems to be a bit less with imperative for a barium swallow, although I still think that it would be valuable.  CPAP machines are changing, and it may be that he could be fit that one  that will work well.  This may significantly improve the quality of his sleep.  I spent 30 minutes of face-to-face time with the patient and his parents, more than half of it in consultation.     Medication List       These changes are accurate as of: 09/22/2012 11:59 PM. If you have any questions, ask your nurse or doctor.          STOP taking these medications       albuterol (5 MG/ML) 0.5% nebulizer solution  Commonly known as:  PROVENTIL  Stopped by:  Deetta Perla, MD     guaiFENesin-dextromethorphan 100-10 MG/5ML syrup  Commonly known as:  ROBITUSSIN DM  Stopped by:  Deetta Perla, MD     omeprazole 20 MG capsule  Commonly known as:  PRILOSEC  Stopped by:  Deetta Perla, MD     PROAIR HFA 108 [phone] BASE) MCG/ACT  inhaler  Generic drug:  albuterol  Stopped by:  Deetta Perla, MD      TAKE these medications       benzonatate 200 MG capsule  Commonly known as:  TESSALON  Take 1 capsule (200 mg total) by mouth 3 (three) times daily as needed for cough.     donepezil 10 MG tablet  Commonly known as:  ARICEPT  Take 1 tablet (10 mg total) by mouth at bedtime.  Changed by:  Deetta Perla, MD     esomeprazole 40 MG capsule  Commonly known as:  NEXIUM  Take 40 mg by mouth daily before breakfast.     ipratropium-albuterol 0.5-2.5 (3) MG/3ML Soln  Commonly known as:  DUONEB  Take 3 mLs by nebulization every 6 (six) hours as needed.     levofloxacin 750 MG tablet  Commonly known as:  LEVAQUIN  Take 1 tablet (750 mg total) by mouth daily.     Melatonin 10 MG Tabs  Take 1 tablet by mouth at bedtime.     paliperidone 3 MG 24 hr tablet  Commonly known as:  INVEGA  Take 3 mg by mouth at bedtime.     traZODone 100 MG tablet  Commonly known as:  DESYREL  Take 300 mg by mouth at bedtime.       Deetta Perla MD

## 2012-09-23 ENCOUNTER — Ambulatory Visit (INDEPENDENT_AMBULATORY_CARE_PROVIDER_SITE_OTHER): Payer: Medicare Other | Admitting: Family Medicine

## 2012-09-23 ENCOUNTER — Encounter: Payer: Self-pay | Admitting: Family Medicine

## 2012-09-23 VITALS — BP 106/66 | HR 56 | Temp 96.9°F | Ht 60.0 in | Wt 214.0 lb

## 2012-09-23 DIAGNOSIS — J45909 Unspecified asthma, uncomplicated: Secondary | ICD-10-CM | POA: Diagnosis not present

## 2012-09-23 DIAGNOSIS — G4730 Sleep apnea, unspecified: Secondary | ICD-10-CM

## 2012-09-23 DIAGNOSIS — K219 Gastro-esophageal reflux disease without esophagitis: Secondary | ICD-10-CM

## 2012-09-23 DIAGNOSIS — J690 Pneumonitis due to inhalation of food and vomit: Secondary | ICD-10-CM | POA: Diagnosis not present

## 2012-09-23 DIAGNOSIS — G4710 Hypersomnia, unspecified: Secondary | ICD-10-CM | POA: Diagnosis not present

## 2012-09-23 DIAGNOSIS — L600 Ingrowing nail: Secondary | ICD-10-CM | POA: Diagnosis not present

## 2012-09-23 NOTE — Progress Notes (Signed)
Patient ID: Sergio [name], male   DOB: 10-Mar-1986, 27 y.o.   MRN: [id number] SUBJECTIVE: HPI: Came for follow up of PNeumonia  And SAR and GERD and wheezing. He has not fully recovered. He tends to have evening congestion, cough and wheezing and Reflux type  symptoms  Especially when he lays down. He has Down's  Syndrome and Obstructive hydrocephalus with a  Shunt present. He saw the neurologist Dr Sharene Skeans and he had agreed about concerns about aspiratin and made some recommendations for Dr Modesto Charon.   PMH/PSH: reviewed/updated in Epic  SH/FH: reviewed/updated in Epic  Allergies: reviewed/updated in Epic  Medications: reviewed/updated in Epic  Immunizations: reviewed/updated in Epic  ROS: As above in the HPI. All other systems are stable or negative.  OBJECTIVE: APPEARANCE:  Patient in no acute distress.The patient appeared well nourished and normally developed. Acyanotic. Waist: VITAL SIGNS:BP 106/66  Pulse 56  Temp(Src) 96.9 F (36.1 C) (Oral)  Ht 5' (1.524 m)  Wt 214 lb (97.07 kg)  BMI 41.79 kg/m2 Obvious features of Down's syndrome+facies. He is obese. Has a shunt present for his hydrocephalus.  SKIN: warm and  Dry without overt rashes, tattoos and scars  HEAD and Neck: without JVD, Head and scalp: normal Eyes:No scleral icterus. Fundi normal, eye movements normal. Ears: Auricle normal, canal normal, Tympanic membranes normal, insufflation normal. Nose: normal Throat: normal Neck & thyroid: normal  CHEST & LUNGS: Chest wall: normal Lungs: Clear in apices. RLL has a few crackles heard, bibasilar wheezes. scatterred Rhonchi.  CVS: Reveals the PMI to be normally located. Regular rhythm, First and Second Heart sounds are normal,  absence of murmurs, rubs or gallops. Peripheral vasculature: Radial pulses: normal Dorsal pedis pulses: normal Posterior pulses: normal  ABDOMEN:  Appearance:obese Benign,, no organomegaly, no masses, no Abdominal Aortic  enlargement. No Guarding , no rebound. No Bruits. Bowel sounds: normal  RECTAL: N/A GU: N/A  EXTREMETIES: nonedematous. Both Femoral and Pedal pulses are normal. Nails are short and thick with a tendency to be ingrown.  MUSCULOSKELETAL:  Spine: normal Joints: intact  NEUROLOGIC: oriented to ,place and person;  ASSESSMENT: Recurrent aspiration pneumonia - Plan: SLP modified barium swallow  Hypersomnia with sleep apnea, unspecified - Plan: Nocturnal polysomnography (NPSG)  Ingrown nail - Plan: Ambulatory referral to Podiatry  Unspecified asthma  GERD (gastroesophageal reflux disease)   Reviewed Dr Darl Householder notes in EPIC and agree with the work up.  PLAN:  Continue treatment with the asthma and the GERD.  Orders Placed This Encounter  Procedures  . Ambulatory referral to Podiatry    Referral Priority:  Routine    Referral Type:  Consultation    Referral Reason:  Specialty Services Required    Requested Specialty:  Podiatry    Number of Visits Requested:  1  . SLP modified barium swallow    Standing Status: Future     Number of Occurrences:      Standing Expiration Date: 09/23/2013    Order Specific Question:  Where should this test be performed:    Answer:  [facility]    Order Specific Question:  Please indicate reason for Referral:    Answer:  Concerned about Dysphagia/Aspiration    Order Specific Question:  Patients current diet consistency:    Answer:  Regular    Order Specific Question:  Existing signs/symptoms of possible Aspiration/Dysphagia:    Answer:  Coughing up mucus/phlegm    Order Specific Question:  Existing signs/symptoms of possible Aspiration/Dysphagia:    Answer:  Increased  secretions/chest congestion    Order Specific Question:  Other risk factors for Dysphagia:    Answer:  Recurrent history of Pneumonia    Order Specific Question:  Other risk factors for Dysphagia:    Answer:  Recurrent history of Pneumonia  . Nocturnal polysomnography  (NPSG)    Standing Status: Future     Number of Occurrences:      Standing Expiration Date: 09/23/2013    Scheduling Instructions:     Down's syndrome, recurrent pneumonia and GERD suspect aspiration. Also suspect sleep apnea.    Order Specific Question:  Where should this test be performed:    Answer:  [facility]   Results for orders placed in visit on 09/08/12  BASIC METABOLIC PANEL WITH GFR      Result Value Range   Sodium 139  135 - 145 mEq/L   Potassium 4.5  3.5 - 5.3 mEq/L   Chloride 105  96 - 112 mEq/L   CO2 26  19 - 32 mEq/L   Glucose, Bld 110 (*) 70 - 99 mg/dL   BUN 17  6 - 23 mg/dL   Creat 4.54  0.98 - 1.19 mg/dL   Calcium 8.8  8.4 - 14.7 mg/dL   GFR, Est African American >89     GFR, Est Non African American 88    BRAIN NATRIURETIC PEPTIDE      Result Value Range   Brain Natriuretic Peptide 15.1  0.0 - 100.0 pg/mL  CBC WITH DIFFERENTIAL      Result Value Range   WBC 12.3 (*) 4.0 - 10.5 K/uL   RBC 4.55  4.22 - 5.81 MIL/uL   Hemoglobin 14.4  13.0 - 17.0 g/dL   HCT 82.9  56.2 - 13.0 %   MCV 94.1  78.0 - 100.0 fL   MCH 31.6  26.0 - 34.0 pg   MCHC 33.6  30.0 - 36.0 g/dL   RDW 86.5 (*) 78.4 - 69.6 %   Platelets 230  150 - 400 K/uL   Neutrophils Relative % 79 (*) 43 - 77 %   Neutro Abs 9.8 (*) 1.7 - 7.7 K/uL   Lymphocytes Relative 17  12 - 46 %   Lymphs Abs 2.1  0.7 - 4.0 K/uL   Monocytes Relative 3  3 - 12 %   Monocytes Absolute 0.3  0.1 - 1.0 K/uL   Eosinophils Relative 1  0 - 5 %   Eosinophils Absolute 0.1  0.0 - 0.7 K/uL   Basophils Relative 0  0 - 1 %   Basophils Absolute 0.1  0.0 - 0.1 K/uL   Smear Review Criteria for review not met     No orders of the defined types were placed in this encounter.   RTc 2 months( after all the work up)  [employer]. Modesto Charon, M.D.

## 2012-09-24 ENCOUNTER — Other Ambulatory Visit: Payer: Self-pay | Admitting: Family Medicine

## 2012-09-24 DIAGNOSIS — G4730 Sleep apnea, unspecified: Secondary | ICD-10-CM

## 2012-09-24 DIAGNOSIS — R0683 Snoring: Secondary | ICD-10-CM

## 2012-09-25 ENCOUNTER — Other Ambulatory Visit (HOSPITAL_COMMUNITY): Payer: Self-pay | Admitting: Family Medicine

## 2012-09-25 DIAGNOSIS — R1310 Dysphagia, unspecified: Secondary | ICD-10-CM

## 2012-10-02 ENCOUNTER — Ambulatory Visit (HOSPITAL_COMMUNITY)
Admission: RE | Admit: 2012-10-02 | Discharge: 2012-10-02 | Disposition: A | Discharge status: Home / Self Care | Payer: Medicare Other | Source: Ambulatory Visit | Attending: Family Medicine | Admitting: Family Medicine

## 2012-10-02 DIAGNOSIS — R1311 Dysphagia, oral phase: Secondary | ICD-10-CM | POA: Diagnosis not present

## 2012-10-02 DIAGNOSIS — R1313 Dysphagia, pharyngeal phase: Secondary | ICD-10-CM | POA: Insufficient documentation

## 2012-10-02 DIAGNOSIS — R1310 Dysphagia, unspecified: Secondary | ICD-10-CM

## 2012-10-02 DIAGNOSIS — Z8701 Personal history of pneumonia (recurrent): Secondary | ICD-10-CM | POA: Insufficient documentation

## 2012-10-02 DIAGNOSIS — J690 Pneumonitis due to inhalation of food and vomit: Secondary | ICD-10-CM

## 2012-10-02 DIAGNOSIS — R1312 Dysphagia, oropharyngeal phase: Secondary | ICD-10-CM | POA: Insufficient documentation

## 2012-10-02 DIAGNOSIS — K219 Gastro-esophageal reflux disease without esophagitis: Secondary | ICD-10-CM | POA: Insufficient documentation

## 2012-10-02 DIAGNOSIS — G911 Obstructive hydrocephalus: Secondary | ICD-10-CM | POA: Insufficient documentation

## 2012-10-02 NOTE — Procedures (Signed)
Objective Swallowing Evaluation: Modified Barium Swallowing Study  Patient Details  Name: Sergio [name] MRN: [id number] Date of Birth: 1985-11-16  Today's Date: 10/02/2012 Time: 1125-1210 SLP Time Calculation (min): 45 min  Past Medical History:  Past Medical History  Diagnosis Date  . Down's syndrome   . GERD (gastroesophageal reflux disease)   . Hydrocephalus   . Schizoaffective disorder   . Dementia   . Memory loss   . Headache(784.0)   . Difficulty swallowing   . Weakness   . Insomnia    Past Surgical History:  Past Surgical History  Procedure Laterality Date  . Csf shunt    . Brain surgery    . Ventriculoperitoneal shunt     HPI:  Pt. seen for outpatient MBS accompanied by his mother and father.  PMH includes:  Trisomy 24, GERD (gastroesophageal reflux disease), Hydrocephalus, schizoaffective disorder, Dementia, Memory loss, Headache, Difficulty swallowing, weakness, Insomnia.  Family report some coughing during meal but father states primarily after.  He has had recurrent pna and concern is possible oropharyngeal dysphagia with aspiration.        Assessment / Plan / Recommendation Clinical Impression  Dysphagia Diagnosis: Mild oral phase dysphagia;Mild pharyngeal phase dysphagia;Moderate pharyngeal phase dysphagia Clinical impression: Pt. exhibted mild oral dysphagia as evidenced by intermittent labial residue and mild right buccal cavity pocketing.  Mild-moderate pharyngeal phase dysphagia is primarily motor based (minimal sensory component) with laryngeal penetration of thin barium via cup which majority of episodes spontaneously exited vestiuble during swallow (i.e. flash penetration). Trial of barium pill consumed with thin barium resulted in decreased oral control and cohesion, attributing to premature spill of barium before the swallow into laryngeal vestiuble followed by silent aspiration (of barium).  Pharyngeal residue was trace-min with thin consistency and posed  no significant risk.  Brief esophageal scan revealed what appeared to be slow transit through esophagus with stasis mid esophagus (no radiologist present to confirm).  It appears that pt.'s primary aspiration risk includes pill consumption with liquids, drinking via straw and aspiration risk from esophageal origin.  SLP recommends pt. continue a regular texture diet (using caution with dry, crumbly foods) and thin liquids.  It is recommended he consume pills whole in applesauce, pudding or yogurt, avoid straws.  SLP educated parents on reflux precautions.         Treatment Recommendation  No treatment recommended at this time    Diet Recommendation Regular;Thin liquid   Liquid Administration via: Cup;No straw Medication Administration: Whole meds with puree Supervision: Patient able to self feed;Full supervision/cueing for compensatory strategies Compensations: Slow rate;Small sips/bites Postural Changes and/or Swallow Maneuvers: Seated upright 90 degrees;Upright 30-60 min after meal    Other  Recommendations Oral Care Recommendations: Oral care BID   Follow Up Recommendations  None    Frequency and Duration        Pertinent Vitals/Pain none           Reason for Referral Objectively evaluate swallowing function   Oral Phase Oral Preparation/Oral Phase Oral Phase: Impaired Oral - Thin Oral - Thin Cup: Right pocketing in lateral sulci Oral - Thin Straw: Right pocketing in lateral sulci Oral - Solids Oral - Puree:  (labial residue)   Pharyngeal Phase Pharyngeal Phase Pharyngeal Phase: Impaired Pharyngeal - Thin Pharyngeal - Thin Teaspoon: Within functional limits Pharyngeal - Thin Cup: Penetration/Aspiration during swallow;Pharyngeal residue - valleculae;Pharyngeal residue - pyriform sinuses (trace-min) Penetration/Aspiration details (thin cup): Material enters airway, remains ABOVE vocal cords then ejected out Pharyngeal - Thin  Straw: Penetration/Aspiration during  swallow Penetration/Aspiration details (thin straw): Material enters airway, remains ABOVE vocal cords then ejected out Pharyngeal - Solids Pharyngeal - Puree: Delayed swallow initiation;Premature spillage to valleculae Pharyngeal - Pill: Penetration/Aspiration during swallow Penetration/Aspiration details (pill): Material enters airway, passes BELOW cords without attempt by patient to eject out (silent aspiration) (aspiration of thin)  Cervical Esophageal Phase        Cervical Esophageal Phase Cervical Esophageal Phase: [facility]    Functional Assessment Tool Used: clinical judgement Functional Limitations: Swallowing Swallow Current Status ([id number]): At least 20 percent but less than 40 percent impaired, limited or restricted Swallow Goal Status [phone]): At least 20 percent but less than 40 percent impaired, limited or restricted Swallow Discharge Status [phone]): At least 20 percent but less than 40 percent impaired, limited or restricted    Royce Macadamia M.Ed [employer] [phone]  10/02/2012

## 2012-10-05 ENCOUNTER — Encounter: Payer: Self-pay | Admitting: Family Medicine

## 2012-10-05 ENCOUNTER — Other Ambulatory Visit: Payer: Self-pay | Admitting: Family

## 2012-10-05 ENCOUNTER — Ambulatory Visit (INDEPENDENT_AMBULATORY_CARE_PROVIDER_SITE_OTHER): Payer: Medicare Other | Admitting: Psychiatry

## 2012-10-05 VITALS — BP 120/62 | Ht 60.0 in | Wt 216.2 lb

## 2012-10-05 DIAGNOSIS — F200 Paranoid schizophrenia: Secondary | ICD-10-CM

## 2012-10-05 DIAGNOSIS — F22 Delusional disorders: Secondary | ICD-10-CM

## 2012-10-05 DIAGNOSIS — F0390 Unspecified dementia without behavioral disturbance: Secondary | ICD-10-CM | POA: Diagnosis not present

## 2012-10-05 DIAGNOSIS — F259 Schizoaffective disorder, unspecified: Secondary | ICD-10-CM

## 2012-10-05 DIAGNOSIS — Z136 Encounter for screening for cardiovascular disorders: Secondary | ICD-10-CM

## 2012-10-05 DIAGNOSIS — E162 Hypoglycemia, unspecified: Secondary | ICD-10-CM

## 2012-10-05 MED ORDER — PALIPERIDONE ER 3 MG PO TB24: 3.0000 mg | ORAL_TABLET | Freq: Every day | ORAL | Status: DC

## 2012-10-05 MED ORDER — TRAZODONE HCL 100 MG PO TABS: 300.0000 mg | ORAL_TABLET | Freq: Every day | ORAL | Status: DC

## 2012-10-05 NOTE — Progress Notes (Signed)
Quick Note:  Labs abnormal. Just to contact mother of Sergio [name] to ensure that the speech pathologist reviewed the swallowing study result. Aspiration risk highest swallowing pills. Should swallow with applesauce etc. Thanks ______

## 2012-10-05 NOTE — Progress Notes (Signed)
Patient ID: Sergio [name], male   DOB: August 29, 1985, 27 y.o.   MRN: [id number]  [facility] [id number] Progress Note  Sergio [name] [id number] 27 y.o.  10/05/2012 3:37 PM  Chief Complaint: I am doing OK.  History of Present Illness: Patient is a 27 year old diagnosed with schizoaffective disorder, Down syndrome, GERD and hydrocephalus who presents today for a followup visit.  Mom reports that the patient recently had a swallowing study done which showed that he tends to aspirate his medications. She adds that he's now taking his medications with yogurt to prevent him from aspiration. She states that he's okay with drinking liquids.  Mom reports that patient still struggles with sleep and is to have a sleep study done in the next few weeks to see if he has sleep apnea. She adds other than that he seems to be doing fairly well. She also states that he attends the adult center 3-4 times a week and continues to have [employer].   She denies any side effects of the medications, any safety issues at this visit   Suicidal Ideation: No Plan Formed: No Patient has means to carry out plan: No  Homicidal Ideation: No Plan Formed: No Patient has means to carry out plan: No  Review of Systems: Psychiatric: Agitation: No Hallucination: Yes Depressed Mood: No Insomnia: Yes Hypersomnia: No Altered Concentration: No Feels Worthless: No Grandiose Ideas: No Belief In Special Powers: Yes New/Increased Substance Abuse: No Compulsions: No Cardiovascular ROS: no chest pain or dyspnea on exertion Respiratory ROS: no cough, shortness of breath, or wheezing Neurologic: Headache: No Seizure: No Paresthesias: No  Past Medical Family, Social History:  Lives with parents in [place], patient attends the adult daycare center 2-3 times a week  Outpatient Encounter Prescriptions as of 10/05/2012  Medication Sig Dispense Refill  . benzonatate (TESSALON) 200 MG capsule Take 1 capsule (200  mg total) by mouth 3 (three) times daily as needed for cough.  40 capsule  1  . donepezil (ARICEPT) 10 MG tablet Take 1 tablet (10 mg total) by mouth at bedtime.  31 tablet  5  . donepezil (ARICEPT) 10 MG tablet TAKE ONE TABLET AT BEDTIME  30 tablet  5  . esomeprazole (NEXIUM) 40 MG capsule Take 40 mg by mouth daily before breakfast.      . ipratropium-albuterol (DUONEB) 0.5-2.5 (3) MG/3ML SOLN Take 3 mLs by nebulization every 6 (six) hours as needed.  360 mL  2  . levofloxacin (LEVAQUIN) 750 MG tablet Take 1 tablet (750 mg total) by mouth daily.  4 tablet  0  . Melatonin 10 MG TABS Take 1 tablet by mouth at bedtime.      . paliperidone (INVEGA) 3 MG 24 hr tablet Take 1 tablet (3 mg total) by mouth at bedtime.  30 tablet  2  . traZODone (DESYREL) 100 MG tablet Take 3 tablets (300 mg total) by mouth at bedtime.  90 tablet  2  . [DISCONTINUED] paliperidone (INVEGA) 3 MG 24 hr tablet Take 3 mg by mouth at bedtime.      . [DISCONTINUED] traZODone (DESYREL) 100 MG tablet Take 300 mg by mouth at bedtime.       No facility-administered encounter medications on file as of 10/05/2012.    Past Psychiatric History/Hospitalization(s): Anxiety: No Bipolar Disorder: No Depression: No Mania: No Psychosis: Yes Schizophrenia: Yes Personality Disorder: No Hospitalization for psychiatric illness: No History of Electroconvulsive Shock Therapy: No Prior Suicide Attempts: No  Physical Exam: Constitutional:  BP 120/62  Ht 5' (1.524 m)  Wt 216 lb 3.2 oz (98.068 kg)  BMI 42.22 kg/m2  General Appearance: alert, oriented, no acute distress and obese  Musculoskeletal: Strength & Muscle Tone: within normal limits Gait & Station: broad based Patient leans: N/A  Psychiatric: Speech (describe rate, volume, coherence, spontaneity, and abnormalities if any):  Slow in rate but normal in volume and spontaneous  Thought Process (describe rate, content, abstract reasoning, and computation):   concrete  Associations: Relevant  Thoughts: delusions and hallucinations  Mental Status: Orientation: oriented to person and place Mood & Affect: normal affect Attention Span & Concentration:  OK Cognition patient is intellectually limited Recent and remote memories are appropriate for patient's intellectual ability Insight and judgment seems to fluctuate between fair to poor as patient is intellectually limited  Medical Decision Making (Choose Three): Established Problem, Stable/Improving (1), Review of Psycho-Social Stressors (1), Review of Last Therapy Session (1) and Review of Medication Regimen & Side Effects (2)  Assessment: Axis I: schizo affective disorder, dementia  Axis II:  deferred  Axis III:  Down syndrome, GERD, status post pneumonia, hydrocephalus  Axis IV:  moderate  Axis V: 50   Plan:  Continue Invega 3 mg but change to one at night for psychosis Continue trazodone 300 mg at bedtime for sleep. Continue diet modification along with Prilosec to help with patient's GERD Continue to take the medications with you were to prevent aspiration Patient to have sleep study done in the next few weeks as patient most likely has sleep apnea Continue to attend adult day center Call when necessary Followup in 3 months  Nelly Rout, MD 10/05/2012

## 2012-10-06 ENCOUNTER — Telehealth: Payer: Self-pay | Admitting: Pediatrics

## 2012-10-06 NOTE — Telephone Encounter (Signed)
I reviewed the swallowing study from Oct 02, 2012.  The patient has mild oral phase, mild and moderate pharyngeal phase dysphagia.  He had the greatest difficulty swallowing a pill with liquid and recommendations the been made to swallow a pill in applesauce, yogurt, or pudding.He had some flash aspiration, but no severe aspiration.  It was problematic that it was silent.  Recommendations were made for slow intake, small sips and bites, seated upright at 90, persistently upright for 30-60 minutes after the meal.  He also had some esophageal dysmotility.

## 2012-10-18 ENCOUNTER — Encounter (HOSPITAL_BASED_OUTPATIENT_CLINIC_OR_DEPARTMENT_OTHER): Payer: Self-pay

## 2012-10-18 ENCOUNTER — Ambulatory Visit (HOSPITAL_BASED_OUTPATIENT_CLINIC_OR_DEPARTMENT_OTHER): Payer: Medicare Other | Attending: Family Medicine | Admitting: Radiology

## 2012-10-18 VITALS — Ht 60.0 in | Wt 214.0 lb

## 2012-10-18 DIAGNOSIS — R0683 Snoring: Secondary | ICD-10-CM

## 2012-10-18 DIAGNOSIS — G4733 Obstructive sleep apnea (adult) (pediatric): Secondary | ICD-10-CM | POA: Insufficient documentation

## 2012-10-18 DIAGNOSIS — Z79899 Other long term (current) drug therapy: Secondary | ICD-10-CM | POA: Diagnosis not present

## 2012-10-18 DIAGNOSIS — G4730 Sleep apnea, unspecified: Secondary | ICD-10-CM

## 2012-10-24 DIAGNOSIS — R0989 Other specified symptoms and signs involving the circulatory and respiratory systems: Secondary | ICD-10-CM | POA: Diagnosis not present

## 2012-10-24 DIAGNOSIS — R0609 Other forms of dyspnea: Secondary | ICD-10-CM | POA: Diagnosis not present

## 2012-10-24 DIAGNOSIS — G4733 Obstructive sleep apnea (adult) (pediatric): Secondary | ICD-10-CM | POA: Diagnosis not present

## 2012-10-24 NOTE — Procedures (Signed)
NAME:  Sergio [name], Sergio [name]                ACCOUNT NO.:  [id number]  MEDICAL RECORD NO.:  [id number]          PATIENT TYPE:  OUT  LOCATION:  SLEEP CENTER                 FACILITY:  [facility]  PHYSICIAN:  Arletha Marschke D. Maple Hudson, MD, FCCP, FACPDATE OF BIRTH:  08/28/85  DATE OF STUDY:  10/18/2012                           NOCTURNAL POLYSOMNOGRAM  REFERRING PHYSICIAN:  Francis P. Modesto Charon, M.D.  REFERRING PHYSICIAN:  Francis P. Modesto Charon, M.D.  INDICATION FOR STUDY:  Hypersomnia with sleep apnea.  EPWORTH SLEEPINESS SCORE:  16/24.  BMI 42, weight 214 pounds, height 60 inches, neck 18 inches.  MEDICATIONS:  Home medications charted and reviewed.  SLEEP ARCHITECTURE SPLIT STUDY PROTOCOL:  During the diagnostic phase, total sleep time 130.5 minutes with sleep efficiency 65.1%.  Stage I was 0.8%, stage II 57.5%, stage III 41.8%, REM absent.  Sleep latency 34 minutes, awake after sleep onset 36 minutes.  Arousal index 12.  BEDTIME MEDICATION:  Trazodone, Invega, donepezil, Nexium, benzonatate, melatonin.  RESPIRATORY DATA SPLIT STUDY PROTOCOL:  Apnea-hypopnea index (AHI) 72.6 per hour.  A total of 158 events was scored including 48 obstructive apneas, 13 mixed apneas, 97 hypopneas.  Events were non-positional. CPAP was titrated to 14 CWP, AHI 0.8 per hour.  He wore a small ResMed Quattro FX full-face mask with heated humidifier.  OXYGEN DATA:  Moderate snoring before CPAP with oxygen desaturation to a nadir of 74%.  The technician added 1 L of oxygen at 11:05 pm for saturation in the lower 80% range during the diagnostic phase.  During CPAP titration, oxygen was increased to a total of 3 L as of 1:39 am because of persistent low oxygen saturation despite CPAP.  CARDIAC DATA:  Normal sinus rhythm.  MOVEMENT/PARASOMNIA:  No significant movement disturbance.  Bathroom x3.  IMPRESSION/RECOMMENDATIONS: 1. Severe obstructive sleep apnea/hypopnea syndrome, AHI 72.6 per hour     with non-positional events.   Moderate snoring. 2. CPAP titrated to 14 CWP, AHI 0.8 per hour.  He wore a small ResMed     Quattro FX full-face mask with heated humidifier.  Snoring was     prevented. 3. On arrival, oxygen saturation was 94% while awake and upright.     During the diagnostic phase, he desaturated to the oxygen     saturation of 74% on room air.  Supplemental oxygen was begun and     because of persistent low oxygen saturation during CPAP, he was     titrated to a final oxygen 3 L/minute through his CPAP mask to hold     oxygen saturation approximately 90%-93%.  It is anticipated that he     will need supplemental oxygen, in addition to CPAP therapy in the     home environment.     Telia Amundson D. Maple Hudson, MD, [facility], FACP Diplomate, American Board of Sleep Medicine    CDY/MEDQ  D:  10/24/2012 10:05:48  T:  10/24/2012 10:20:47  Job:  [id number]

## 2012-12-04 ENCOUNTER — Other Ambulatory Visit: Payer: Self-pay | Admitting: Family Medicine

## 2012-12-04 NOTE — Telephone Encounter (Signed)
Last seen 09/24/12  FPW  If approved call in and route to nurse

## 2012-12-11 ENCOUNTER — Other Ambulatory Visit: Payer: Self-pay | Admitting: Nurse Practitioner

## 2012-12-11 ENCOUNTER — Other Ambulatory Visit: Payer: Self-pay | Admitting: *Deleted

## 2012-12-11 MED ORDER — IPRATROPIUM-ALBUTEROL 0.5-2.5 (3) MG/3ML IN SOLN: 3.0000 mL | Freq: Four times a day (QID) | RESPIRATORY_TRACT | Status: DC | PRN

## 2013-01-06 ENCOUNTER — Other Ambulatory Visit (HOSPITAL_COMMUNITY): Payer: Self-pay | Admitting: Psychiatry

## 2013-01-06 ENCOUNTER — Other Ambulatory Visit: Payer: Self-pay | Admitting: Family Medicine

## 2013-01-07 ENCOUNTER — Ambulatory Visit (HOSPITAL_COMMUNITY): Payer: Medicare Other | Admitting: Psychiatry

## 2013-01-08 NOTE — Telephone Encounter (Signed)
Last seen 09/23/12, last filled 12/04/12

## 2013-01-14 ENCOUNTER — Telehealth: Payer: Self-pay | Admitting: Family Medicine

## 2013-01-14 ENCOUNTER — Ambulatory Visit (INDEPENDENT_AMBULATORY_CARE_PROVIDER_SITE_OTHER): Payer: Medicare Other | Admitting: Nurse Practitioner

## 2013-01-14 ENCOUNTER — Encounter: Payer: Self-pay | Admitting: Nurse Practitioner

## 2013-01-14 ENCOUNTER — Ambulatory Visit (INDEPENDENT_AMBULATORY_CARE_PROVIDER_SITE_OTHER): Payer: Medicare Other

## 2013-01-14 VITALS — BP 105/64 | HR 86 | Temp 100.3°F | Ht 60.0 in | Wt 224.0 lb

## 2013-01-14 DIAGNOSIS — R05 Cough: Secondary | ICD-10-CM | POA: Diagnosis not present

## 2013-01-14 DIAGNOSIS — H6690 Otitis media, unspecified, unspecified ear: Secondary | ICD-10-CM | POA: Diagnosis not present

## 2013-01-14 DIAGNOSIS — H6691 Otitis media, unspecified, right ear: Secondary | ICD-10-CM

## 2013-01-14 DIAGNOSIS — J069 Acute upper respiratory infection, unspecified: Secondary | ICD-10-CM

## 2013-01-14 MED ORDER — CIPROFLOXACIN-DEXAMETHASONE 0.3-0.1 % OT SUSP: 4.0000 [drp] | Freq: Two times a day (BID) | OTIC | Status: DC

## 2013-01-14 MED ORDER — AZITHROMYCIN 250 MG PO TABS: ORAL_TABLET | ORAL | Status: DC

## 2013-01-14 NOTE — Telephone Encounter (Signed)
Fever up to 100.2, cough, and drainage from ears. Symptoms began yesterday. Mother concerned that it will turn into pneumonia. He has taken Tylenol.    Appt scheduled and CXR ordered.

## 2013-01-14 NOTE — Patient Instructions (Signed)

## 2013-01-14 NOTE — Progress Notes (Signed)
Subjective:    Patient ID: Sergio [name], male    DOB: 12-10-1985, 27 y.o.   MRN: [id number]  Fever  Associated symptoms include congestion, coughing (productive) and ear pain.  Cough Associated symptoms include ear pain, a fever and postnasal drip.  Ear Drainage  Associated symptoms include coughing (productive) and ear discharge.   STarted getting a cough last night- right ear drainage that started yesterday and started running a fever this afternoon.    Review of Systems  Constitutional: Positive for fever. Negative for appetite change.  HENT: Positive for ear pain, congestion, postnasal drip, sinus pressure and ear discharge.   Respiratory: Positive for cough (productive).   Cardiovascular: Negative.        Objective:   Physical Exam  Constitutional: He appears well-developed and well-nourished.  HENT:  Right Ear: There is drainage. Tympanic membrane is injected and erythematous.  Left Ear: Hearing, tympanic membrane, external ear and ear canal normal.  Nose: Mucosal edema and rhinorrhea present. Right sinus exhibits no maxillary sinus tenderness and no frontal sinus tenderness. Left sinus exhibits no maxillary sinus tenderness and no frontal sinus tenderness.  Mouth/Throat: Uvula is midline, oropharynx is clear and moist and mucous membranes are normal.  Cardiovascular: Normal rate, regular rhythm and normal heart sounds.   Pulmonary/Chest: Effort normal and breath sounds normal.  Skin: Skin is warm.     BP 105/64  Pulse 86  Temp(Src) 100.3 F (37.9 C)  Ht 5' (1.524 m)  Wt 224 lb (101.606 kg)  BMI 43.75 kg/m2      Assessment & Plan:   1. Cough   2. Upper respiratory infection   3. Otitis media, right    Orders Placed This Encounter  Procedures  . DG Chest 2 View    Standing Status: Future     Number of Occurrences: 1     Standing Expiration Date: 03/16/2014    Order Specific Question:  Reason for Exam (SYMPTOM  OR DIAGNOSIS REQUIRED)    Answer:   cough    Order Specific Question:  Preferred imaging location?    Answer:  Internal   Meds ordered this encounter  Medications  . ciprofloxacin-dexamethasone (CIPRODEX) otic suspension    Sig: Place 4 drops into the right ear 2 (two) times daily.    Dispense:  7.5 mL    Refill:  0    Order Specific Question:  Supervising Provider    Answer:  Ernestina Penna [[id number]]  . azithromycin (ZITHROMAX) 250 MG tablet    Sig: As directed    Dispense:  6 tablet    Refill:  0    Order Specific Question:  Supervising Provider    Answer:  Ernestina Penna [[id number]]   1. Take meds as prescribed 2. Use a cool mist humidifier especially during the winter months and when heat has  been humid. 3. Use saline nose sprays frequently 4. Saline irrigations of the nose can be very helpful if done frequently.  * 4X daily for 1 week*  * Use of a nettie pot can be helpful with this. Follow directions with this* 5. Drink plenty of fluids 6. Keep thermostat turn down low 7.For any cough or congestion  Use plain Mucinex- regular strength or max strength is fine   * Children- consult with Pharmacist for dosing 8. For fever or aces or pains- take tylenol or ibuprofen appropriate for age and weight.  * for fevers greater than 101 orally you may alternate ibuprofen and tylenol  every  3 hours.   RTO prn  Mary-Margaret Daphine Deutscher, FNP

## 2013-01-21 ENCOUNTER — Ambulatory Visit (INDEPENDENT_AMBULATORY_CARE_PROVIDER_SITE_OTHER): Payer: Medicare Other | Admitting: Family Medicine

## 2013-01-21 ENCOUNTER — Encounter: Payer: Self-pay | Admitting: Family Medicine

## 2013-01-21 ENCOUNTER — Ambulatory Visit (HOSPITAL_COMMUNITY): Payer: Self-pay | Admitting: Psychiatry

## 2013-01-21 VITALS — BP 106/64 | HR 65 | Temp 97.8°F | Ht 61.0 in | Wt 224.8 lb

## 2013-01-21 DIAGNOSIS — H60391 Other infective otitis externa, right ear: Secondary | ICD-10-CM

## 2013-01-21 DIAGNOSIS — G911 Obstructive hydrocephalus: Secondary | ICD-10-CM

## 2013-01-21 DIAGNOSIS — D352 Benign neoplasm of pituitary gland: Secondary | ICD-10-CM

## 2013-01-21 DIAGNOSIS — E559 Vitamin D deficiency, unspecified: Secondary | ICD-10-CM | POA: Diagnosis not present

## 2013-01-21 DIAGNOSIS — R5381 Other malaise: Secondary | ICD-10-CM | POA: Diagnosis not present

## 2013-01-21 DIAGNOSIS — F0390 Unspecified dementia without behavioral disturbance: Secondary | ICD-10-CM

## 2013-01-21 DIAGNOSIS — G4710 Hypersomnia, unspecified: Secondary | ICD-10-CM | POA: Diagnosis not present

## 2013-01-21 DIAGNOSIS — R635 Abnormal weight gain: Secondary | ICD-10-CM

## 2013-01-21 DIAGNOSIS — J45909 Unspecified asthma, uncomplicated: Secondary | ICD-10-CM | POA: Diagnosis not present

## 2013-01-21 DIAGNOSIS — K219 Gastro-esophageal reflux disease without esophagitis: Secondary | ICD-10-CM

## 2013-01-21 DIAGNOSIS — H60399 Other infective otitis externa, unspecified ear: Secondary | ICD-10-CM

## 2013-01-21 DIAGNOSIS — D353 Benign neoplasm of craniopharyngeal duct: Secondary | ICD-10-CM

## 2013-01-21 DIAGNOSIS — R5383 Other fatigue: Secondary | ICD-10-CM

## 2013-01-21 DIAGNOSIS — T38815A Adverse effect of anterior pituitary [adenohypophyseal] hormones, initial encounter: Secondary | ICD-10-CM

## 2013-01-21 DIAGNOSIS — G919 Hydrocephalus, unspecified: Secondary | ICD-10-CM

## 2013-01-21 DIAGNOSIS — F259 Schizoaffective disorder, unspecified: Secondary | ICD-10-CM

## 2013-01-21 DIAGNOSIS — G4733 Obstructive sleep apnea (adult) (pediatric): Secondary | ICD-10-CM | POA: Insufficient documentation

## 2013-01-21 LAB — POCT CBC
Granulocyte percent: 57.2 %G (ref 37–80)
HCT, POC: 49.4 % (ref 43.5–53.7)
Hemoglobin: 16.3 g/dL (ref 14.1–18.1)
Lymph, poc: 2.3 (ref 0.6–3.4)
MCH, POC: 30.6 pg (ref 27–31.2)
MCHC: 33.1 g/dL (ref 31.8–35.4)
MCV: 92.4 fL (ref 80–97)
MPV: 7.3 fL (ref 0–99.8)
POC Granulocyte: 3.4 (ref 2–6.9)
POC LYMPH PERCENT: 38.7 %L (ref 10–50)
Platelet Count, POC: 220 10*3/uL (ref 142–424)
RBC: 5.3 M/uL (ref 4.69–6.13)
RDW, POC: 14.2 %
WBC: 5.9 10*3/uL (ref 4.6–10.2)

## 2013-01-21 MED ORDER — IPRATROPIUM-ALBUTEROL 0.5-2.5 (3) MG/3ML IN SOLN: 3.0000 mL | Freq: Four times a day (QID) | RESPIRATORY_TRACT | Status: DC | PRN

## 2013-01-21 MED ORDER — OFLOXACIN 0.3 % OT SOLN: 5.0000 [drp] | Freq: Every day | OTIC | Status: DC

## 2013-01-21 NOTE — Progress Notes (Signed)
Patient ID: Sergio [name], male   DOB: 07-10-1985, 27 y.o.   MRN: [id number] SUBJECTIVE: CC: Chief Complaint  Patient presents with  . Follow-up    discuss cpap results and mom wants labs     HPI: OSA: on CPAP 1 1 /2 months doing wonderful.  Swallowing study:  Doing nebulizer twice a day. Had a cold last week. Had to get antibiotics and had ear drops. Needs a different ear drop because his ear is still sore and the other one was not covered by the insurance company. He has been fatigued and mom thinks he needs to check for Vitamin D Deficiency. Also its time to follow up on his pituitary tumor and the prolactin.  Past Medical History  Diagnosis Date  . Down's syndrome   . GERD (gastroesophageal reflux disease)   . Hydrocephalus   . Schizoaffective disorder   . Dementia   . Memory loss   . Headache(784.0)   . Difficulty swallowing   . Weakness   . Insomnia   . Dysphagia causing pulmonary aspiration with swallowing   . OSA (obstructive sleep apnea)   . Pituitary adenoma    Past Surgical History  Procedure Laterality Date  . Csf shunt    . Brain surgery    . Ventriculoperitoneal shunt     History   Social History  . Marital Status: Single    Spouse Name: N/A    Number of Children: N/A  . Years of Education: N/A   Occupational History  . Not on file.   Social History Main Topics  . Smoking status: Never Smoker   . Smokeless tobacco: Never Used  . Alcohol Use: No  . Drug Use: No  . Sexual Activity: Not on file   Other Topics Concern  . Not on file   Social History Narrative  . No narrative on file   Family History  Problem Relation Age of Onset  . Hypertension Mother   . Renal Disease Father   . Emphysema Maternal Grandfather     Died at 3   Current Outpatient Prescriptions on File Prior to Visit  Medication Sig Dispense Refill  . benzonatate (TESSALON) 200 MG capsule TAKE 1 CAPSULE THREE TIMES DAILY AS NEEDED FOR COUGH  40 capsule  0  .  donepezil (ARICEPT) 10 MG tablet Take 1 tablet (10 mg total) by mouth at bedtime.  31 tablet  5  . donepezil (ARICEPT) 10 MG tablet TAKE ONE TABLET AT BEDTIME  30 tablet  5  . esomeprazole (NEXIUM) 40 MG capsule Take 40 mg by mouth daily before breakfast.      . INVEGA 3 MG 24 hr tablet TAKE ONE TABLET AT BEDTIME  30 tablet  2  . Melatonin 10 MG TABS Take 1 tablet by mouth at bedtime.      . traZODone (DESYREL) 100 MG tablet TAKE 3 TABLETS AT BEDTIME  90 tablet  2   No current facility-administered medications on file prior to visit.   Allergies  Allergen Reactions  . Ceclor [Cefaclor] Anaphylaxis  . Penicillins Anaphylaxis    There is no immunization history on file for this patient. Prior to Admission medications   Medication Sig Start Date End Date Taking? Authorizing Provider  benzonatate (TESSALON) 200 MG capsule TAKE 1 CAPSULE THREE TIMES DAILY AS NEEDED FOR COUGH 01/06/13   Sergio Ladd, MD  donepezil (ARICEPT) 10 MG tablet Take 1 tablet (10 mg total) by mouth at bedtime. 09/22/12  Sergio Perla, MD  donepezil (ARICEPT) 10 MG tablet TAKE ONE TABLET AT BEDTIME 10/05/12   Sergio Rising, NP  esomeprazole (NEXIUM) 40 MG capsule Take 40 mg by mouth daily before breakfast.    Historical Provider, MD  INVEGA 3 MG 24 hr tablet TAKE ONE TABLET AT BEDTIME 01/06/13   Sergio Rout, MD  ipratropium-albuterol (DUONEB) 0.5-2.5 (3) MG/3ML SOLN Take 3 mLs by nebulization every 6 (six) hours as needed. Dx 493.90 01/21/13   Sergio Ladd, MD  Melatonin 10 MG TABS Take 1 tablet by mouth at bedtime.    Historical Provider, MD  ofloxacin (FLOXIN) 0.3 % otic solution Place 5 drops into the right ear daily. 01/21/13   Sergio Ladd, MD  traZODone (DESYREL) 100 MG tablet TAKE 3 TABLETS AT BEDTIME 01/06/13   Sergio Rout, MD     ROS: As above in the HPI. All other systems are stable or negative.  OBJECTIVE: APPEARANCE:  Patient in no acute distress.The patient appeared well nourished and  normally developed. Acyanotic. Waist: VITAL SIGNS:BP 106/64  Pulse 65  Temp(Src) 97.8 F (36.6 C) (Oral)  Ht 5\' 1"  (1.549 m)  Wt 224 lb 12.8 oz (101.969 kg)  BMI 42.5 kg/m2 WM obese has had significant weight gain attributable to high fat and sugars from the dad.  SKIN: warm and  Dry without overt rashes, tattoos and scars  HEAD and Neck: without JVD, Head and scalp: Features of Down's syndrome Eyes:No scleral icterus. Fundi normal, eye movements normal. Ears: Auricle normal, right canal abnormal with swelling and redness, Tympanic membranes normal, insufflation normal. Nose: normal Throat: normal Neck & thyroid: normal Has the shunt for hydrocephalus in place.  CHEST & LUNGS: Chest wall: normal Lungs: Clear  CVS: Reveals the PMI to be normally located. Regular rhythm, First and Second Heart sounds are normal,  absence of murmurs, rubs or gallops. Peripheral vasculature: Radial pulses: normal Dorsal pedis pulses: normal Posterior pulses: normal  ABDOMEN:  Appearance:obese Benign, no organomegaly, no masses, no Abdominal Aortic enlargement. No Guarding , no rebound. No Bruits. Bowel sounds: normal  RECTAL: N/A GU: N/A  EXTREMETIES: nonedematous.   NEUROLOGIC: oriented to,place and person; no changes  NAME: Sergio, [name] ACCOUNT NO.: [id number]  MEDICAL RECORD NO.: [id number] PATIENT TYPE: OUT  LOCATION: SLEEP CENTER FACILITY[facility]  PHYSICIAN: Sergio D. Maple Hudson, MD, FCCP, FACPDATE OF BIRTH: October 15, 1985  DATE OF STUDY: 10/18/2012  NOCTURNAL POLYSOMNOGRAM  REFERRING PHYSICIAN: Mahogony Gilchrest P. Modesto [name], M.D.  REFERRING PHYSICIAN: Hulon Ferron P. Modesto [name], M.D.  INDICATION FOR STUDY: Hypersomnia with sleep apnea.  EPWORTH SLEEPINESS SCORE: 16/24. BMI 42, weight 214 pounds, height 60  inches, neck 18 inches.  MEDICATIONS: Home medications charted and reviewed.  SLEEP ARCHITECTURE SPLIT STUDY PROTOCOL: During the diagnostic phase,  total sleep time 130.5 minutes with sleep efficiency  65.1%. Stage I was  0.8%, stage II 57.5%, stage [name] 41.8%, REM absent. Sleep latency 34  minutes, awake after sleep onset 36 minutes. Arousal index 12.  BEDTIME MEDICATION: Trazodone, Invega, donepezil, Nexium, benzonatate,  melatonin.  RESPIRATORY DATA SPLIT STUDY PROTOCOL: Apnea-hypopnea index (AHI) 72.6  per hour. A total of 158 events was scored including 48 obstructive  apneas, 13 mixed apneas, 97 hypopneas. Events were non-positional.  CPAP was titrated to 14 CWP, AHI 0.8 per hour. He wore a small ResMed  Quattro FX full-face mask with heated humidifier.  OXYGEN DATA: Moderate snoring before CPAP with oxygen desaturation to a  nadir of 74%. The technician added 1 L of oxygen at  11:05 pm for  saturation in the lower 80% range during the diagnostic phase. During  CPAP titration, oxygen was increased to a total of 3 L as of 1:39 am  because of persistent low oxygen saturation despite CPAP.  CARDIAC DATA: Normal sinus rhythm.  MOVEMENT/PARASOMNIA: No significant movement disturbance. Bathroom x3.  IMPRESSION/RECOMMENDATIONS:  1. Severe obstructive sleep apnea/hypopnea syndrome, AHI 72.6 per hour  with non-positional events. Moderate snoring.  2. CPAP titrated to 14 CWP, AHI 0.8 per hour. He wore a small ResMed  Quattro FX full-face mask with heated humidifier. Snoring was  prevented.  3. On arrival, oxygen saturation was 94% while awake and upright.  During the diagnostic phase, he desaturated to the oxygen  saturation of 74% on room air. Supplemental oxygen was begun and  because of persistent low oxygen saturation during CPAP, he was  titrated to a final oxygen 3 L/minute through his CPAP mask to hold  oxygen saturation approximately 90%-93%. It is anticipated that he  will need supplemental oxygen, in addition to CPAP therapy in the  home environment.  Sergio D. Young, MD, FCCP, FACP  Diplomate, Biomedical engineer of Sleep Medicine   ASSESSMENT: Hypersomnia with sleep apnea,  unspecified  Unspecified asthma(493.90) - Plan: ipratropium-albuterol (DUONEB) 0.5-2.5 (3) MG/3ML SOLN  GERD (gastroesophageal reflux disease)  Hydrocephalus  Schizoaffective disorder  Anterior pituitary hormones causing adverse effect in therapeutic use  Dementia - Plan: POCT CBC, Vit D  25 hydroxy (rtn osteoporosis monitoring)  Pituitary adenoma - Plan: CMP14+EGFR, TSH, Prolactin  Otitis, externa, infective, right - Plan: ofloxacin (FLOXIN) 0.3 % otic solution  Weight gain - Plan: TSH, Vit D  25 hydroxy (rtn osteoporosis monitoring), Prolactin  Other malaise and fatigue - Plan: POCT CBC, Vit D  25 hydroxy (rtn osteoporosis monitoring)   PLAN:  Orders Placed This Encounter  Procedures  . CMP14+EGFR  . TSH  . Vit D  25 hydroxy (rtn osteoporosis monitoring)  . Prolactin  . POCT CBC    Meds ordered this encounter  Medications  . ofloxacin (FLOXIN) 0.3 % otic solution    Sig: Place 5 drops into the right ear daily.    Dispense:  10 mL    Refill:  0  . ipratropium-albuterol (DUONEB) 0.5-2.5 (3) MG/3ML SOLN    Sig: Take 3 mLs by nebulization every 6 (six) hours as needed. Dx 493.90    Dispense:  360 mL    Refill:  5    Diagnosis code 493.90 Maintenance treatment    Order Specific Question:  Supervising Provider    Answer:  Ernestina Penna [[id number]]    Results for orders placed in visit on 01/21/13  POCT CBC      Result Value Range   WBC 5.9  4.6 - 10.2 K/uL   Lymph, poc 2.3  0.6 - 3.4   POC LYMPH PERCENT 38.7  10 - 50 %L   POC Granulocyte 3.4  2 - 6.9   Granulocyte percent 57.2  37 - 80 %G   RBC 5.3  4.69 - 6.13 M/uL   Hemoglobin 16.3  14.1 - 18.1 g/dL   HCT, POC 78.2  95.6 - 53.7 %   MCV 92.4  80 - 97 fL   MCH, POC 30.6  27 - 31.2 pg   MCHC 33.1  31.8 - 35.4 g/dL   RDW, POC 21.3     Platelet Count, POC 220.0  142 - 424 K/uL   MPV 7.3  0 - 99.8 fL  Low fat , low sugar diet. Follow up with Dr Alvester Morin the neurologist due.mom to make the appointment.  Return  in about 3 months (around 04/22/2013) for Recheck medical problems.  Mayana Irigoyen P. Modesto [name], M.D.

## 2013-01-22 LAB — CMP14+EGFR
ALT: 31 IU/L (ref 0–44)
AST: 16 IU/L (ref 0–40)
Albumin/Globulin Ratio: 1.6 (ref 1.1–2.5)
Albumin: 4.2 g/dL (ref 3.5–5.5)
Alkaline Phosphatase: 59 IU/L (ref 39–117)
BUN/Creatinine Ratio: 20 — ABNORMAL HIGH (ref 8–19)
BUN: 22 mg/dL — ABNORMAL HIGH (ref 6–20)
CO2: 24 mmol/L (ref 18–29)
Calcium: 9.4 mg/dL (ref 8.7–10.2)
Chloride: 98 mmol/L (ref 97–108)
Creatinine, Ser: 1.11 mg/dL (ref 0.76–1.27)
GFR calc Af Amer: 105 mL/min/{1.73_m2} (ref >59)
GFR calc non Af Amer: 91 mL/min/{1.73_m2} (ref >59)
Globulin, Total: 2.7 g/dL (ref 1.5–4.5)
Glucose: 104 mg/dL — ABNORMAL HIGH (ref 65–99)
Potassium: 4.9 mmol/L (ref 3.5–5.2)
Sodium: 139 mmol/L (ref 134–144)
Total Bilirubin: 0.3 mg/dL (ref 0.0–1.2)
Total Protein: 6.9 g/dL (ref 6.0–8.5)

## 2013-01-22 LAB — TSH: TSH: 3.02 u[IU]/mL (ref 0.450–4.500)

## 2013-01-22 LAB — PROLACTIN: Prolactin: 51.5 ng/mL — ABNORMAL HIGH (ref 4.0–15.2)

## 2013-01-22 LAB — VITAMIN D 25 HYDROXY (VIT D DEFICIENCY, FRACTURES): Vit D, 25-Hydroxy: 14 ng/mL — ABNORMAL LOW (ref 30.0–100.0)

## 2013-01-23 ENCOUNTER — Other Ambulatory Visit: Payer: Self-pay | Admitting: Family Medicine

## 2013-01-23 DIAGNOSIS — E559 Vitamin D deficiency, unspecified: Secondary | ICD-10-CM | POA: Insufficient documentation

## 2013-01-23 MED ORDER — ERGOCALCIFEROL 1.25 MG (50000 UT) PO CAPS: 50000.0000 [IU] | ORAL_CAPSULE | ORAL | Status: DC

## 2013-01-23 NOTE — Progress Notes (Signed)
Quick Note:  Labs abnormal. Prolactin is high. Mom needs to make the appointment to Dr Alvester Morin as we discussed for follow up on his pituitary problem Vitamin D level is too low. Will need to replace with Rx weekly and then switch to about 4000 units daily. thyroid is good ______

## 2013-02-04 ENCOUNTER — Other Ambulatory Visit: Payer: Self-pay | Admitting: Nurse Practitioner

## 2013-02-08 ENCOUNTER — Other Ambulatory Visit: Payer: Self-pay | Admitting: Neurosurgery

## 2013-02-08 DIAGNOSIS — D497 Neoplasm of unspecified behavior of endocrine glands and other parts of nervous system: Secondary | ICD-10-CM

## 2013-02-08 DIAGNOSIS — G911 Obstructive hydrocephalus: Secondary | ICD-10-CM

## 2013-02-09 ENCOUNTER — Ambulatory Visit (INDEPENDENT_AMBULATORY_CARE_PROVIDER_SITE_OTHER): Payer: Medicare Other | Admitting: Psychiatry

## 2013-02-09 ENCOUNTER — Encounter (HOSPITAL_COMMUNITY): Payer: Self-pay | Admitting: Psychiatry

## 2013-02-09 VITALS — BP 120/66 | Ht 61.0 in | Wt 226.6 lb

## 2013-02-09 DIAGNOSIS — F259 Schizoaffective disorder, unspecified: Secondary | ICD-10-CM

## 2013-02-09 DIAGNOSIS — F0390 Unspecified dementia without behavioral disturbance: Secondary | ICD-10-CM | POA: Diagnosis not present

## 2013-02-09 DIAGNOSIS — F209 Schizophrenia, unspecified: Secondary | ICD-10-CM

## 2013-02-09 MED ORDER — PALIPERIDONE ER 3 MG PO TB24: ORAL_TABLET | ORAL | Status: DC

## 2013-02-09 MED ORDER — TRAZODONE HCL 100 MG PO TABS: ORAL_TABLET | ORAL | Status: DC

## 2013-02-11 ENCOUNTER — Ambulatory Visit
Admission: RE | Admit: 2013-02-11 | Discharge: 2013-02-11 | Disposition: A | Discharge status: Home / Self Care | Payer: Medicare Other | Source: Ambulatory Visit | Attending: Neurosurgery | Admitting: Neurosurgery

## 2013-02-11 DIAGNOSIS — R51 Headache: Secondary | ICD-10-CM | POA: Diagnosis not present

## 2013-02-11 DIAGNOSIS — D497 Neoplasm of unspecified behavior of endocrine glands and other parts of nervous system: Secondary | ICD-10-CM

## 2013-02-11 DIAGNOSIS — G911 Obstructive hydrocephalus: Secondary | ICD-10-CM

## 2013-02-11 NOTE — Progress Notes (Signed)
Patient ID: Sergio [name], male   DOB: 1985/09/21, 27 y.o.   MRN: [id number]  [facility] [address] Progress Note  Sergio [name] [id number] 27 y.o.  02/11/2013 10:19 AM  Chief Complaint: I am doing OK but I'm tired this morning as I did not sleeping well last night  History of Present Illness: Patient is a 27 year old diagnosed with schizoaffective disorder, Down syndrome, GERD and hydrocephalus who presents today for a followup visit.  Mom reports that the patient's prolactin level is elevated, adds that he's going to get a CT scan of his head and is to see his neurosurgeon at [facility] after that. He states that his prolactin level currently is at 51.5 and it was found 25 last year. On being questioned if the patient was having any problems with dizziness, visual changes, gait changes, headaches, both patient and mom denied.  Aunt states that the patient still takes his medications with yogurt and because of that has not had any aspiration issues.   Mom reports that the patient is sleeping well and is now he has a CPAP machine and it has helped greatly with his sleep. She reports that he did not sleep well last night as both he and his dad had caffeinated drinks and that interferes with patient's sleep  In regards to her daily activities, mom reports that patient still attends the adult center 3-4 times a week and continues to have [employer].  In regards to this hallucinations, mom reports that he does still complains about the " weirdo's" does but overall seems to be doing fairly well. She adds that he's been stable on the [facility] for a few years now and she is hoping that she does not have to change the medication because of the increased prolactin.   She denies any side effects of the medications, any other complaints, any safety issues at this visit   Suicidal Ideation: No Plan Formed: No Patient has means to carry out plan: No  Homicidal Ideation: No Plan Formed: No  Patient has means to carry out plan: No  Review of Systems: Psychiatric: Agitation: No Hallucination: Yes Depressed Mood: No Insomnia: Yes Hypersomnia: No Altered Concentration: No Feels Worthless: No Grandiose Ideas: No Belief In Special Powers: Yes New/Increased Substance Abuse: No Compulsions: No Cardiovascular ROS: no chest pain or dyspnea on exertion Respiratory ROS: no cough, shortness of breath, or wheezing Neurologic: Headache: No Seizure: No Paresthesias: No Musculoskeletal ROS: negative for - gait disturbance Endocrine ROS: negative for - breast changes, galactorrhea, polydipsia/polyuria, skin changes, temperature intolerance or unexpected weight changes Past Medical Family, Social History:  Lives with parents in [place], patient attends the adult daycare center 2-3 times a week  Outpatient Encounter Prescriptions as of 02/09/2013  Medication Sig Dispense Refill  . benzonatate (TESSALON) 200 MG capsule TAKE 1 CAPSULE THREE TIMES DAILY AS NEEDED FOR COUGH  40 capsule  0  . benzonatate (TESSALON) 200 MG capsule TAKE 1 CAPSULE THREE TIMES DAILY AS NEEDED FOR COUGH  40 capsule  0  . donepezil (ARICEPT) 10 MG tablet Take 1 tablet (10 mg total) by mouth at bedtime.  31 tablet  5  . donepezil (ARICEPT) 10 MG tablet TAKE ONE TABLET AT BEDTIME  30 tablet  5  . ergocalciferol (VITAMIN D2) 50000 UNITS capsule Take 1 capsule (50,000 Units total) by mouth once a week.  12 capsule  0  . esomeprazole (NEXIUM) 40 MG capsule Take 40 mg by mouth daily before  breakfast.      . ipratropium-albuterol (DUONEB) 0.5-2.5 (3) MG/3ML SOLN Take 3 mLs by nebulization every 6 (six) hours as needed. Dx 493.90  360 mL  5  . Melatonin 10 MG TABS Take 1 tablet by mouth at bedtime.      Marland Kitchen ofloxacin (FLOXIN) 0.3 % otic solution Place 5 drops into the right ear daily.  10 mL  0  . paliperidone (INVEGA) 3 MG 24 hr tablet TAKE ONE TABLET AT BEDTIME  30 tablet  2  . traZODone (DESYREL) 100 MG tablet  TAKE 3 TABLETS AT BEDTIME  90 tablet  2  . [DISCONTINUED] INVEGA 3 MG 24 hr tablet TAKE ONE TABLET AT BEDTIME  30 tablet  2  . [DISCONTINUED] traZODone (DESYREL) 100 MG tablet TAKE 3 TABLETS AT BEDTIME  90 tablet  2   No facility-administered encounter medications on file as of 02/09/2013.    Past Psychiatric History/Hospitalization(s): Anxiety: No Bipolar Disorder: No Depression: No Mania: No Psychosis: Yes Schizophrenia: Yes Personality Disorder: No Hospitalization for psychiatric illness: No History of Electroconvulsive Shock Therapy: No Prior Suicide Attempts: No  Physical Exam: Constitutional:  BP 120/66  Ht 5\' 1"  (1.549 m)  Wt 226 lb 9.6 oz (102.785 kg)  BMI 42.84 kg/m2  General Appearance: alert, oriented, no acute distress and obese  Musculoskeletal: Strength & Muscle Tone: within normal limits Gait & Station: broad based Patient leans: N/A  Psychiatric: Speech (describe rate, volume, coherence, spontaneity, and abnormalities if any):  Slow in rate but normal in volume and spontaneous  Thought Process (describe rate, content, abstract reasoning, and computation):  concrete  Associations: Relevant  Thoughts: delusions and hallucinations  Mental Status: Orientation: oriented to person and place Mood & Affect: normal affect and appears tired today Attention Span & Concentration:  OK Cognition patient is intellectually limited Recent and remote memories are appropriate for patient's intellectual ability Insight and judgment seems to fluctuate between fair to poor as patient is intellectually limited  Medical Decision Making (Choose Three): Established Problem, Stable/Improving (1), New problem, with additional work up planned, Review of Psycho-Social Stressors (1), Review or order clinical lab tests (1), Review and summation of old records (2), Review of Last Therapy Session (1) and Review of Medication Regimen & Side Effects (2)  Assessment: Axis I: schizo  affective disorder, dementia  Axis II:  deferred  Axis [name]:  Down syndrome, GERD, status post pneumonia, hydrocephalus  Axis IV:  moderate  Axis V: 50   Plan:  Continue Invega 3 mg but change to one at night for psychosis Continue trazodone 300 mg at bedtime for sleep. Continue diet modification along with Prilosec to help with patient's GERD Continue to take the medications with yogurt to prevent aspiration Continue CPAP machine Patient to have CT scan and then followup with his neurosurgeon secondary to the increase of his prolactin level from 25-51.5 Continue to attend adult day center Call when necessary Followup in 2 months This followup visit was of high medical complexity, summation of records, review of data, detailed examination along with a plan was discussed at this visit Nelly Rout, MD 02/11/2013

## 2013-02-22 DIAGNOSIS — G911 Obstructive hydrocephalus: Secondary | ICD-10-CM | POA: Diagnosis not present

## 2013-02-26 ENCOUNTER — Encounter: Payer: Self-pay | Admitting: Family Medicine

## 2013-02-26 ENCOUNTER — Encounter (INDEPENDENT_AMBULATORY_CARE_PROVIDER_SITE_OTHER): Payer: Self-pay

## 2013-02-26 ENCOUNTER — Ambulatory Visit (INDEPENDENT_AMBULATORY_CARE_PROVIDER_SITE_OTHER): Payer: Medicare Other | Admitting: Family Medicine

## 2013-02-26 ENCOUNTER — Telehealth: Payer: Self-pay | Admitting: Nurse Practitioner

## 2013-02-26 VITALS — BP 113/64 | HR 77 | Temp 98.0°F | Ht 61.0 in | Wt 224.0 lb

## 2013-02-26 DIAGNOSIS — J209 Acute bronchitis, unspecified: Secondary | ICD-10-CM

## 2013-02-26 MED ORDER — LEVOFLOXACIN 500 MG PO TABS: 500.0000 mg | ORAL_TABLET | Freq: Every day | ORAL | Status: DC

## 2013-02-26 NOTE — Patient Instructions (Signed)

## 2013-02-26 NOTE — Progress Notes (Signed)
  Subjective:    Patient ID: Sergio [name], male    DOB: Apr 10, 1986, 27 y.o.   MRN: [id number]  HPI This 27 y.o. male presents for evaluation of cough and congestion for a week.   Review of Systems No chest pain, SOB, HA, dizziness, vision change, N/V, diarrhea, constipation, dysuria, urinary urgency or frequency, myalgias, arthralgias or rash.     Objective:   Physical Exam Vital signs noted  Well developed well nourished male.  HEENT - Head atraumatic Normocephalic                Eyes - PERRLA, Conjuctiva - clear Sclera- Clear EOMI                Ears - EAC's Wnl TM's Wnl Gross Hearing WNL                Nose - Nares patent                 Throat - oropharanx wnl Respiratory - Lungs wheezes bilateral Cardiac - RRR S1 and S2 without murmur GI - Abdomen soft Nontender and bowel sounds active x 4 Extremities - No edema. Neuro - Grossly intact.       Assessment & Plan:  Acute bronchitis - Plan: levofloxacin (LEVAQUIN) 500 MG tablet po qd x 10 days #10 Mucinex 600mg  one po bid, push po fluids, rest, and follow up prn.  Deatra Canter FNP

## 2013-02-26 NOTE — Telephone Encounter (Signed)
Appt given for today 

## 2013-03-02 ENCOUNTER — Telehealth: Payer: Self-pay | Admitting: Pediatrics

## 2013-03-03 DIAGNOSIS — D497 Neoplasm of unspecified behavior of endocrine glands and other parts of nervous system: Secondary | ICD-10-CM | POA: Diagnosis not present

## 2013-03-03 NOTE — Telephone Encounter (Signed)
I spoke with mother about an MRI scan to be done to look for an abnormal pituitary.  This was scheduled for today in [place] at the [facility] MRI scanner.

## 2013-03-05 ENCOUNTER — Telehealth: Payer: Self-pay | Admitting: Pediatrics

## 2013-03-05 NOTE — Telephone Encounter (Signed)
MRI scan shows an oddly shaped sella turcica but somewhat smaller pituitary than was present in 2006, and no evidence of tumor.  I was able believe this message because the family identified themselves on voicemail.  I told them to call on Monday if they have further questions.

## 2013-03-09 ENCOUNTER — Other Ambulatory Visit: Payer: Self-pay

## 2013-03-09 MED ORDER — ESOMEPRAZOLE MAGNESIUM 40 MG PO CPDR: 40.0000 mg | DELAYED_RELEASE_CAPSULE | Freq: Every day | ORAL | Status: DC

## 2013-04-05 ENCOUNTER — Ambulatory Visit (INDEPENDENT_AMBULATORY_CARE_PROVIDER_SITE_OTHER): Payer: Medicare Other | Admitting: Pediatrics

## 2013-04-05 ENCOUNTER — Encounter: Payer: Self-pay | Admitting: Pediatrics

## 2013-04-05 VITALS — BP 96/66 | HR 60 | Ht 60.5 in | Wt 222.6 lb

## 2013-04-05 DIAGNOSIS — E669 Obesity, unspecified: Secondary | ICD-10-CM

## 2013-04-05 DIAGNOSIS — Q909 Down syndrome, unspecified: Secondary | ICD-10-CM | POA: Diagnosis not present

## 2013-04-05 DIAGNOSIS — G911 Obstructive hydrocephalus: Secondary | ICD-10-CM

## 2013-04-05 DIAGNOSIS — F02818 Dementia in other diseases classified elsewhere, unspecified severity, with other behavioral disturbance: Secondary | ICD-10-CM

## 2013-04-05 DIAGNOSIS — F0281 Dementia in other diseases classified elsewhere with behavioral disturbance: Secondary | ICD-10-CM

## 2013-04-05 MED ORDER — DONEPEZIL HCL 10 MG PO TABS: ORAL_TABLET | ORAL | Status: DC

## 2013-04-05 NOTE — Progress Notes (Signed)
Patient: Sergio [name] MRN: [id number] Sex: male DOB: [birth year]  Provider: Deetta Perla, MD Location of Care: [facility]  Note type: Routine return visit  History of Present Illness: Referral Source: Dr. Nelly Rout History from: father and [facility] chart Chief Complaint: Trisomy 21  Sergio [name] is a 27 y.o. male who returns for evaluation and management of neurologic manifestations of trisomy 21, congenital hydrocephalus, partial agenesis of the corpus callosum, history of pituitary microadenoma with elevated prolactin levels.  "Sergio [name]" returns April 05, 2013 for the first time since Sep 22, 2012.  He has trisomy 21, congenital hydrocephalus, partial agenesis of the corpus callosum, history of schizophrenia with hallucinations, and a history of a 3 x 5 mm pituitary adenoma with an elevated prolactin level.  His most recent MRI scan from March 03, 2013 performed at the [facility] failed to show evidence of a pituitary adenoma. This was done with appropriate screening of his pituitary.  The sella turcica was elongated at 15 mm in AP diameter and only 8 mm in transverse diameter, 10 mm in height.  Rest of the brain shows agenesis of the posterior aspect of the corpus callosum including the spleen. No other abnormality was seen.  The patient's most recent prolactin level was 51 mcg/mL.  His biggest health problem is his morbid obesity.  He gained another 8.6 pounds.  His parents lock the refrigerators, lock the cabinets, and keep no food around that he can eat.  Nonetheless, he still manages to gain weight.  He will not physically exercise.  The patient seems to be sleeping better and will sleep for up to 3 to 4 hours during the nighttime.  When awake, he may be up for 10 to 45 minutes.  He brothers his mother trying to get her to feed him and she has refused to do so.  Review of Systems: 12 system review was unremarkable  Past Medical History   Diagnosis Date  . Down's syndrome   . GERD (gastroesophageal reflux disease)   . Hydrocephalus   . Schizoaffective disorder   . Dementia   . Memory loss   . Headache(784.0)   . Difficulty swallowing   . Weakness   . Insomnia   . Dysphagia causing pulmonary aspiration with swallowing   . OSA (obstructive sleep apnea)   . Pituitary adenoma    Hospitalizations: yes, Head Injury: no, Nervous System Infections: no, Immunizations up to date: yes Past Medical History Comments: Patient had several hospitalizations due to surgeries and pneumonia.  Birth History 4 lbs. 10 oz. Infant born at [redacted] weeks gestational age  Behavior History none  Surgical History Past Surgical History  Procedure Laterality Date  . Csf shunt    . Brain surgery    . Ventriculoperitoneal shunt    . Tympanostomy tube placement Bilateral     Family History family history includes Emphysema in his maternal grandfather; Hypertension in his mother; Renal Disease in his father. Family History is negative migraines, seizures, cognitive impairment, blindness, deafness, birth defects, chromosomal disorder, autism.  Social History History   Social History  . Marital Status: Single    Spouse Name: N/A    Number of Children: N/A  . Years of Education: N/A   Social History Main Topics  . Smoking status: Never Smoker   . Smokeless tobacco: Never Used  . Alcohol Use: No  . Drug Use: No  . Sexual Activity: No   Other Topics Concern  .  None   Social History Narrative  . None   Educational level special education Occupation: None Living with both parents  Hobbies/Interest: Watching movies School comments Sergio [name] graduated from [employer] in 2006, he now attends [employer] day program on Mondays, Wednesdays and Fridays.  Current Outpatient Prescriptions on File Prior to Visit  Medication Sig Dispense Refill  . benzonatate (TESSALON) 200 MG capsule TAKE 1 CAPSULE THREE TIMES DAILY AS  NEEDED FOR COUGH  40 capsule  0  . donepezil (ARICEPT) 10 MG tablet Take 1 tablet (10 mg total) by mouth at bedtime.  31 tablet  5  . ergocalciferol (VITAMIN D2) 50000 UNITS capsule Take 1 capsule (50,000 Units total) by mouth once a week.  12 capsule  0  . esomeprazole (NEXIUM) 40 MG capsule Take 1 capsule (40 mg total) by mouth daily before breakfast.  30 capsule  5  . ipratropium-albuterol (DUONEB) 0.5-2.5 (3) MG/3ML SOLN Take 3 mLs by nebulization every 6 (six) hours as needed. Dx 493.90  360 mL  5  . Melatonin 10 MG TABS Take 1 tablet by mouth at bedtime.      . paliperidone (INVEGA) 3 MG 24 hr tablet TAKE ONE TABLET AT BEDTIME  30 tablet  2  . traZODone (DESYREL) 100 MG tablet TAKE 3 TABLETS AT BEDTIME  90 tablet  2  . levofloxacin (LEVAQUIN) 500 MG tablet Take 1 tablet (500 mg total) by mouth daily.  10 tablet  0  . ofloxacin (FLOXIN) 0.3 % otic solution Place 5 drops into the right ear daily.  10 mL  0   No current facility-administered medications on file prior to visit.   The medication list was reviewed and reconciled. All changes or newly prescribed medications were explained.  A complete medication list was provided to the patient/caregiver.  Allergies  Allergen Reactions  . Ceclor [Cefaclor] Anaphylaxis  . Penicillins Anaphylaxis    Physical Exam BP 96/66  Pulse 60  Ht 5' 0.5" (1.537 m)  Wt 222 lb 9.6 oz (100.971 kg)  BMI 42.74 kg/m2  General: alert, well developed, well nourished, obese young man in no acute distress , right-handed, sandy hair, blue eyes  Head: microcephalic, small ears, epicanthal folds, small digits with bilateral clinodactyly, Brushfield spots  Ears, Nose and Throat: Pharynx: oropharynx is pink without exudates or tonsillar hypertrophy, furrowed tongue.  Neck: supple, full range of motion, no cranial or cervical bruits  Respiratory: auscultation clear  Cardiovascular: no murmurs, pulses are normal  Musculoskeletal: no skeletal deformities or  apparent scoliosis  Skin: no rashes or neurocutaneous lesions, four finger palmar creases bilaterally   Neurologic Exam   Mental Status: alert; oriented to person, place; knowledge is below normal for age; language is normal, speech is intelligible.  There has been no deterioration in his ability to interact with me since his last visit.  Cranial Nerves: visual fields are full to double simultaneous stimuli; extraocular movements are full and conjugate; the right pupil is round, the left is irregular, both are eccentric, reactive to light; funduscopic examination shows sharp disc margins with normal vessels; symmetric facial strength; midline tongue and uvula; air conduction is greater than bone conduction bilaterally.  Motor: Normal strength, diminished tone, and mass; good fine motor movements; no pronator drift.  Sensory: intact responses to cold, vibration, proprioception and stereognosis  Coordination: good finger-to-nose, rapid repetitive alternating movements and finger apposition  Gait and Station: broad-based waddling gait and station; patient has difficulty walking on his heels, toes  and tandem with difficulty; balance is poor.  Reflexes: symmetric and diminished bilaterally; no clonus; bilateral flexor plantar responses.  Assessment 1. Trisomy 21, 758.0. 2. Dementia and conditions classified elsewhere with behavioral disturbance 294.11. 3. Obstructive hydrocephalus 331.4. 4. Obesity 278.00.    Discussion The patient has not had significant cognitive duration since I saw him.  At one point, it seemed that he had a progressive dementia present and seems that things are stable.  He is taking Aricept, but it is surprising that he seems to be cognitively stable.  Plan Continue Aricept and its current dose.  I would not make changes in the other medications at this time.  I spent 30 minutes of face-to-face time with the patient and his father more than half of it in consultation.  I  will see him in followup in six months' time sooner depending upon clinical need.  Deetta Perla MD

## 2013-04-13 ENCOUNTER — Ambulatory Visit (HOSPITAL_COMMUNITY): Payer: Self-pay | Admitting: Psychiatry

## 2013-04-14 ENCOUNTER — Ambulatory Visit (HOSPITAL_COMMUNITY): Payer: Self-pay | Admitting: Psychiatry

## 2013-04-27 ENCOUNTER — Ambulatory Visit: Payer: Medicare Other | Admitting: Family Medicine

## 2013-05-03 ENCOUNTER — Telehealth: Payer: Self-pay | Admitting: Family Medicine

## 2013-05-03 NOTE — Telephone Encounter (Signed)
Spoke with father   resch appt they missed

## 2013-05-11 ENCOUNTER — Encounter: Payer: Self-pay | Admitting: Family Medicine

## 2013-05-11 ENCOUNTER — Ambulatory Visit (INDEPENDENT_AMBULATORY_CARE_PROVIDER_SITE_OTHER): Payer: Medicare Other | Admitting: Family Medicine

## 2013-05-11 DIAGNOSIS — B353 Tinea pedis: Secondary | ICD-10-CM | POA: Insufficient documentation

## 2013-05-11 DIAGNOSIS — G911 Obstructive hydrocephalus: Secondary | ICD-10-CM | POA: Diagnosis not present

## 2013-05-11 DIAGNOSIS — F0390 Unspecified dementia without behavioral disturbance: Secondary | ICD-10-CM

## 2013-05-11 DIAGNOSIS — G4730 Sleep apnea, unspecified: Secondary | ICD-10-CM | POA: Diagnosis not present

## 2013-05-11 DIAGNOSIS — F0281 Dementia in other diseases classified elsewhere with behavioral disturbance: Secondary | ICD-10-CM | POA: Diagnosis not present

## 2013-05-11 DIAGNOSIS — Z23 Encounter for immunization: Secondary | ICD-10-CM | POA: Insufficient documentation

## 2013-05-11 DIAGNOSIS — F259 Schizoaffective disorder, unspecified: Secondary | ICD-10-CM

## 2013-05-11 DIAGNOSIS — E559 Vitamin D deficiency, unspecified: Secondary | ICD-10-CM

## 2013-05-11 DIAGNOSIS — G4733 Obstructive sleep apnea (adult) (pediatric): Secondary | ICD-10-CM | POA: Diagnosis not present

## 2013-05-11 DIAGNOSIS — G4710 Hypersomnia, unspecified: Secondary | ICD-10-CM | POA: Diagnosis not present

## 2013-05-11 DIAGNOSIS — G919 Hydrocephalus, unspecified: Secondary | ICD-10-CM

## 2013-05-11 DIAGNOSIS — F02818 Dementia in other diseases classified elsewhere, unspecified severity, with other behavioral disturbance: Secondary | ICD-10-CM

## 2013-05-11 DIAGNOSIS — K219 Gastro-esophageal reflux disease without esophagitis: Secondary | ICD-10-CM

## 2013-05-11 DIAGNOSIS — H60399 Other infective otitis externa, unspecified ear: Secondary | ICD-10-CM

## 2013-05-11 DIAGNOSIS — H6091 Unspecified otitis externa, right ear: Secondary | ICD-10-CM | POA: Insufficient documentation

## 2013-05-11 DIAGNOSIS — J45909 Unspecified asthma, uncomplicated: Secondary | ICD-10-CM

## 2013-05-11 MED ORDER — KETOCONAZOLE 2 % EX CREA: 1.0000 "application " | TOPICAL_CREAM | Freq: Two times a day (BID) | CUTANEOUS | Status: DC

## 2013-05-11 NOTE — Progress Notes (Signed)
Patient ID: VENCIL LIBERTI III, male   DOB: 11-04-1985, 28 y.o.   MRN: [id number] SUBJECTIVE: CC: Chief Complaint  Patient presents with  . Follow-up    3 month follow up     HPI:  According to mom patient is doing great!! The neb treatments bid has for the first time prevented a bronchospastic episode during winter.  Has been on the vitamin D. Here to recheck. Has seen Dr Gaynell Face. See note in EPIC.  New problems are: Right ear drainage Athlete foot both feet. Rash inbetween toes.  Other problems related to Down's syndrome and Hydrocephalus  Stable.    Past Medical History  Diagnosis Date  . Down's syndrome   . GERD (gastroesophageal reflux disease)   . Hydrocephalus   . Schizoaffective disorder   . Dementia   . Memory loss   . Headache(784.0)   . Difficulty swallowing   . Weakness   . Insomnia   . Dysphagia causing pulmonary aspiration with swallowing   . OSA (obstructive sleep apnea)   . Pituitary adenoma    Past Surgical History  Procedure Laterality Date  . Csf shunt    . Brain surgery    . Ventriculoperitoneal shunt    . Tympanostomy tube placement Bilateral    History   Social History  . Marital Status: Single    Spouse Name: N/A    Number of Children: N/A  . Years of Education: N/A   Occupational History  . Not on file.   Social History Main Topics  . Smoking status: Never Smoker   . Smokeless tobacco: Never Used  . Alcohol Use: No  . Drug Use: No  . Sexual Activity: No   Other Topics Concern  . Not on file   Social History Narrative  . No narrative on file   Family History  Problem Relation Age of Onset  . Hypertension Mother   . Renal Disease Father   . Emphysema Maternal Grandfather     Died at 1   Current Outpatient Prescriptions on File Prior to Visit  Medication Sig Dispense Refill  . benzonatate (TESSALON) 200 MG capsule TAKE 1 CAPSULE THREE TIMES DAILY AS NEEDED FOR COUGH  40 capsule  0  . donepezil (ARICEPT) 10 MG  tablet Take one tablet by mouth at bedtime  31 tablet  5  . esomeprazole (NEXIUM) 40 MG capsule Take 1 capsule (40 mg total) by mouth daily before breakfast.  30 capsule  5  . ipratropium-albuterol (DUONEB) 0.5-2.5 (3) MG/3ML SOLN Take 3 mLs by nebulization every 6 (six) hours as needed. Dx 493.90  360 mL  5  . levofloxacin (LEVAQUIN) 500 MG tablet Take 1 tablet (500 mg total) by mouth daily.  10 tablet  0  . Melatonin 10 MG TABS Take 1 tablet by mouth at bedtime.      Marland Kitchen ofloxacin (FLOXIN) 0.3 % otic solution Place 5 drops into the right ear daily.  10 mL  0  . paliperidone (INVEGA) 3 MG 24 hr tablet TAKE ONE TABLET AT BEDTIME  30 tablet  2  . traZODone (DESYREL) 100 MG tablet TAKE 3 TABLETS AT BEDTIME  90 tablet  2   No current facility-administered medications on file prior to visit.   Allergies  Allergen Reactions  . Ceclor [Cefaclor] Anaphylaxis  . Penicillins Anaphylaxis   Immunization History  Administered Date(s) Administered  . Influenza,inj,Quad PF,36+ Mos 05/11/2013   Prior to Admission medications   Medication Sig Start Date End Date  Taking? Authorizing Provider  benzonatate (TESSALON) 200 MG capsule TAKE 1 CAPSULE THREE TIMES DAILY AS NEEDED FOR COUGH 02/04/13  Yes Vernie Shanks, MD  donepezil (ARICEPT) 10 MG tablet Take one tablet by mouth at bedtime 04/05/13  Yes Jodi Geralds, MD  ergocalciferol (VITAMIN D2) 50000 UNITS capsule Take 1 capsule (50,000 Units total) by mouth once a week. 01/23/13  Yes Vernie Shanks, MD  esomeprazole (NEXIUM) 40 MG capsule Take 1 capsule (40 mg total) by mouth daily before breakfast. 03/09/13  Yes Lysbeth Penner, FNP  ipratropium-albuterol (DUONEB) 0.5-2.5 (3) MG/3ML SOLN Take 3 mLs by nebulization every 6 (six) hours as needed. Dx 493.90 01/21/13  Yes Vernie Shanks, MD  levofloxacin (LEVAQUIN) 500 MG tablet Take 1 tablet (500 mg total) by mouth daily. 02/26/13  Yes Lysbeth Penner, FNP  Melatonin 10 MG TABS Take 1 tablet by mouth at  bedtime.   Yes Historical Provider, MD  ofloxacin (FLOXIN) 0.3 % otic solution Place 5 drops into the right ear daily. 01/21/13  Yes Vernie Shanks, MD  paliperidone (INVEGA) 3 MG 24 hr tablet TAKE ONE TABLET AT BEDTIME 02/09/13  Yes Hampton Abbot, MD  traZODone (DESYREL) 100 MG tablet TAKE 3 TABLETS AT BEDTIME 02/09/13  Yes Hampton Abbot, MD     ROS: As above in the HPI. All other systems are stable or negative.  OBJECTIVE: APPEARANCE:  Patient in no acute distress.The patient appeared well nourished and normally developed. Acyanotic. Waist: VITAL SIGNS:BP 112/54  Pulse 56  Temp(Src) 98.2 F (36.8 C) (Oral)  Ht 5\' 1"  (1.549 m)  Wt 225 lb 6.4 oz (102.241 kg)  BMI 42.61 kg/m2 Obese WM with features of Down's  Syndrome. Stable looks well today  SKIN: warm and  Dry without, tattoos and scars Rash between toes and moist macerated skin in the web spaces. He has hyperhidrosis and his socks are wet.   HEAD and Neck: without JVD, Head and scalp: normal Eyes:No scleral icterus. Fundi normal, eye movements normal. Ears: Auricle normal, canal normal on th eleft On th eright there is white purulent discharge and cruddy looking material in the canal. , Tympanic membranes norma except on the right where I could not visualize the right TM., insufflation abnormal.on the right  Nose: normal Throat: normal Neck & thyroid: normal  CHEST & LUNGS: Chest wall: normal Lungs: Clear  CVS: Reveals the PMI to be normally located. Regular rhythm, First and Second Heart sounds are normal,  absence of murmurs, rubs or gallops. Peripheral vasculature: Radial pulses: normal Dorsal pedis pulses: normal Posterior pulses: normal  ABDOMEN:  Appearance: Obese Benign, no organomegaly, no masses, no Abdominal Aortic enlargement. No Guarding , no rebound. No Bruits. Bowel sounds: normal  RECTAL: N/A GU: N/A  EXTREMETIES: nonedematous.  NEUROLOGIC: no changes   ASSESSMENT: Severe obesity (BMI  >= 40)  Dementia in conditions classified elsewhere with behavioral disturbance(294.11)  Dementia  Hydrocephalus  Need for prophylactic vaccination and inoculation against influenza  Hypersomnia with sleep apnea, unspecified  OSA (obstructive sleep apnea)  Schizoaffective disorder  Unspecified asthma(493.90)  Unspecified vitamin D deficiency - Plan: Vit D  25 hydroxy (rtn osteoporosis monitoring)  GERD (gastroesophageal reflux disease)  Right otitis externa  PLAN:  Orders Placed This Encounter  Procedures  . Vit D  25 hydroxy (rtn osteoporosis monitoring)   Meds ordered this encounter  Medications  . cholecalciferol (VITAMIN D) 1000 UNITS tablet    Sig: Take 4,000 Units by mouth daily.  Marland Kitchen  ketoconazole (NIZORAL) 2 % cream    Sig: Apply 1 application topically 2 (two) times daily. To the feet    Dispense:  60 g    Refill:  2   Medications Discontinued During This Encounter  Medication Reason  . ergocalciferol (VITAMIN D2) 50000 UNITS capsule Change in therapy  Frequent change in socks. Keep feet dry. Spray all shoes with antifungal spray. Rotate a different shoe daily.  Return in about 3 months (around 08/09/2013) for Recheck medical problems.  Worley Radermacher P. Jacelyn Grip, M.D.

## 2013-05-12 LAB — VITAMIN D 25 HYDROXY (VIT D DEFICIENCY, FRACTURES): Vit D, 25-Hydroxy: 21.9 ng/mL — ABNORMAL LOW (ref 30.0–100.0)

## 2013-05-12 NOTE — Progress Notes (Signed)
Quick Note:  Call Patient Labs that are abnormal: Vit D still low  Recommendations: Increase the Vit D to 6000 Units daily. Will recheck in 6 to 8 weeks at his follow up.   ______

## 2013-06-10 ENCOUNTER — Ambulatory Visit (HOSPITAL_COMMUNITY): Payer: Self-pay | Admitting: Psychiatry

## 2013-06-15 ENCOUNTER — Ambulatory Visit (INDEPENDENT_AMBULATORY_CARE_PROVIDER_SITE_OTHER): Payer: Medicare Other | Admitting: Psychiatry

## 2013-06-15 ENCOUNTER — Encounter (HOSPITAL_COMMUNITY): Payer: Self-pay | Admitting: Psychiatry

## 2013-06-15 VITALS — BP 120/82 | Ht 61.0 in | Wt 225.6 lb

## 2013-06-15 DIAGNOSIS — F209 Schizophrenia, unspecified: Secondary | ICD-10-CM

## 2013-06-15 DIAGNOSIS — F259 Schizoaffective disorder, unspecified: Secondary | ICD-10-CM

## 2013-06-15 DIAGNOSIS — F0390 Unspecified dementia without behavioral disturbance: Secondary | ICD-10-CM

## 2013-06-15 MED ORDER — PALIPERIDONE ER 3 MG PO TB24: ORAL_TABLET | ORAL | Status: DC

## 2013-06-15 MED ORDER — TRAZODONE HCL 100 MG PO TABS: ORAL_TABLET | ORAL | Status: DC

## 2013-06-15 MED ORDER — HYDROXYZINE PAMOATE 100 MG PO CAPS: 100.0000 mg | ORAL_CAPSULE | Freq: Every evening | ORAL | Status: DC | PRN

## 2013-06-22 NOTE — Progress Notes (Signed)
Patient ID: [name], male   DOB: 1985/06/17, 28 y.o.   MRN: [id number]  [place] [id number] Progress Note  [name] [id number] 28 y.o.  Date of visit 06/15/2013 Chief Complaint: I am doing OK but I'm tired this morning as I slept late  last night  History of Present Illness: Patient is a 28 year old diagnosed with schizoaffective disorder, Down syndrome, GERD and hydrocephalus who presents today for a followup visit.  Mom reports that the patient's doing fairly okay , adds that he still attending the [facility]. She states that he does not have any tumor and still has the shunt. She adds that the shunt is functional and patient does not need any surgery  In regards to his daily activities, mom reports that patient still attends the adult center 3-4 times a week and continues to have [employer].  In regards to this hallucinations, mom reports that he does still complains about the " weirdo's" does but overall seems to be doing fairly well. She states that he continues to remain stable.   She denies any side effects of the medications, any other complaints, any safety issues at this visit   Suicidal Ideation: No Plan Formed: No Patient has means to carry out plan: No  Homicidal Ideation: No Plan Formed: No Patient has means to carry out plan: No  Review of Systems  Constitutional: Negative.  Negative for fever, weight loss and malaise/fatigue.  HENT: Negative.  Negative for congestion and sore throat.   Eyes: Negative.  Negative for blurred vision.  Respiratory: Negative.  Negative for cough, shortness of breath and wheezing.   Cardiovascular: Negative.  Negative for chest pain and palpitations.  Gastrointestinal: Negative.  Negative for heartburn, nausea and vomiting.  Musculoskeletal: Negative.  Negative for falls, joint pain and myalgias.  Skin: Negative.  Negative for itching and rash.  Neurological: Negative.  Negative for dizziness, sensory  change, speech change, focal weakness, seizures, loss of consciousness, weakness and headaches.  Endo/Heme/Allergies: Negative.  Negative for environmental allergies.  Psychiatric/Behavioral: Positive for hallucinations. Negative for depression, suicidal ideas, memory loss and substance abuse. The patient has insomnia. The patient is not nervous/anxious.     Past Medical Family, Social History:  Lives with parents in [name], patient attends the adult daycare center 2-3 times a week Past Medical History  Diagnosis Date  . Down's syndrome   . GERD (gastroesophageal reflux disease)   . Hydrocephalus   . Schizoaffective disorder   . Dementia   . Memory loss   . Headache(784.0)   . Difficulty swallowing   . Weakness   . Insomnia   . Dysphagia causing pulmonary aspiration with swallowing   . OSA (obstructive sleep apnea)   . Pituitary adenoma    Family History  Problem Relation Age of Onset  . Hypertension Mother   . Renal Disease Father   . Emphysema Maternal Grandfather     Died at 65    Outpatient Encounter Prescriptions as of 06/15/2013  Medication Sig  . cholecalciferol (VITAMIN D) 1000 UNITS tablet Take 6,000 Units by mouth daily.   Marland Kitchen donepezil (ARICEPT) 10 MG tablet Take one tablet by mouth at bedtime  . esomeprazole (NEXIUM) 40 MG capsule Take 1 capsule (40 mg total) by mouth daily before breakfast.  . ipratropium-albuterol (DUONEB) 0.5-2.5 (3) MG/3ML SOLN Take 3 mLs by nebulization every 6 (six) hours as needed. Dx 493.90  . Melatonin 10 MG TABS Take 1 tablet by mouth  at bedtime.  Marland Kitchen ofloxacin (FLOXIN) 0.3 % otic solution Place 5 drops into the right ear daily.  . paliperidone (INVEGA) 3 MG 24 hr tablet TAKE ONE TABLET AT BEDTIME  . traZODone (DESYREL) 100 MG tablet TAKE 3 TABLETS AT BEDTIME  . [DISCONTINUED] paliperidone (INVEGA) 3 MG 24 hr tablet TAKE ONE TABLET AT BEDTIME  . [DISCONTINUED] traZODone (DESYREL) 100 MG tablet TAKE 3 TABLETS AT BEDTIME  .  benzonatate (TESSALON) 200 MG capsule TAKE 1 CAPSULE THREE TIMES DAILY AS NEEDED FOR COUGH  . hydrOXYzine (VISTARIL) 100 MG capsule Take 1 capsule (100 mg total) by mouth at bedtime as needed (sleep).  Marland Kitchen levofloxacin (LEVAQUIN) 500 MG tablet Take 1 tablet (500 mg total) by mouth daily.    Past Psychiatric History/Hospitalization(s): Anxiety: No Bipolar Disorder: No Depression: No Mania: No Psychosis: Yes Schizophrenia: Yes Personality Disorder: No Hospitalization for psychiatric illness: No History of Electroconvulsive Shock Therapy: No Prior Suicide Attempts: No  Physical Exam: Constitutional:  BP 120/82  Ht 5\' 1"  (1.549 m)  Wt 225 lb 9.6 oz (102.331 kg)  BMI 42.65 kg/m2  General Appearance: alert, oriented, no acute distress and obese  Musculoskeletal: Strength & Muscle Tone: within normal limits Gait & Station: broad based Patient leans: N/A  Psychiatric: Speech (describe rate, volume, coherence, spontaneity, and abnormalities if any):  Slow in rate but normal in volume and spontaneous  Thought Process (describe rate, content, abstract reasoning, and computation):  concrete  Associations: Relevant  Thoughts: delusions and hallucinations  Mental Status: Orientation: oriented to person and place Mood & Affect: normal affect and appears tired today Attention Span & Concentration:  OK Cognition patient is intellectually limited Recent and remote memories are appropriate for patient's intellectual ability Insight and judgment seems to fluctuate between fair to poor as patient is intellectually limited Fund of knowledge and language: Poor  Medical Decision Making (Choose Three): Established Problem, Stable/Improving (1), Review of Psycho-Social Stressors (1), Review of Last Therapy Session (1) and Review of Medication Regimen & Side Effects (2)  Assessment: Axis I: schizo affective disorder, dementia  Axis II:  deferred  Axis [name]:  Down syndrome, GERD, status post  pneumonia, hydrocephalus  Axis IV:  moderate  Axis V: 50   Plan:  Schizophrenia : Continue Invega 3 mg but change to one at night for psychosis Insomnia: Continue trazodone 300 mg at bedtime for sleep. Continue melatonin 10 mg at bedtime for sleep To start Vistaril 100 mg at bedtime to help with sleep. The risks and benefits along with the side effects were discussed with mom and she was agreeable with this plan GERD: Continue diet modification along with Prilosec to help with patient's GERD Obstructive sleep apnea: Use CPAP daily at night Dementia: Continue Aricept 10 mg daily for dementia Aspiration pneumonia: Continue to take the medications with yogurt to prevent aspiration  Continue to attend adult day center Call when necessary Followup in 2 months This followup visit was of moderate medical complexity, summation of records from the neurologist, review of data, detailed examination along with a plan was discussed at this visit Hampton Abbot, MD

## 2013-06-29 ENCOUNTER — Ambulatory Visit: Payer: Medicare Other | Admitting: Family Medicine

## 2013-08-10 ENCOUNTER — Ambulatory Visit (INDEPENDENT_AMBULATORY_CARE_PROVIDER_SITE_OTHER): Payer: Medicare Other | Admitting: Family Medicine

## 2013-08-10 ENCOUNTER — Encounter: Payer: Self-pay | Admitting: Family Medicine

## 2013-08-10 VITALS — BP 118/74 | HR 69 | Temp 98.6°F | Ht 61.0 in | Wt 228.4 lb

## 2013-08-10 DIAGNOSIS — G919 Hydrocephalus, unspecified: Secondary | ICD-10-CM

## 2013-08-10 DIAGNOSIS — F0281 Dementia in other diseases classified elsewhere with behavioral disturbance: Secondary | ICD-10-CM

## 2013-08-10 DIAGNOSIS — G4710 Hypersomnia, unspecified: Secondary | ICD-10-CM

## 2013-08-10 DIAGNOSIS — K219 Gastro-esophageal reflux disease without esophagitis: Secondary | ICD-10-CM

## 2013-08-10 DIAGNOSIS — G4733 Obstructive sleep apnea (adult) (pediatric): Secondary | ICD-10-CM | POA: Diagnosis not present

## 2013-08-10 DIAGNOSIS — G4730 Sleep apnea, unspecified: Secondary | ICD-10-CM

## 2013-08-10 DIAGNOSIS — Q909 Down syndrome, unspecified: Secondary | ICD-10-CM

## 2013-08-10 DIAGNOSIS — F0390 Unspecified dementia without behavioral disturbance: Secondary | ICD-10-CM

## 2013-08-10 DIAGNOSIS — D4989 Neoplasm of unspecified behavior of other specified sites: Secondary | ICD-10-CM

## 2013-08-10 DIAGNOSIS — D352 Benign neoplasm of pituitary gland: Secondary | ICD-10-CM

## 2013-08-10 DIAGNOSIS — F259 Schizoaffective disorder, unspecified: Secondary | ICD-10-CM

## 2013-08-10 DIAGNOSIS — F02818 Dementia in other diseases classified elsewhere, unspecified severity, with other behavioral disturbance: Secondary | ICD-10-CM

## 2013-08-10 DIAGNOSIS — D353 Benign neoplasm of craniopharyngeal duct: Secondary | ICD-10-CM

## 2013-08-10 DIAGNOSIS — J45909 Unspecified asthma, uncomplicated: Secondary | ICD-10-CM | POA: Diagnosis not present

## 2013-08-10 DIAGNOSIS — E559 Vitamin D deficiency, unspecified: Secondary | ICD-10-CM | POA: Diagnosis not present

## 2013-08-10 DIAGNOSIS — G911 Obstructive hydrocephalus: Secondary | ICD-10-CM

## 2013-08-10 NOTE — Progress Notes (Signed)
Patient ID: S[name], male   DOB: 03/18/1986, 28 y.o.   MRN: [id number] SUBJECTIVE: CC: Chief Complaint  Patient presents with  . Follow-up    3 month follow up  c/o  bottoms of feet cracked  rt side of nose enlarged  . Medication Refill    refill on all meds wants      HPI: Here for routine follow up of his co-morbid disorders: He has Down's  Syndrome with a h/o hydrocephalus , schizoaffective disorder, dementia, memory loss, insomnia, OSA, pituitary adenoma, GERD, obesity. His weight has gone up. He does eat health meals but a lot of it but not enough vegetables. His knees are limiting his exercise / walking. He lacks the coordination for more demanding sports or exercise programs. He has dried cracked skin on the soles of the feet. He has a recurrence of cyst lesion in the left nares.this was debrided/or excised some time ago by a Paediatric nurse in [place]. There is a new one coming up on the outside of the nose on the right side close to the nose bridge.   Past Medical History  Diagnosis Date  . Down's syndrome   . GERD (gastroesophageal reflux disease)   . Hydrocephalus   . Schizoaffective disorder   . Dementia   . Memory loss   . Headache(784.0)   . Difficulty swallowing   . Weakness   . Insomnia   . Dysphagia causing pulmonary aspiration with swallowing   . OSA (obstructive sleep apnea)   . Pituitary adenoma    Past Surgical History  Procedure Laterality Date  . Csf shunt    . Brain surgery    . Ventriculoperitoneal shunt    . Tympanostomy tube placement Bilateral    History   Social History  . Marital Status: Single    Spouse Name: N/A    Number of Children: N/A  . Years of Education: N/A   Occupational History  . Not on file.   Social History Main Topics  . Smoking status: Never Smoker   . Smokeless tobacco: Never Used  . Alcohol Use: No  . Drug Use: No  . Sexual Activity: No   Other Topics Concern  . Not on file   Social History  Narrative  . No narrative on file   Family History  Problem Relation Age of Onset  . Hypertension Mother   . Renal Disease Father   . Emphysema Maternal Grandfather     Died at 61   Current Outpatient Prescriptions on File Prior to Visit  Medication Sig Dispense Refill  . cholecalciferol (VITAMIN D) 1000 UNITS tablet Take 6,000 Units by mouth daily.       Marland Kitchen donepezil (ARICEPT) 10 MG tablet Take one tablet by mouth at bedtime  31 tablet  5  . esomeprazole (NEXIUM) 40 MG capsule Take 1 capsule (40 mg total) by mouth daily before breakfast.  30 capsule  5  . hydrOXYzine (VISTARIL) 100 MG capsule Take 1 capsule (100 mg total) by mouth at bedtime as needed (sleep).  30 capsule  2  . ipratropium-albuterol (DUONEB) 0.5-2.5 (3) MG/3ML SOLN Take 3 mLs by nebulization every 6 (six) hours as needed. Dx 493.90  360 mL  5  . Melatonin 10 MG TABS Take 1 tablet by mouth at bedtime.      Marland Kitchen ofloxacin (FLOXIN) 0.3 % otic solution Place 5 drops into the right ear daily.  10 mL  0  . paliperidone (INVEGA) 3 MG 24  hr tablet TAKE ONE TABLET AT BEDTIME  30 tablet  2  . traZODone (DESYREL) 100 MG tablet TAKE 3 TABLETS AT BEDTIME  90 tablet  2  . benzonatate (TESSALON) 200 MG capsule TAKE 1 CAPSULE THREE TIMES DAILY AS NEEDED FOR COUGH  40 capsule  0  . levofloxacin (LEVAQUIN) 500 MG tablet Take 1 tablet (500 mg total) by mouth daily.  10 tablet  0   No current facility-administered medications on file prior to visit.   Allergies  Allergen Reactions  . Ceclor [Cefaclor] Anaphylaxis  . Penicillins Anaphylaxis   Immunization History  Administered Date(s) Administered  . Influenza,inj,Quad PF,36+ Mos 05/11/2013   Prior to Admission medications   Medication Sig Start Date End Date Taking? Authorizing Provider  cholecalciferol (VITAMIN D) 1000 UNITS tablet Take 6,000 Units by mouth daily.    Yes Historical Provider, MD  donepezil (ARICEPT) 10 MG tablet Take one tablet by mouth at bedtime 04/05/13  Yes Jodi Geralds, MD  esomeprazole (NEXIUM) 40 MG capsule Take 1 capsule (40 mg total) by mouth daily before breakfast. 03/09/13  Yes Lysbeth Penner, FNP  hydrOXYzine (VISTARIL) 100 MG capsule Take 1 capsule (100 mg total) by mouth at bedtime as needed (sleep). 06/15/13  Yes Hampton Abbot, MD  ipratropium-albuterol (DUONEB) 0.5-2.5 (3) MG/3ML SOLN Take 3 mLs by nebulization every 6 (six) hours as needed. Dx 493.90 01/21/13  Yes Vernie Shanks, MD  Melatonin 10 MG TABS Take 1 tablet by mouth at bedtime.   Yes Historical Provider, MD  ofloxacin (FLOXIN) 0.3 % otic solution Place 5 drops into the right ear daily. 01/21/13  Yes Vernie Shanks, MD  paliperidone (INVEGA) 3 MG 24 hr tablet TAKE ONE TABLET AT BEDTIME 06/15/13  Yes Hampton Abbot, MD  traZODone (DESYREL) 100 MG tablet TAKE 3 TABLETS AT BEDTIME 06/15/13  Yes Hampton Abbot, MD  benzonatate (TESSALON) 200 MG capsule TAKE 1 CAPSULE THREE TIMES DAILY AS NEEDED FOR COUGH 02/04/13   Vernie Shanks, MD  levofloxacin (LEVAQUIN) 500 MG tablet Take 1 tablet (500 mg total) by mouth daily. 02/26/13   Lysbeth Penner, FNP     ROS: As above in the HPI. All other systems are stable or negative.  OBJECTIVE: APPEARANCE:  Patient in no acute distress.The patient appeared well nourished and normally developed. Acyanotic. Waist: VITAL SIGNS:BP 118/74  Pulse 69  Temp(Src) 98.6 F (37 C) (Oral)  Ht 5\' 1"  (1.549 m)  Wt 228 lb 6.4 oz (103.602 kg)  BMI 43.18 kg/m2 OBese [name] with the obvious features of Down's  Syndrome.  SKIN: warm and  Dry without overt rashes, tattoos and scars. The skin is calloused and cracked on the ball of both feet. The tinea pedis has resolved from the last visit.   HEAD and Neck: without JVD, Head and scalp: normal Eyes:No scleral icterus. Fundi normal, eye movements normal. Ears: Auricle normal, canal normal, Tympanic membranes normal, insufflation normal. Nose: cyst on the medial wall of the left nares.. Soft cyst like mass on  the right side of the nose. Throat: normal Neck & thyroid: normal  CHEST & LUNGS: Chest wall: normal Lungs: Clear  CVS: Reveals the PMI to be normally located. Regular rhythm, First and Second Heart sounds are normal,  absence of murmurs, rubs or gallops. Peripheral vasculature: Radial pulses: normal Dorsal pedis pulses: normal Posterior pulses: normal  ABDOMEN:  Appearance:obese Benign, no organomegaly, no masses, no Abdominal Aortic enlargement. No Guarding , no rebound. No Bruits. Bowel  sounds: normal  RECTAL: N/A GU: N/A  EXTREMETIES: nonedematous.  MUSCULOSKELETAL:  Spine: normal Joints: lax patella bilaterally.  NEUROLOGIC: oriented to  Person;no changes.  Results for orders placed in visit on 05/11/13  VITAMIN D 25 HYDROXY      Result Value Ref Range   Vit D, 25-Hydroxy 21.9 (*) 30.0 - 100.0 ng/mL    ASSESSMENT:  Unspecified vitamin D deficiency - Plan: Vit D  25 hydroxy (rtn osteoporosis monitoring)  Unspecified asthma(493.90)  Severe obesity (BMI >= 40)  Schizoaffective disorder  OSA (obstructive sleep apnea)  Hypersomnia with sleep apnea, unspecified  Hydrocephalus  Dementia  Dementia in conditions classified elsewhere with behavioral disturbance  GERD (gastroesophageal reflux disease)  Down's syndrome  Obstructive hydrocephalus  Pituitary adenoma  Neoplasm, nose - Plan: Ambulatory referral to ENT  PLAN: Reviewed his diet with patient and his mother Reviewed physical activities. Made suggestions to try to improve diet and physical activities.  Orders Placed This Encounter  Procedures  . Vit D  25 hydroxy (rtn osteoporosis monitoring)  . Ambulatory referral to ENT    Referral Priority:  Routine    Referral Type:  Consultation    Referral Reason:  Specialty Services Required    Requested Specialty:  Otolaryngology    Number of Visits Requested:  1  Rubber band resistance exercise for toning. Weights as tolerated. Walking as  best as you can. Cetaphil cream to feet. Use Burt's bees foot creme at nights. Increase vegetables 3 a day then work up to 5 servings a day.  No orders of the defined types were placed in this encounter.   There are no discontinued medications. Return in about 3 months (around 11/09/2013) for Recheck medical problems.  Rindi Beechy P. Jacelyn Grip, M.D.

## 2013-08-10 NOTE — Patient Instructions (Addendum)
Rubber band resistance exercise for toning. Weights as tolerated. Walking as best as you can. Cetaphil cream to feet. Use Burt's bees foot creme at nights. Increase vegetables 3 a day then work up to 5 servings a day.

## 2013-08-11 LAB — VITAMIN D 25 HYDROXY (VIT D DEFICIENCY, FRACTURES): Vit D, 25-Hydroxy: 40.8 ng/mL (ref 30.0–100.0)

## 2013-08-13 ENCOUNTER — Ambulatory Visit (INDEPENDENT_AMBULATORY_CARE_PROVIDER_SITE_OTHER): Payer: Medicare Other | Admitting: Family Medicine

## 2013-08-13 ENCOUNTER — Telehealth: Payer: Self-pay | Admitting: Family Medicine

## 2013-08-13 ENCOUNTER — Ambulatory Visit (INDEPENDENT_AMBULATORY_CARE_PROVIDER_SITE_OTHER): Payer: Medicare Other

## 2013-08-13 ENCOUNTER — Encounter: Payer: Self-pay | Admitting: Family Medicine

## 2013-08-13 VITALS — BP 122/78 | HR 118 | Temp 101.5°F | Ht 61.0 in | Wt 224.8 lb

## 2013-08-13 DIAGNOSIS — R059 Cough, unspecified: Secondary | ICD-10-CM

## 2013-08-13 DIAGNOSIS — R05 Cough: Secondary | ICD-10-CM

## 2013-08-13 DIAGNOSIS — R0602 Shortness of breath: Secondary | ICD-10-CM

## 2013-08-13 DIAGNOSIS — J209 Acute bronchitis, unspecified: Secondary | ICD-10-CM

## 2013-08-13 LAB — POCT CBC
Granulocyte percent: 82.6 %G — AB (ref 37–80)
HCT, POC: 42.9 % — AB (ref 43.5–53.7)
Hemoglobin: 14 g/dL — AB (ref 14.1–18.1)
Lymph, poc: 2.5 (ref 0.6–3.4)
MCH, POC: 30.1 pg (ref 27–31.2)
MCHC: 32.7 g/dL (ref 31.8–35.4)
MCV: 91.9 fL (ref 80–97)
MPV: 7.7 fL (ref 0–99.8)
POC Granulocyte: 13.2 — AB (ref 2–6.9)
POC LYMPH PERCENT: 15.6 %L (ref 10–50)
Platelet Count, POC: 239 10*3/uL (ref 142–424)
RBC: 4.7 M/uL (ref 4.69–6.13)
RDW, POC: 13.6 %
WBC: 16 10*3/uL — AB (ref 4.6–10.2)

## 2013-08-13 MED ORDER — POLYMYXIN B-TRIMETHOPRIM 10000-0.1 UNIT/ML-% OP SOLN: 2.0000 [drp] | OPHTHALMIC | Status: DC

## 2013-08-13 MED ORDER — LEVOFLOXACIN 500 MG PO TABS: 500.0000 mg | ORAL_TABLET | Freq: Every day | ORAL | Status: DC

## 2013-08-13 MED ORDER — METHYLPREDNISOLONE ACETATE 80 MG/ML IJ SUSP
80.0000 mg | Freq: Once | INTRAMUSCULAR | Status: AC
  Administered 2013-08-13: 80 mg via INTRAMUSCULAR

## 2013-08-13 NOTE — Progress Notes (Signed)
   Subjective:    Patient ID: [name], male    DOB: [birth year], 28 y.o.   MRN: [id number]  HPI This 28 y.o. male presents for evaluation of cough and uri sx's. He is having problems with red eye and conjunctivitis.  Review of Systems No chest pain, SOB, HA, dizziness, vision change, N/V, diarrhea, constipation, dysuria, urinary urgency or frequency, myalgias, arthralgias or rash.     Objective:   Physical Exam  Vital signs noted  Well developed well nourished male.  HEENT - Head atraumatic Normocephalic                Eyes - PERRLA, Conjuctiva - injected OU Sclera- Clear EOMI                Ears - EAC's Wnl TM's Wnl Gross Hearing WNL                Nose - Nares patent                 Throat - oropharanx wnl Respiratory - Lungs with expiratory wheezes Cardiac - RRR S1 and S2 without murmur GI - Abdomen soft Nontender and bowel sounds active x 4 Extremities - No edema. Neuro - Grossly intact.      Assessment & Plan:  SOB (shortness of breath) - Plan: POCT CBC, DG Chest 2 View, levofloxacin (LEVAQUIN) 500 MG tablet, methylPREDNISolone acetate (DEPO-MEDROL) injection 80 mg, trimethoprim-polymyxin b (POLYTRIM) ophthalmic solution  Cough - Plan: POCT CBC, DG Chest 2 View, levofloxacin (LEVAQUIN) 500 MG tablet, methylPREDNISolone acetate (DEPO-MEDROL) injection 80 mg, trimethoprim-polymyxin b (POLYTRIM) ophthalmic solution  Acute bronchitis - Plan: levofloxacin (LEVAQUIN) 500 MG tablet, methylPREDNISolone acetate (DEPO-MEDROL) injection 80 mg, trimethoprim-polymyxin b (POLYTRIM) ophthalmic solution

## 2013-08-13 NOTE — Telephone Encounter (Signed)
appt scheduled for 3:00 pm today with Dietrich Pates, FNP. Mother aware.

## 2013-08-18 DIAGNOSIS — G4733 Obstructive sleep apnea (adult) (pediatric): Secondary | ICD-10-CM | POA: Diagnosis not present

## 2013-08-18 DIAGNOSIS — R221 Localized swelling, mass and lump, neck: Secondary | ICD-10-CM | POA: Diagnosis not present

## 2013-08-18 DIAGNOSIS — R220 Localized swelling, mass and lump, head: Secondary | ICD-10-CM | POA: Diagnosis not present

## 2013-08-18 DIAGNOSIS — J301 Allergic rhinitis due to pollen: Secondary | ICD-10-CM | POA: Diagnosis not present

## 2013-09-10 ENCOUNTER — Other Ambulatory Visit: Payer: Self-pay | Admitting: Family Medicine

## 2013-09-10 ENCOUNTER — Telehealth (HOSPITAL_COMMUNITY): Payer: Self-pay

## 2013-09-10 ENCOUNTER — Other Ambulatory Visit (HOSPITAL_COMMUNITY): Payer: Self-pay | Admitting: Psychiatry

## 2013-09-13 ENCOUNTER — Telehealth (HOSPITAL_COMMUNITY): Payer: Self-pay | Admitting: *Deleted

## 2013-09-13 ENCOUNTER — Ambulatory Visit (INDEPENDENT_AMBULATORY_CARE_PROVIDER_SITE_OTHER): Payer: Medicare Other | Admitting: Family Medicine

## 2013-09-13 VITALS — BP 127/78 | HR 72 | Temp 99.4°F | Ht 61.0 in | Wt 221.4 lb

## 2013-09-13 DIAGNOSIS — H6690 Otitis media, unspecified, unspecified ear: Secondary | ICD-10-CM

## 2013-09-13 DIAGNOSIS — H6692 Otitis media, unspecified, left ear: Secondary | ICD-10-CM

## 2013-09-13 MED ORDER — LEVOFLOXACIN 500 MG PO TABS: 500.0000 mg | ORAL_TABLET | Freq: Every day | ORAL | Status: DC

## 2013-09-13 MED ORDER — METHYLPREDNISOLONE (PAK) 4 MG PO TABS: ORAL_TABLET | ORAL | Status: DC

## 2013-09-13 NOTE — Progress Notes (Signed)
   Subjective:    Patient ID: [name], male    DOB: Jun 07, 1985, 28 y.o.   MRN: [id number]  HPI This 28 y.o. male presents for evaluation of left ear discomfort and uri sx's.   Review of Systems C/o left ear pain No chest pain, SOB, HA, dizziness, vision change, N/V, diarrhea, constipation, dysuria, urinary urgency or frequency, myalgias, arthralgias or rash.     Objective:   Physical Exam  Vital signs noted  Well developed well nourished male.  HEENT - Head atraumatic Normocephalic                Eyes - PERRLA, Conjuctiva - clear Sclera- Clear EOMI                Ears - Left TM injected and right TM normal                Throat - oropharanx wnl Respiratory - Lungs CTA bilateral Cardiac - RRR S1 and S2 without murmur GI - Abdomen soft Nontender and bowel sounds active x 4      Assessment & Plan:  LOM (left otitis media) - Plan: methylPREDNIsolone (MEDROL DOSPACK) 4 MG tablet, levofloxacin (LEVAQUIN) 500 MG tablet  Push po fluids, rest, tylenol and motrin otc prn as directed for fever, arthralgias, and myalgias.  Follow up prn if sx's continue or persist.  Lysbeth Penner FNP

## 2013-09-13 NOTE — Telephone Encounter (Signed)
Received fax from pharmacy: 100 mg capsule of Hydroxyzine not available. Could they have new RX for 50 mg, take 2 at HS?

## 2013-09-14 ENCOUNTER — Other Ambulatory Visit (HOSPITAL_COMMUNITY): Payer: Self-pay | Admitting: Psychiatry

## 2013-09-14 ENCOUNTER — Ambulatory Visit (HOSPITAL_COMMUNITY): Payer: Self-pay | Admitting: Psychiatry

## 2013-09-14 MED ORDER — HYDROXYZINE PAMOATE 50 MG PO CAPS: 100.0000 mg | ORAL_CAPSULE | Freq: Every evening | ORAL | Status: DC | PRN

## 2013-10-11 ENCOUNTER — Other Ambulatory Visit: Payer: Self-pay | Admitting: Pediatrics

## 2013-10-12 ENCOUNTER — Ambulatory Visit (INDEPENDENT_AMBULATORY_CARE_PROVIDER_SITE_OTHER): Payer: Medicare Other | Admitting: Psychiatry

## 2013-10-12 ENCOUNTER — Encounter (HOSPITAL_COMMUNITY): Payer: Self-pay | Admitting: Psychiatry

## 2013-10-12 VITALS — BP 121/69 | HR 77 | Ht 60.5 in | Wt 225.6 lb

## 2013-10-12 DIAGNOSIS — F259 Schizoaffective disorder, unspecified: Secondary | ICD-10-CM

## 2013-10-12 DIAGNOSIS — F209 Schizophrenia, unspecified: Secondary | ICD-10-CM

## 2013-10-12 DIAGNOSIS — F0390 Unspecified dementia without behavioral disturbance: Secondary | ICD-10-CM | POA: Diagnosis not present

## 2013-10-12 MED ORDER — TRAZODONE HCL 100 MG PO TABS: ORAL_TABLET | ORAL | Status: DC

## 2013-10-12 MED ORDER — PALIPERIDONE ER 3 MG PO TB24: ORAL_TABLET | ORAL | Status: DC

## 2013-10-12 MED ORDER — HYDROXYZINE PAMOATE 50 MG PO CAPS: 50.0000 mg | ORAL_CAPSULE | Freq: Three times a day (TID) | ORAL | Status: DC | PRN

## 2013-10-12 NOTE — Progress Notes (Signed)
Patient ID: Sergio [name], male   DOB: [birth year], 28 y.o.   MRN: [id number]  [place] [id number] Progress Note  Sergio [name] [name] [id number] 28 y.o.  Date of visit 06/15/2013 Chief Complaint: I am doing OK but I want you to know I'm a movie star and I have 5 girl friends  History of Present Illness: Patient is a 10 year old diagnosed with schizoaffective disorder, Down syndrome, GERD and hydrocephalus who presents today for a followup visit.  Mom reports that the patient's doing fairly well. 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 Mom adds that patient continues to have his imaginary friends but is no longer agitated and seems to be doing well.  In regards to his daily activities, mom reports that patient still attends the adult center 3-4 times a week and continues to have [employer] which she provides for him on the other days.  In regards to this hallucinations, mom reports that he does still complains about the " weirdo's" and having these imaginary girl friends. She states that but do not bother him, mostly provide him company. She states that he continues to remain stable.  In regards to sleep, mom reports that the patient is sleeping better. She has that he gets up says that he gets fixated on things at times during the day and wonders if he could have a low dose of Vistaril to help him with that   She denies any side effects of the medications, any other complaints, any safety issues at this visit   Suicidal Ideation: No Plan Formed: No Patient has means to carry out plan: No  Homicidal Ideation: No Plan Formed: No Patient has means to carry out plan: No  Review of Systems  Constitutional: Negative.  Negative for fever, weight loss and malaise/fatigue.  HENT: Negative.  Negative for congestion and sore throat.   Eyes: Negative.  Negative for blurred vision.  Respiratory: Negative.  Negative for cough,  shortness of breath and wheezing.   Cardiovascular: Negative.  Negative for chest pain and palpitations.  Gastrointestinal: Negative.  Negative for heartburn, nausea and vomiting.  Musculoskeletal: Negative.  Negative for falls, joint pain and myalgias.  Skin: Negative.  Negative for itching and rash.  Neurological: Negative.  Negative for dizziness, sensory change, speech change, focal weakness, seizures, loss of consciousness, weakness and headaches.  Endo/Heme/Allergies: Negative.  Negative for environmental allergies.  Psychiatric/Behavioral: Positive for hallucinations. Negative for depression, suicidal ideas, memory loss and substance abuse. The patient is not nervous/anxious and does not have insomnia.     Past Medical Family, Social History:  Lives with parents in [place], patient attends the adult daycare center 2-3 times a week Past Medical History  Diagnosis Date  . Down's syndrome   . GERD (gastroesophageal reflux disease)   . Hydrocephalus   . Schizoaffective disorder   . Dementia   . Memory loss   . Headache(784.0)   . Difficulty swallowing   . Weakness   . Insomnia   . Dysphagia causing pulmonary aspiration with swallowing   . OSA (obstructive sleep apnea)   . Pituitary adenoma    Family History  Problem Relation Age of Onset  . Hypertension Mother   . Renal Disease Father   . Emphysema Maternal Grandfather     Died at 79    Outpatient Encounter Prescriptions as of 10/12/2013  Medication Sig  . cholecalciferol (VITAMIN D) 1000  UNITS tablet Take 6,000 Units by mouth daily.   Marland Kitchen donepezil (ARICEPT) 10 MG tablet TAKE ONE TABLET AT BEDTIME  . hydrOXYzine (VISTARIL) 50 MG capsule Take 2 capsules (100 mg total) by mouth at bedtime as needed for itching.  Marland Kitchen ipratropium-albuterol (DUONEB) 0.5-2.5 (3) MG/3ML SOLN Take 3 mLs by nebulization every 6 (six) hours as needed. Dx 493.90  . levofloxacin (LEVAQUIN) 500 MG tablet Take 1 tablet (500 mg total) by mouth  daily.  . Melatonin 10 MG TABS Take 1 tablet by mouth at bedtime.  . methylPREDNIsolone (MEDROL DOSPACK) 4 MG tablet follow package directions  . NEXIUM 40 MG capsule Take 1 capsule (40 mg total) by mouth daily before breakfast.  . paliperidone (INVEGA) 3 MG 24 hr tablet TAKE ONE TABLET AT BEDTIME  . traZODone (DESYREL) 100 MG tablet TAKE 3 TABLETS AT BEDTIME  . trimethoprim-polymyxin b (POLYTRIM) ophthalmic solution Place 2 drops into both eyes every 4 (four) hours.    Past Psychiatric History/Hospitalization(s): Anxiety: No Bipolar Disorder: No Depression: No Mania: No Psychosis: Yes Schizophrenia: Yes Personality Disorder: No Hospitalization for psychiatric illness: No History of Electroconvulsive Shock Therapy: No Prior Suicide Attempts: No  Physical Exam: Constitutional:  There were no vitals taken for this visit.  General Appearance: alert, oriented, no acute distress and obese  Musculoskeletal: Strength & Muscle Tone: within normal limits Gait & Station: broad based Patient leans: N/A  Psychiatric: Speech (describe rate, volume, coherence, spontaneity, and abnormalities if any):  Slow in rate but normal in volume and spontaneous  Thought Process (describe rate, content, abstract reasoning, and computation):  concrete  Associations: Relevant  Thoughts: delusions and hallucinations  Mental Status: Orientation: oriented to person and place Mood & Affect: normal affect Attention Span & Concentration:  OK Cognition patient is intellectually limited Recent and remote memories are appropriate for patient's intellectual ability Insight and judgment seems to fluctuate between fair to poor as patient is intellectually limited Fund of knowledge and language: Poor  Medical Decision Making (Choose Three): Established Problem, Stable/Improving (1), Review of Psycho-Social Stressors (1), Review of Last Therapy Session (1), Review of Medication Regimen & Side Effects (2) and  Review of New Medication or Change in Dosage (2)  Assessment: Axis I: schizo affective disorder, dementia  Axis II:  deferred  Axis [name]:  Down syndrome, GERD, status post pneumonia, hydrocephalus  Axis IV:  moderate  Axis V: 50   Plan:  Schizophrenia : Continue Invega 3 mg one at night for psychosis Insomnia: Continue trazodone 300 mg at bedtime for sleep. Continue melatonin 10 mg at bedtime for sleep Change Vistaril to 50 mg once when necessary daily for anxiety/agitation and 100 mg at bedtime to help with sleep.  GERD: Continue diet modification along with Prilosec to help with patient's GERD Obstructive sleep apnea: Use CPAP daily at night for sleep apnea. Mother reports that patient's alert during the day because of that Dementia: Continue Aricept 10 mg daily for dementia Aspiration pneumonia: Continue to take the medications with yogurt to prevent aspiration Down's syndrome: Mom is providing some of the CAPP Services for patient  Continue to attend adult day center Call when necessary Followup in 4 months This followup visit was of moderate medical complexity.   Hampton Abbot, MD

## 2013-11-09 ENCOUNTER — Ambulatory Visit (INDEPENDENT_AMBULATORY_CARE_PROVIDER_SITE_OTHER): Payer: Medicare Other

## 2013-11-09 ENCOUNTER — Ambulatory Visit (INDEPENDENT_AMBULATORY_CARE_PROVIDER_SITE_OTHER): Payer: Medicare Other | Admitting: Family Medicine

## 2013-11-09 ENCOUNTER — Encounter: Payer: Self-pay | Admitting: Family Medicine

## 2013-11-09 VITALS — HR 103 | Temp 99.3°F | Ht 61.0 in | Wt 227.8 lb

## 2013-11-09 DIAGNOSIS — R0602 Shortness of breath: Secondary | ICD-10-CM | POA: Diagnosis not present

## 2013-11-09 DIAGNOSIS — H6500 Acute serous otitis media, unspecified ear: Secondary | ICD-10-CM

## 2013-11-09 DIAGNOSIS — H6502 Acute serous otitis media, left ear: Secondary | ICD-10-CM

## 2013-11-09 DIAGNOSIS — J209 Acute bronchitis, unspecified: Secondary | ICD-10-CM | POA: Diagnosis not present

## 2013-11-09 MED ORDER — METHYLPREDNISOLONE ACETATE 80 MG/ML IJ SUSP
80.0000 mg | Freq: Once | INTRAMUSCULAR | Status: AC
  Administered 2013-11-09: 80 mg via INTRAMUSCULAR

## 2013-11-09 MED ORDER — LEVOFLOXACIN 500 MG PO TABS: 500.0000 mg | ORAL_TABLET | Freq: Every day | ORAL | Status: DC

## 2013-11-09 NOTE — Progress Notes (Signed)
   Subjective:    Patient ID: Traci Sermon III, male    DOB: [birth year], 28 y.o.   MRN: [id number]  HPI This 28 y.o. male presents for evaluation of SOB and cough.  He is having some fever.   Review of Systems No chest pain, SOB, HA, dizziness, vision change, N/V, diarrhea, constipation, dysuria, urinary urgency or frequency, myalgias, arthralgias or rash.     Objective:   Physical Exam Vital signs noted  Well developed well nourished male.  HEENT - Head atraumatic Normocephalic                Eyes - PERRLA, Conjuctiva - clear Sclera- Clear EOMI                Ears - EAC's Wnl AS TM injected and AD normal                Nose - Nares patent                 Throat - oropharanx wnl Respiratory - Lungs diminished throughout Cardiac - RRR S1 and S2 without murmur GI - Abdomen soft Nontender and bowel sounds active x 4 Extremities - No edema. Neuro - Grossly intact.  CXR - no infiltrates Prelimnary reading by Iverson Alamin    Assessment & Plan:  Shortness of breath - Plan: DG Chest 2 View, methylPREDNISolone acetate (DEPO-MEDROL) injection 80 mg  Acute serous otitis media of left ear, recurrence not specified - Plan: levofloxacin (LEVAQUIN) 500 MG tablet, methylPREDNISolone acetate (DEPO-MEDROL) injection 80 mg  Acute bronchitis, unspecified organism - Plan: methylPREDNISolone acetate (DEPO-MEDROL) injection 80 mg  Recommend mucinex otc Push po fluids, rest, tylenol and motrin otc prn as directed for fever, arthralgias, and myalgias.  Follow up prn if sx's continue or persist.  Lysbeth Penner FNP

## 2013-11-24 NOTE — Progress Notes (Signed)
   Subjective:    Patient ID: Sergio [name], male    DOB: 1985/12/06, 28 y.o.   MRN: [id number]  HPI  C/o sob, uri sx's, fever  Review of Systems     Objective:   Physical Exam       Post neb tx - Lungs with few wheezes bilateral and patient expresses that he feels less SOB and breathing is easier Assessment & Plan:

## 2013-12-07 ENCOUNTER — Ambulatory Visit (INDEPENDENT_AMBULATORY_CARE_PROVIDER_SITE_OTHER): Payer: Medicare Other | Admitting: Pediatrics

## 2013-12-07 ENCOUNTER — Encounter: Payer: Self-pay | Admitting: Pediatrics

## 2013-12-07 DIAGNOSIS — Q039 Congenital hydrocephalus, unspecified: Secondary | ICD-10-CM

## 2013-12-07 DIAGNOSIS — D352 Benign neoplasm of pituitary gland: Secondary | ICD-10-CM | POA: Diagnosis not present

## 2013-12-07 DIAGNOSIS — D353 Benign neoplasm of craniopharyngeal duct: Secondary | ICD-10-CM

## 2013-12-07 DIAGNOSIS — Q909 Down syndrome, unspecified: Secondary | ICD-10-CM | POA: Diagnosis not present

## 2013-12-07 DIAGNOSIS — F02818 Dementia in other diseases classified elsewhere, unspecified severity, with other behavioral disturbance: Secondary | ICD-10-CM | POA: Diagnosis not present

## 2013-12-07 DIAGNOSIS — F0281 Dementia in other diseases classified elsewhere with behavioral disturbance: Secondary | ICD-10-CM

## 2013-12-07 MED ORDER — DONEPEZIL HCL 10 MG PO TABS: ORAL_TABLET | ORAL | Status: DC

## 2013-12-07 NOTE — Progress Notes (Signed)
Patient: Sergio [name] MRN: [id number] Sex: male DOB: 09-Apr-1986  Provider: Jodi Geralds, MD Location of Care: [facility]  Note type: Routine return visit  History of Present Illness: Referral Source: Dr. Hampton Abbot History from: mother, patient and [facility] chart Chief Complaint: Trisomy 21  Sergio [name] is a 28 y.o. male who returns for evaluation and management of congenital hydrocephalus requiring ventriculoperitoneal shunt, partial agenesis of the corpus callosum, history of pituitary microadenoma with elevated prolactin levels, and morbid obesity.  Sergio [name] was seen on December 07, 2013 for the first time since April 05, 2013.  He has trisomy 21, congenital hydrocephalus with functioning ventriculoperitoneal shunt, agenesis of the corpus callosum, partial agenesis of the posterior corpus callosum including the splenium, history of schizophrenia with hallucinations, and a 3 x 5 mm pituitary adenoma with an elevated prolactin level.  He also carries a diagnosis of dementia with behavioral disturbance.  In speaking with his mother, I think that, that diagnosis is premature.  He showed no significant deterioration in his ability to communicate, to remember think, or to carry out activities of daily living.  He is placed on Aricept early on because of concerns about his possible dementia.  At that time he appeared to have deterioration in his cognition.  I think this is a reasonable thing to do based on our understanding the early treatment seems to maintain function.  It is clear based on the natural history of Alzheimer's dementia in adults with trisomy 21, that he will develop this sometime in the future.  He has significant problems with morbid obesity, but gained only 4.6 pounds since his last visit this is half what he had gained between visits last time.  He is followed by Dr. Hampton Abbot at [facility].  She saw him last a few months  ago.  Recently he experienced aspiration pneumonia which required 10-day course of Levaquin and prednisone.  He has a problem with aspiration.  This has been lessened by the use of pureed foods for his medications.  He has a cyst both inside and outside his nose which is going to be evaluated by an ENT.  He has problems with sleep apnea which is treated with CPAP.  He wears a full mask and keeps it in place.  This helps him sleep.  He has some paranoid ideations and at times has perseverative thoughts.  I think this has more to do with his behavioral and cognitive issues than a cognitive decline.  Review of Systems: 12 system review was remarkable for history of recent aspiration pneumonia treated as an outpatient.  Past Medical History  Diagnosis Date  . Down's syndrome   . GERD (gastroesophageal reflux disease)   . Hydrocephalus   . Schizoaffective disorder   . Dementia   . Memory loss   . Headache(784.0)   . Difficulty swallowing   . Weakness   . Insomnia   . Dysphagia causing pulmonary aspiration with swallowing   . OSA (obstructive sleep apnea)   . Pituitary adenoma    Hospitalizations: No., Head Injury: No., Nervous System Infections: No., Immunizations up to date: Yes.   Past Medical History Comments: Pneumonia end of July in 2015.  Birth History 4 lbs. 10 oz. Infant born at [redacted] weeks gestational age  Growth and Development was recalled as  global delay  Behavior History none  Surgical History Past Surgical History  Procedure Laterality Date  . Csf shunt    .  Brain surgery    . Ventriculoperitoneal shunt    . Tympanostomy tube placement Bilateral     Family History family history includes Emphysema in his maternal grandfather; Hypertension in his mother; Renal Disease in his father. Family history is negative for migraines, seizures, intellectual disability, blindness, deafness, birth defects, chromosomal disorder, or autism.  Social History History   Social  History  . Marital Status: Single    Spouse Name: N/A    Number of Children: N/A  . Years of Education: N/A   Social History Main Topics  . Smoking status: Never Smoker   . Smokeless tobacco: Never Used  . Alcohol Use: No  . Drug Use: No  . Sexual Activity: No   Other Topics Concern  . None   Social History Narrative  . None   Educational level special education Living with parents and sister  Hobbies/Interest: Enjoys watching TV and playing with family. School comments Sergio [name] completed [employer] in 2006, he now attends [employer] in [place].  Current Outpatient Prescriptions on File Prior to Visit  Medication Sig Dispense Refill  . cholecalciferol (VITAMIN D) 1000 UNITS tablet Take 6,000 Units by mouth daily.       Marland Kitchen donepezil (ARICEPT) 10 MG tablet TAKE ONE TABLET AT BEDTIME  31 tablet  2  . hydrOXYzine (VISTARIL) 50 MG capsule Take 1 capsule (50 mg total) by mouth 3 (three) times daily as needed for anxiety.  90 capsule  2  . ipratropium-albuterol (DUONEB) 0.5-2.5 (3) MG/3ML SOLN Take 3 mLs by nebulization every 6 (six) hours as needed. Dx 493.90  360 mL  5  . levofloxacin (LEVAQUIN) 500 MG tablet Take 1 tablet (500 mg total) by mouth daily.  14 tablet  0  . Melatonin 10 MG TABS Take 1 tablet by mouth at bedtime.      Marland Kitchen NEXIUM 40 MG capsule Take 1 capsule (40 mg total) by mouth daily before breakfast.  30 capsule  5  . paliperidone (INVEGA) 3 MG 24 hr tablet TAKE ONE TABLET AT BEDTIME  30 tablet  2  . traZODone (DESYREL) 100 MG tablet TAKE 3 TABLETS AT BEDTIME  90 tablet  2  . trimethoprim-polymyxin b (POLYTRIM) ophthalmic solution Place 2 drops into both eyes every 4 (four) hours.  10 mL  1   No current facility-administered medications on file prior to visit.   The medication list was reviewed and reconciled. All changes or newly prescribed medications were explained.  A complete medication list was provided to the patient/caregiver.  Allergies   Allergen Reactions  . Ceclor [Cefaclor] Anaphylaxis  . Penicillins Anaphylaxis    Physical Exam BP 99/59  Pulse 60  Ht 5' 0.5" (1.537 m)  Wt 226 lb 9.6 oz (102.785 kg)  BMI 43.51 kg/m2  General: alert, well developed, morbidly obese, in no acute distress, blond hair, blue eyes, right handed Head: Microcephalic, small ears, bilateral epicanthal folds, upward slanting eyes, small digits with mild clinodactyly, forefinger palmar crease, brushfield spots, abnormal dentition Ears, Nose and Throat: Otoscopic: Tympanic membranes normal.  Pharynx: oropharynx is pink without exudates or tonsillar hypertrophy. Neck: supple, full range of motion, no cranial or cervical bruits Respiratory: auscultation clear Cardiovascular: no murmurs, pulses are normal Musculoskeletal: no skeletal deformities or apparent scoliosis Skin: no rashes or neurocutaneous lesions  Neurologic Exam  Mental Status: alert; oriented to person; knowledge is below normal for age; language is limited, but he is able to name objects and follow commands Cranial  Nerves: visual fields are full to double simultaneous stimuli; extraocular movements are full and conjugate; Right pupil is round the left is regular, both are eccentric and react to light; funduscopic examination shows sharp disc margins with normal vessels; symmetric facial strength; midline tongue and uvula; air conduction is greater than bone conduction bilaterally; dysarthria but intelligible Motor: Normal strength, tone and mass; good fine motor movements; no pronator drift. Sensory: intact responses to cold, vibration, proprioception and stereognosis Coordination: good finger-to-nose, rapid repetitive alternating movements and finger apposition Gait and Station: Broad-based waddling gait with difficulty walking in tandem and poor balance Reflexes: symmetric and diminished bilaterally; no clonus; bilateral flexor plantar responses.  Assessment 1. Trisomy 21,  758.0. 2. Congenital hydrocephalus, 742.3. 3. Pituitary adenoma, 227.34. 4. Severe obesity, 278.01. 5. Dementia and conditions classified elsewhere with behavioral disturbance, 294.11.  Discussion As mentioned above I am not certainly truly fits the diagnosis of dementia.  I think that his cognitive disabilities though apparent, have been stable.  His shunt is functioning.  Pituitary adenoma appears to be stable on the basis of his most recent MRI scan on March 03, 2013.  Obesity remains his biggest risk factor for further disease.  It appears that his family is taking steps to slow the rate of his growth which is good.  He will return in six months for ongoing evaluation.  I spent 30 minutes of face-to-face time with Sergio [name] and his mother more than half of it in consultation.  Jodi Geralds MD

## 2013-12-08 DIAGNOSIS — Q039 Congenital hydrocephalus, unspecified: Secondary | ICD-10-CM | POA: Insufficient documentation

## 2013-12-08 DIAGNOSIS — Q909 Down syndrome, unspecified: Secondary | ICD-10-CM | POA: Insufficient documentation

## 2013-12-09 DIAGNOSIS — G4733 Obstructive sleep apnea (adult) (pediatric): Secondary | ICD-10-CM | POA: Diagnosis not present

## 2013-12-09 DIAGNOSIS — R221 Localized swelling, mass and lump, neck: Secondary | ICD-10-CM | POA: Diagnosis not present

## 2013-12-09 DIAGNOSIS — R220 Localized swelling, mass and lump, head: Secondary | ICD-10-CM | POA: Diagnosis not present

## 2013-12-09 DIAGNOSIS — J301 Allergic rhinitis due to pollen: Secondary | ICD-10-CM | POA: Diagnosis not present

## 2014-01-26 ENCOUNTER — Ambulatory Visit (INDEPENDENT_AMBULATORY_CARE_PROVIDER_SITE_OTHER): Payer: Medicare Other | Admitting: Family Medicine

## 2014-01-26 ENCOUNTER — Encounter: Payer: Self-pay | Admitting: Family Medicine

## 2014-01-26 VITALS — BP 116/76 | HR 74 | Temp 98.5°F | Ht 60.5 in | Wt 234.0 lb

## 2014-01-26 DIAGNOSIS — J209 Acute bronchitis, unspecified: Secondary | ICD-10-CM

## 2014-01-26 DIAGNOSIS — J3489 Other specified disorders of nose and nasal sinuses: Secondary | ICD-10-CM | POA: Diagnosis not present

## 2014-01-26 DIAGNOSIS — R059 Cough, unspecified: Secondary | ICD-10-CM | POA: Diagnosis not present

## 2014-01-26 DIAGNOSIS — R05 Cough: Secondary | ICD-10-CM | POA: Diagnosis not present

## 2014-01-26 LAB — POCT CBC
GRANULOCYTE PERCENT: 74.4 % (ref 37–80)
HEMATOCRIT: 43.6 % (ref 43.5–53.7)
Hemoglobin: 14.2 g/dL (ref 14.1–18.1)
Lymph, poc: 2.3 (ref 0.6–3.4)
MCH, POC: 29.7 pg (ref 27–31.2)
MCHC: 32.6 g/dL (ref 31.8–35.4)
MCV: 91 fL (ref 80–97)
MPV: 7.3 fL (ref 0–99.8)
POC Granulocyte: 7.7 — AB (ref 2–6.9)
POC LYMPH PERCENT: 22.7 %L (ref 10–50)
Platelet Count, POC: 232 10*3/uL (ref 142–424)
RBC: 4.8 M/uL (ref 4.69–6.13)
RDW, POC: 14.8 %
WBC: 10.3 10*3/uL — AB (ref 4.6–10.2)

## 2014-01-26 MED ORDER — AZITHROMYCIN 250 MG PO TABS: ORAL_TABLET | ORAL | Status: DC

## 2014-01-26 MED ORDER — PREDNISONE 10 MG PO TABS: ORAL_TABLET | ORAL | Status: DC

## 2014-01-26 MED ORDER — METHYLPREDNISOLONE ACETATE 80 MG/ML IJ SUSP
60.0000 mg | Freq: Once | INTRAMUSCULAR | Status: AC
  Administered 2014-01-26: 60 mg via INTRAMUSCULAR

## 2014-01-26 NOTE — Progress Notes (Signed)
Subjective:    Patient ID: Traci Sermon III, male    DOB: 19-Apr-1986, 28 y.o.   MRN: [id number]  HPI Patient here today for cough and congestion that started about 2 days ago. He is accompanied today by his father. The patient has cough, wheezing, and congestion. He is not having fever. This is been going on for a couple of days.         Patient Active Problem List   Diagnosis Date Noted  . Trisomy 21 12/08/2013  . Congenital hydrocephalus 12/08/2013  . Need for prophylactic vaccination and inoculation against influenza 05/11/2013  . Right otitis externa 05/11/2013  . Tinea pedis 05/11/2013  . Severe obesity (BMI >= 40) 04/05/2013  . Unspecified vitamin D deficiency 01/23/2013  . OSA (obstructive sleep apnea)   . Pituitary adenoma   . Recurrent aspiration pneumonia 09/23/2012  . Hypersomnia with sleep apnea, unspecified 09/23/2012  . Ingrown nail 09/23/2012  . Unspecified asthma(493.90) 09/23/2012  . Anterior pituitary hormones causing adverse effect in therapeutic use 09/02/2012  . Headache(784.0) 09/02/2012  . 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 Dementia in conditions classified elsewhere with behavioral disturbance 09/02/2012  . Shortness of breath 07/05/2012  . Community acquired pneumonia 07/02/2012  . Pneumonia 07/06/2011  . Fever 07/06/2011  . GERD (gastroesophageal reflux disease) 07/06/2011  . Schizoaffective disorder    Outpatient Encounter Prescriptions as of 01/26/2014  Medication Sig  . cholecalciferol (VITAMIN D) 1000 UNITS tablet Take 6,000 Units by mouth daily.   Marland Kitchen donepezil (ARICEPT) 10 MG tablet Take one tablet at bedtime  . hydrOXYzine (VISTARIL) 50 MG capsule Take 1 capsule (50 mg total) by mouth 3 (three) times daily as needed for anxiety.  Marland Kitchen ipratropium-albuterol (DUONEB) 0.5-2.5 (3) MG/3ML SOLN Take 3 mLs by nebulization every 6 (six) hours as needed. Dx 493.90  . Melatonin 10 MG TABS Take 1  tablet by mouth at bedtime.  Marland Kitchen NEXIUM 40 MG capsule Take 1 capsule (40 mg total) by mouth daily before breakfast.  . paliperidone (INVEGA) 3 MG 24 hr tablet TAKE ONE TABLET AT BEDTIME  . traZODone (DESYREL) 100 MG tablet TAKE 3 TABLETS AT BEDTIME    Review of Systems  Constitutional: Positive for fatigue. Negative for fever.  HENT: Positive for congestion.   Eyes: Negative.   Respiratory: Positive for cough and wheezing.   Cardiovascular: Negative.   Gastrointestinal: Negative.   Endocrine: Negative.   Genitourinary: Negative.   Musculoskeletal: Negative.   Skin: Negative.   Allergic/Immunologic: Negative.   Neurological: Negative.   Hematological: Negative.   Psychiatric/Behavioral: Negative.        Objective:   Physical Exam  Nursing note and vitals reviewed. Constitutional: He appears well-developed and well-nourished. No distress.  HENT:  Head: Normocephalic and atraumatic.  Left Ear: External ear normal.  Nose: Nose normal.  Mouth/Throat: Oropharynx is clear and moist. No oropharyngeal exudate.  There is ear cerumen in the right ear canal. The left TM is normal. The throat appears to be normal. There is a lot of nasal congestion and rhinorrhea. This is clear in color  Eyes: Conjunctivae and EOM are normal. Pupils are equal, round, and reactive to light. Right eye exhibits no discharge. Left eye exhibits no discharge. No scleral icterus.  Neck: Normal range of motion. Neck supple. No thyromegaly present.  There are no anterior cervical nodes present or palpable  Cardiovascular: Normal rate, regular rhythm, normal heart sounds and intact distal pulses.   No  murmur heard. Pulmonary/Chest: Effort normal. No respiratory distress. He has wheezes. He has no rales. He exhibits no tenderness.  Sparsely scattered wheezes. No rales. With coughing there is some chest congestion.  Abdominal: Soft. Bowel sounds are normal. He exhibits no mass. There is no tenderness. There is no  rebound and no guarding.  Obese without masses or tender  Musculoskeletal: Normal range of motion. He exhibits no edema and no tenderness.  Lymphadenopathy:    He has no cervical adenopathy.  Neurological: He is alert.  The patient was response appropriately to questions he does have a history of schizophrenia  Skin: Skin is warm and dry. No rash noted. No erythema. No pallor.  Psychiatric: He has a normal mood and affect. His behavior is normal. Judgment and thought content normal.   BP 116/76  Pulse 74  Temp(Src) 98.5 F (36.9 C) (Oral)  Ht 5' 0.5" (1.537 m)  Wt 234 lb (106.142 kg)  BMI 44.93 kg/m2  Results for orders placed in visit on 01/26/14  POCT CBC      Result Value Ref Range   WBC 10.3 (*) 4.6 - 10.2 K/uL   Lymph, poc 2.3  0.6 - 3.4   POC LYMPH PERCENT 22.7  10 - 50 %L   POC Granulocyte 7.7 (*) 2 - 6.9   Granulocyte percent 74.4  37 - 80 %G   RBC 4.8  4.69 - 6.13 M/uL   Hemoglobin 14.2  14.1 - 18.1 g/dL   HCT, POC 43.6  43.5 - 53.7 %   MCV 91.0  80 - 97 fL   MCH, POC 29.7  27 - 31.2 pg   MCHC 32.6  31.8 - 35.4 g/dL   RDW, POC 14.8     Platelet Count, POC 232.0  142 - 424 K/uL   MPV 7.3  0 - 99.8 fL         Assessment & Plan:  1. Cough - POCT CBC - predniSONE (DELTASONE) 10 MG tablet; TAPER- 1 tab by mouth four times daily for 2 days, 1 tab by mouth three times daily for 2 days, 1 tab by mouth twice a day for 2 days, then 1 tab by mouth daily for 2 days, then stop.  Dispense: 20 tablet; Refill: 0 - azithromycin (ZITHROMAX) 250 MG tablet; As directed  Dispense: 6 tablet; Refill: 0 - methylPREDNISolone acetate (DEPO-MEDROL) injection 60 mg; Inject 0.75 mLs (60 mg total) into the muscle once.  2. Acute bronchitis, unspecified organism  3. Rhinorrhea  4. Bronchospasm with bronchitis, acute  Patient Instructions  Continue with nebulizer treatment Use saline irrigation for nasal congestion frequently Drink plenty of fluids Take Tylenol as needed for aches  pains and fever Take prednisone as directed Take antibiotic as directed Continue with Mucinex DM with a large glass of water twice daily   Arrie Senate MD

## 2014-01-26 NOTE — Patient Instructions (Signed)
Continue with nebulizer treatment Use saline irrigation for nasal congestion frequently Drink plenty of fluids Take Tylenol as needed for aches pains and fever Take prednisone as directed Take antibiotic as directed Continue with Mucinex DM with a large glass of water twice daily

## 2014-01-31 ENCOUNTER — Encounter: Payer: Self-pay | Admitting: Family Medicine

## 2014-01-31 ENCOUNTER — Ambulatory Visit (INDEPENDENT_AMBULATORY_CARE_PROVIDER_SITE_OTHER): Payer: Medicare Other | Admitting: Family Medicine

## 2014-01-31 VITALS — BP 127/77 | HR 61 | Temp 96.5°F | Ht 60.5 in | Wt 230.8 lb

## 2014-01-31 DIAGNOSIS — J208 Acute bronchitis due to other specified organisms: Secondary | ICD-10-CM

## 2014-01-31 DIAGNOSIS — Z111 Encounter for screening for respiratory tuberculosis: Secondary | ICD-10-CM | POA: Diagnosis not present

## 2014-01-31 DIAGNOSIS — J209 Acute bronchitis, unspecified: Secondary | ICD-10-CM

## 2014-01-31 DIAGNOSIS — Z23 Encounter for immunization: Secondary | ICD-10-CM

## 2014-01-31 MED ORDER — LEVOFLOXACIN 500 MG PO TABS: 500.0000 mg | ORAL_TABLET | Freq: Every day | ORAL | Status: DC

## 2014-01-31 NOTE — Progress Notes (Signed)
   Subjective:    Patient ID: Sergio [name], male    DOB: 04-11-1986, 28 y.o.   MRN: [id number]  HPI This 28 y.o. male presents for evaluation of URI sx's and persistent cough.  He was recently seen and tx'd with zpak and steroid taper which the father states has helped but he still has some residual sx's that are hanging on.   Review of Systems C/o cough   No chest pain, SOB, HA, dizziness, vision change, N/V, diarrhea, constipation, dysuria, urinary urgency or frequency, myalgias, arthralgias or rash.  Objective:   Physical Exam  Vital signs noted  Well developed well nourished male.  HEENT - Head atraumatic Normocephalic                Eyes - PERRLA, Conjuctiva - clear Sclera- Clear EOMI                Ears - EAC's Wnl TM's Wnl Gross Hearing WNL                Nose - Nares patent                 Throat - oropharanx wnl Respiratory - Lungs with coarse breath sounds right chest and CTA left chest Cardiac - RRR S1 and S2 without murmur GI - Abdomen soft Nontender and bowel sounds active x 4       Assessment & Plan:  Need for prophylactic vaccination and inoculation against unspecified single disease - Plan: PPD  Acute bronchitis due to other specified organisms - Plan: levofloxacin (LEVAQUIN) 500 MG tablet  Push po fluids, rest, tylenol and motrin otc prn as directed for fever, arthralgias, and myalgias.  Follow up prn if sx's continue or persist.  Lysbeth Penner FNP

## 2014-02-02 LAB — TB SKIN TEST
Induration: 0 mm
TB Skin Test: NEGATIVE

## 2014-02-08 ENCOUNTER — Other Ambulatory Visit (HOSPITAL_COMMUNITY): Payer: Self-pay | Admitting: Psychiatry

## 2014-02-14 ENCOUNTER — Ambulatory Visit (HOSPITAL_COMMUNITY): Payer: Self-pay | Admitting: Psychiatry

## 2014-02-21 ENCOUNTER — Other Ambulatory Visit (HOSPITAL_COMMUNITY): Payer: Self-pay | Admitting: Psychiatry

## 2014-02-22 ENCOUNTER — Other Ambulatory Visit: Payer: Self-pay | Admitting: Family Medicine

## 2014-03-14 ENCOUNTER — Other Ambulatory Visit (HOSPITAL_COMMUNITY): Payer: Self-pay | Admitting: Psychiatry

## 2014-03-24 ENCOUNTER — Ambulatory Visit (INDEPENDENT_AMBULATORY_CARE_PROVIDER_SITE_OTHER): Payer: Medicare Other | Admitting: Family Medicine

## 2014-03-24 ENCOUNTER — Encounter: Payer: Self-pay | Admitting: Family Medicine

## 2014-03-24 ENCOUNTER — Ambulatory Visit (INDEPENDENT_AMBULATORY_CARE_PROVIDER_SITE_OTHER): Payer: Medicare Other | Admitting: *Deleted

## 2014-03-24 VITALS — BP 104/65 | HR 67 | Temp 97.8°F | Ht 60.0 in | Wt 237.8 lb

## 2014-03-24 DIAGNOSIS — F259 Schizoaffective disorder, unspecified: Secondary | ICD-10-CM

## 2014-03-24 DIAGNOSIS — Z0000 Encounter for general adult medical examination without abnormal findings: Secondary | ICD-10-CM

## 2014-03-24 DIAGNOSIS — B353 Tinea pedis: Secondary | ICD-10-CM | POA: Diagnosis not present

## 2014-03-24 DIAGNOSIS — K219 Gastro-esophageal reflux disease without esophagitis: Secondary | ICD-10-CM | POA: Diagnosis not present

## 2014-03-24 DIAGNOSIS — Q039 Congenital hydrocephalus, unspecified: Secondary | ICD-10-CM

## 2014-03-24 DIAGNOSIS — Z23 Encounter for immunization: Secondary | ICD-10-CM | POA: Diagnosis not present

## 2014-03-24 DIAGNOSIS — Q909 Down syndrome, unspecified: Secondary | ICD-10-CM | POA: Diagnosis not present

## 2014-03-24 MED ORDER — SULFAMETHOXAZOLE-TRIMETHOPRIM 800-160 MG PO TABS: 1.0000 | ORAL_TABLET | Freq: Two times a day (BID) | ORAL | Status: DC

## 2014-03-24 NOTE — Addendum Note (Signed)
Addended by: Pollyann Kennedy F on: 03/24/2014 04:19 PM   Modules accepted: Orders

## 2014-03-24 NOTE — Progress Notes (Signed)
   Subjective:    Patient ID: Sergio [name], male    DOB: 04-Jul-1985, 28 y.o.   MRN: [id number]  HPI 28 year old man with history of Down's syndrome who is here for a physical. Accompanied by mom who is his primary caretaker. She has 2 concerns one small area on the right posterior shoulder that she wonders about skin cancer. Also on his inner thighs he tends to get cysts. There are 2 present now Generally behaviors and symptoms are well controlled. Of note diagnosis of Alzheimer's has been made and he is on Aricept. I spent some time discussing the efficacy or lack of with this drug, but hopefully did not confuse her with my opinions    Review of Systems  Constitutional: Negative.   Cardiovascular: Negative.   Gastrointestinal: Negative.   Skin: Negative.   Psychiatric/Behavioral: Positive for behavioral problems.       Objective:   Physical Exam  Constitutional:  Typical appearance of young man with Down syndrome  HENT:  Head: Normocephalic.  Right Ear: External ear normal.  Left Ear: External ear normal.  Eyes: Pupils are equal, round, and reactive to light.  Neck: Normal range of motion.  Cardiovascular: Normal rate and regular rhythm.   Pulmonary/Chest: Effort normal.  Neurological: He is alert.  Skin:  Discolored area right upper posterior shoulder. Well-circumscribed slightly tender probable cyst within the dermis. Reassured that this is not typical in any way of skin cancer  There are cysts on the right inner thigh no surrounding erythema    BP 104/65 mmHg  Pulse 67  Temp(Src) 97.8 F (36.6 C) (Oral)  Ht 5' (1.524 m)  Wt 237 lb 12.8 oz (107.865 kg)  BMI 46.44 kg/m2      Assessment & Plan:  1. Gastroesophageal reflux disease, esophagitis presence not specified   2. Tinea pedis, unspecified laterality Recommend Lamisil and keeping area between toes as dry as possible  3. Congenital hydrocephalus   4. Schizoaffective disorder, unspecified type   5.  Trisomy 31  Wardell Honour MD

## 2014-03-25 ENCOUNTER — Telehealth: Payer: Self-pay | Admitting: *Deleted

## 2014-03-25 LAB — CBC WITH DIFFERENTIAL
Basophils Absolute: 0.1 10*3/uL (ref 0.0–0.2)
Basos: 1 %
EOS: 1 %
Eosinophils Absolute: 0.1 10*3/uL (ref 0.0–0.4)
HCT: 41.9 % (ref 37.5–51.0)
HEMOGLOBIN: 13.8 g/dL (ref 12.6–17.7)
IMMATURE GRANS (ABS): 0 10*3/uL (ref 0.0–0.1)
IMMATURE GRANULOCYTES: 0 %
Lymphocytes Absolute: 2 10*3/uL (ref 0.7–3.1)
Lymphs: 29 %
MCH: 29.9 pg (ref 26.6–33.0)
MCHC: 32.9 g/dL (ref 31.5–35.7)
MCV: 91 fL (ref 79–97)
MONOCYTES: 7 %
Monocytes Absolute: 0.5 10*3/uL (ref 0.1–0.9)
NEUTROS PCT: 62 %
Neutrophils Absolute: 4.2 10*3/uL (ref 1.4–7.0)
Platelets: 234 10*3/uL (ref 150–379)
RBC: 4.62 x10E6/uL (ref 4.14–5.80)
RDW: 15.1 % (ref 12.3–15.4)
WBC: 6.8 10*3/uL (ref 3.4–10.8)

## 2014-03-25 LAB — CMP14+EGFR
ALBUMIN: 4 g/dL (ref 3.5–5.5)
ALT: 29 IU/L (ref 0–44)
AST: 16 IU/L (ref 0–40)
Albumin/Globulin Ratio: 1.6 (ref 1.1–2.5)
Alkaline Phosphatase: 53 IU/L (ref 39–117)
BUN/Creatinine Ratio: 15 (ref 8–19)
BUN: 16 mg/dL (ref 6–20)
CALCIUM: 9.1 mg/dL (ref 8.7–10.2)
CO2: 25 mmol/L (ref 18–29)
CREATININE: 1.06 mg/dL (ref 0.76–1.27)
Chloride: 100 mmol/L (ref 97–108)
GFR calc Af Amer: 110 mL/min/{1.73_m2} (ref >59)
GFR calc non Af Amer: 95 mL/min/{1.73_m2} (ref >59)
Globulin, Total: 2.5 g/dL (ref 1.5–4.5)
Glucose: 121 mg/dL — ABNORMAL HIGH (ref 65–99)
Potassium: 4.4 mmol/L (ref 3.5–5.2)
SODIUM: 138 mmol/L (ref 134–144)
Total Bilirubin: <0.2 mg/dL (ref 0.0–1.2)
Total Protein: 6.5 g/dL (ref 6.0–8.5)

## 2014-03-25 NOTE — Telephone Encounter (Signed)
-----   Message from Wardell Honour, MD sent at 03/25/2014  7:43 AM EST ----- Labs are all within normal limits this was not a fasting specimen so elevation sugar is okay

## 2014-03-25 NOTE — Telephone Encounter (Signed)
Calling pt to let him know all labs are within normal limits.

## 2014-03-29 ENCOUNTER — Other Ambulatory Visit: Payer: Self-pay

## 2014-03-29 MED ORDER — KETOCONAZOLE 2 % EX CREA: 1.0000 "application " | TOPICAL_CREAM | Freq: Two times a day (BID) | CUTANEOUS | Status: DC

## 2014-03-29 NOTE — Telephone Encounter (Signed)
Last seen 03/24/14  Dr Sabra Heck  This med not on EPIC list

## 2014-04-06 ENCOUNTER — Other Ambulatory Visit (HOSPITAL_COMMUNITY): Payer: Self-pay | Admitting: Psychiatry

## 2014-05-30 ENCOUNTER — Other Ambulatory Visit (HOSPITAL_COMMUNITY): Payer: Self-pay | Admitting: Psychiatry

## 2014-07-07 ENCOUNTER — Other Ambulatory Visit (HOSPITAL_COMMUNITY): Payer: Self-pay | Admitting: Psychiatry

## 2014-07-15 ENCOUNTER — Encounter: Payer: Self-pay | Admitting: Family Medicine

## 2014-07-15 ENCOUNTER — Ambulatory Visit (INDEPENDENT_AMBULATORY_CARE_PROVIDER_SITE_OTHER): Payer: Medicare Other | Admitting: Family Medicine

## 2014-07-15 ENCOUNTER — Ambulatory Visit (INDEPENDENT_AMBULATORY_CARE_PROVIDER_SITE_OTHER): Payer: Medicare Other

## 2014-07-15 VITALS — BP 136/98 | HR 73 | Temp 97.8°F | Ht 60.0 in | Wt 237.0 lb

## 2014-07-15 DIAGNOSIS — R05 Cough: Secondary | ICD-10-CM

## 2014-07-15 DIAGNOSIS — R059 Cough, unspecified: Secondary | ICD-10-CM

## 2014-07-15 MED ORDER — AZITHROMYCIN 250 MG PO TABS: ORAL_TABLET | ORAL | Status: DC

## 2014-07-15 MED ORDER — BETAMETHASONE SOD PHOS & ACET 6 (3-3) MG/ML IJ SUSP
6.0000 mg | Freq: Once | INTRAMUSCULAR | Status: AC
  Administered 2014-07-15: 6 mg via INTRAMUSCULAR

## 2014-07-15 NOTE — Progress Notes (Addendum)
Subjective:    Patient ID: Sergio [name], male    DOB: 09-08-1985, 29 y.o.   MRN: [id number]  HPI Patient here today for cough, congestion and shortness of breath that started four days ago. No fever chills or sweats. Symptoms are moderate. In severity. Cough is dry. No chest pain.       Patient Active Problem List   Diagnosis Date Noted  . Trisomy 21 12/08/2013  . Congenital hydrocephalus 12/08/2013  . Need for prophylactic vaccination and inoculation against influenza 05/11/2013  . Right otitis externa 05/11/2013  . Tinea pedis 05/11/2013  . Severe obesity (BMI >= 40) 04/05/2013  . Unspecified vitamin D deficiency 01/23/2013  . OSA (obstructive sleep apnea)   . Pituitary adenoma   . Recurrent aspiration pneumonia 09/23/2012  . Hypersomnia with sleep apnea, unspecified 09/23/2012  . Ingrown nail 09/23/2012  . Unspecified asthma(493.90) 09/23/2012  . Anterior pituitary hormones causing adverse effect in therapeutic use 09/02/2012  . Headache(784.0) 09/02/2012  . 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 Dementia in conditions classified elsewhere with behavioral disturbance 09/02/2012  . Shortness of breath 07/05/2012  . Community acquired pneumonia 07/02/2012  . Pneumonia 07/06/2011  . Fever 07/06/2011  . GERD (gastroesophageal reflux disease) 07/06/2011  . Schizoaffective disorder    Outpatient Encounter Prescriptions as of 07/15/2014  Medication Sig  . cholecalciferol (VITAMIN D) 1000 UNITS tablet Take 6,000 Units by mouth daily.   Marland Kitchen donepezil (ARICEPT) 10 MG tablet Take one tablet at bedtime  . hydrOXYzine (VISTARIL) 50 MG capsule Take 1 capsule (50 mg total) by mouth 3 (three) times daily as neededf or anxiety.  Marland Kitchen ipratropium-albuterol (DUONEB) 0.5-2.5 (3) MG/3ML SOLN Take 3 mLs by nebulization every 6 (six) hours as needed. Dx 493.90  . ketoconazole (NIZORAL) 2 % cream Apply 1 application topically 2 (two) times  daily. Apply to feet 2 times a day  . Melatonin 10 MG TABS Take 1 tablet by mouth at bedtime.  Marland Kitchen NEXIUM 40 MG capsule TAKE (1) CAPSULE DAILY BEFORE BREAKFAST.  . paliperidone (INVEGA) 3 MG 24 hr tablet TAKE ONE TABLET AT BEDTIME  . traZODone (DESYREL) 100 MG tablet TAKE 3 TABLETS AT BEDTIME  . [DISCONTINUED] sulfamethoxazole-trimethoprim (BACTRIM DS,SEPTRA DS) 800-160 MG per tablet Take 1 tablet by mouth 2 (two) times daily.    Review of Systems  Constitutional: Negative.   HENT: Positive for congestion.   Eyes: Negative.   Respiratory: Positive for cough, shortness of breath and wheezing.   Cardiovascular: Negative.   Gastrointestinal: Negative.   Endocrine: Negative.   Genitourinary: Negative.   Musculoskeletal: Negative.   Skin: Negative.   Allergic/Immunologic: Negative.   Neurological: Negative.   Hematological: Negative.   Psychiatric/Behavioral: Negative.        Objective:   Physical Exam  Constitutional: He appears well-developed and well-nourished.  HENT:  Head: Normocephalic and atraumatic.  Right Ear: Tympanic membrane and external ear normal. No decreased hearing is noted.  Left Ear: Tympanic membrane and external ear normal. No decreased hearing is noted.  Nose: Mucosal edema present. Right sinus exhibits no frontal sinus tenderness. Left sinus exhibits no frontal sinus tenderness.  Mouth/Throat: No oropharyngeal exudate or posterior oropharyngeal erythema.  Neck: No Brudzinski's sign noted.  Pulmonary/Chest: Breath sounds normal. No respiratory distress.  Lymphadenopathy:       Head (right side): No preauricular adenopathy present.       Head (left side): No preauricular adenopathy present.  Right cervical: No superficial cervical adenopathy present.      Left cervical: No superficial cervical adenopathy present.   BP 136/98 mmHg  Pulse 73  Temp(Src) 97.8 F (36.6 C) (Oral)  Ht 5' (1.524 m)  Wt 237 lb (107.502 kg)  BMI 46.29 kg/m2  SpO2  91%        Assessment & Plan:   1. Cough     Meds ordered this encounter  Medications  . azithromycin (ZITHROMAX Z-PAK) 250 MG tablet    Sig: Take two right away Then one a day for the next 4 days.    Dispense:  6 each    Refill:  0  . betamethasone acetate-betamethasone sodium phosphate (CELESTONE) injection 6 mg    Sig:     Orders Placed This Encounter  Procedures  . DG Chest 2 View    Standing Status: Future     Number of Occurrences: 1     Standing Expiration Date: 09/14/2015    Order Specific Question:  Reason for Exam (SYMPTOM  OR DIAGNOSIS REQUIRED)    Answer:  wheezing and cough    Order Specific Question:  Preferred imaging location?    Answer:  Internal    Labs pending Health Maintenance reviewed Diet and exercise encouraged Continue all meds as discussed Follow up in 1wk if no better  Claretta Fraise, MD

## 2014-08-10 ENCOUNTER — Other Ambulatory Visit (HOSPITAL_COMMUNITY): Payer: Self-pay | Admitting: Psychiatry

## 2014-08-25 ENCOUNTER — Ambulatory Visit (INDEPENDENT_AMBULATORY_CARE_PROVIDER_SITE_OTHER): Payer: Medicare Other | Admitting: Psychiatry

## 2014-08-25 VITALS — BP 113/63 | HR 67 | Ht 61.0 in | Wt 241.6 lb

## 2014-08-25 DIAGNOSIS — F209 Schizophrenia, unspecified: Secondary | ICD-10-CM

## 2014-08-25 DIAGNOSIS — F0390 Unspecified dementia without behavioral disturbance: Secondary | ICD-10-CM | POA: Diagnosis not present

## 2014-08-25 DIAGNOSIS — F259 Schizoaffective disorder, unspecified: Secondary | ICD-10-CM

## 2014-08-25 MED ORDER — HYDROXYZINE PAMOATE 50 MG PO CAPS: ORAL_CAPSULE | ORAL | Status: DC

## 2014-08-25 MED ORDER — PALIPERIDONE ER 3 MG PO TB24: 3.0000 mg | ORAL_TABLET | Freq: Every day | ORAL | Status: DC

## 2014-08-25 MED ORDER — TRAZODONE HCL 100 MG PO TABS: 200.0000 mg | ORAL_TABLET | Freq: Every day | ORAL | Status: DC

## 2014-08-25 NOTE — Progress Notes (Signed)
Patient ID: Sergio [name], male   DOB: 1985-06-18, 29 y.o.   MRN: [id number]  [facility] Progress Note  Sergio [name] [id number] 29 y.o.  Date of visit : 08/25/2014 Chief Complaint: I am doing well at home and I like going to the adult center  History of Present Illness: Patient is a 29 year old diagnosed with schizoaffective disorder, Down syndrome, GERD and hydrocephalus who presents today for a followup visit.  Mom reports that the patient is doing fairly well at home and also the adult center. She states that patient still wakes up at night and has not seen any benefit with increasing the trazodone. She has that she's okay with lowering at as patient's sleep pattern does not improve with the medication. She adds that takes off the mask office C Pap machine and so ends up waking up in the middle of the night at times.  In regards to his daily activities, mom reports that patient still attends the adult center 3-4 times a week and continues to have [employer] which she provides for him on the other days.  In regards to this hallucinations, mom reports that they do not bother the patient, adds that they help patient keep himself occupied. On being questioned about this, patient reports that he has multiple girlfriends and that these girlfriends talk to him.  Mom reports that patient currently has a cold, has a sore throat and because of this is tired today. She adds that it's allergies as patient does not have a fever, a cough.   She denies any side effects of the medications, any other complaints, any safety issues at this visit   Suicidal Ideation: No Plan Formed: No Patient has means to carry out plan: No  Homicidal Ideation: No Plan Formed: No Patient has means to carry out plan: No  Review of Systems  Constitutional: Positive for malaise/fatigue. Negative for fever, chills and weight loss.  HENT: Positive for congestion and sore throat. Negative for  hearing loss.   Respiratory: Negative for cough, shortness of breath and wheezing.   Cardiovascular: Negative.  Negative for chest pain and palpitations.  Gastrointestinal: Negative.  Negative for heartburn, nausea, vomiting, abdominal pain, diarrhea and constipation.  Genitourinary: Negative.  Negative for dysuria.  Musculoskeletal: Negative.  Negative for myalgias and falls.  Skin: Negative.  Negative for itching and rash.  Neurological: Negative.  Negative for dizziness, focal weakness, seizures, loss of consciousness and headaches.  Endo/Heme/Allergies: Negative.  Negative for environmental allergies.  Psychiatric/Behavioral: Positive for hallucinations. Negative for depression, suicidal ideas, memory loss and substance abuse. The patient is not nervous/anxious and does not have insomnia.     Past Medical Family, Social History:  Lives with parents in [place], patient attends the adult daycare center 2-3 times a week Past Medical History  Diagnosis Date  . Down's syndrome   . GERD (gastroesophageal reflux disease)   . Hydrocephalus   . Schizoaffective disorder   . Dementia   . Memory loss   . Headache(784.0)   . Difficulty swallowing   . Weakness   . Insomnia   . Dysphagia causing pulmonary aspiration with swallowing   . OSA (obstructive sleep apnea)   . Pituitary adenoma    Family History  Problem Relation Age of Onset  . Hypertension Mother   . Renal Disease Father   . Emphysema Maternal Grandfather     Died at 75    Outpatient Encounter Prescriptions as of 08/25/2014  Medication Sig  . cholecalciferol (VITAMIN D) 1000 UNITS tablet Take 6,000 Units by mouth daily.   Marland Kitchen donepezil (ARICEPT) 10 MG tablet Take one tablet at bedtime  . hydrOXYzine (VISTARIL) 50 MG capsule Take 1 capsule (50 mg total) by mouth 3 (three) times daily as neededf or anxiety.  Marland Kitchen ipratropium-albuterol (DUONEB) 0.5-2.5 (3) MG/3ML SOLN Take 3 mLs by nebulization every 6 (six) hours as  needed. Dx 493.90  . ketoconazole (NIZORAL) 2 % cream Apply 1 application topically 2 (two) times daily. Apply to feet 2 times a day  . Melatonin 10 MG TABS Take 1 tablet by mouth at bedtime.  Marland Kitchen NEXIUM 40 MG capsule TAKE (1) CAPSULE DAILY BEFORE BREAKFAST.  . paliperidone (INVEGA) 3 MG 24 hr tablet Take 1 tablet (3 mg total) by mouth at bedtime.  . traZODone (DESYREL) 100 MG tablet Take 2 tablets (200 mg total) by mouth at bedtime.  . [DISCONTINUED] azithromycin (ZITHROMAX Z-PAK) 250 MG tablet Take two right away Then one a day for the next 4 days.  . [DISCONTINUED] hydrOXYzine (VISTARIL) 50 MG capsule Take 1 capsule (50 mg total) by mouth 3 (three) times daily as neededf or anxiety.  . [DISCONTINUED] paliperidone (INVEGA) 3 MG 24 hr tablet TAKE ONE TABLET AT BEDTIME  . [DISCONTINUED] traZODone (DESYREL) 100 MG tablet TAKE 3 TABLETS AT BEDTIME    Past Psychiatric History/Hospitalization(s): Anxiety: No Bipolar Disorder: No Depression: No Mania: No Psychosis: Yes Schizophrenia: Yes Personality Disorder: No Hospitalization for psychiatric illness: No History of Electroconvulsive Shock Therapy: No Prior Suicide Attempts: No  Physical Exam: Constitutional:  BP 113/63 mmHg  Pulse 67  Ht 5\' 1"  (1.549 m)  Wt 241 lb 9.6 oz (109.589 kg)  BMI 45.67 kg/m2  General Appearance: alert, oriented, no acute distress and obese  Musculoskeletal: Strength & Muscle Tone: within normal limits Gait & Station: broad based Patient leans: N/A  Psychiatric: Speech (describe rate, volume, coherence, spontaneity, and abnormalities if any):  Slow in rate but normal in volume and spontaneous  Thought Process (describe rate, content, abstract reasoning, and computation):  concrete  Associations: Relevant  Thoughts: delusions and hallucinations  Mental Status: Orientation: oriented to person and place Mood & Affect: Appears tired Attention Span & Concentration:  OK Cognition patient is  intellectually limited Recent and remote memories are appropriate for patient's intellectual ability Insight and judgment seems to fluctuate between fair to poor as patient is intellectually limited Fund of knowledge and language: Poor  Medical Decision Making (Choose Three): Established Problem, Stable/Improving (1), Review of Psycho-Social Stressors (1), Review of Last Therapy Session (1), Review of Medication Regimen & Side Effects (2) and Review of New Medication or Change in Dosage (2)  Assessment: Axis I: schizo affective disorder, dementia  Axis II:  deferred  Axis [name]:  Down syndrome, GERD, status post pneumonia, hydrocephalus  Axis IV:  moderate  Axis V: 50   Plan:  Schizophrenia : Continue Invega 3 mg one at night for psychosis Insomnia: Decrease trazodone to 200 mg at bedtime for sleep. Continue melatonin 10 mg at bedtime for sleep Change Vistaril to 50 mg once when necessary daily for anxiety/agitation and 100 mg at bedtime to help with sleep.  GERD: Continue diet modification along with Prilosec to help with patient's GERD Obstructive sleep apnea: Use CPAP daily at night for sleep apnea. Mother reports that patient's alert during the day because of that Dementia: Continue Aricept 10 mg daily for dementia Aspiration pneumonia: Continue to take the medications with yogurt  to prevent aspiration Down's syndrome: Mom is providing some of the C[employer] Services for patient  Continue to attend adult day center Call when necessary Followup in 6 months This followup visit was of moderate medical complexity as patient has multiple medical issues. Discussed the need with mom to have patient's labs done through PCP as patient is on Janeece Agee, MD

## 2014-09-12 ENCOUNTER — Other Ambulatory Visit: Payer: Self-pay

## 2014-09-12 ENCOUNTER — Encounter (HOSPITAL_COMMUNITY): Payer: Self-pay | Admitting: Psychiatry

## 2014-09-12 DIAGNOSIS — Q909 Down syndrome, unspecified: Secondary | ICD-10-CM

## 2014-09-12 MED ORDER — DONEPEZIL HCL 10 MG PO TABS: ORAL_TABLET | ORAL | Status: DC

## 2014-09-13 ENCOUNTER — Encounter: Payer: Self-pay | Admitting: Pediatrics

## 2014-10-07 ENCOUNTER — Other Ambulatory Visit: Payer: Self-pay

## 2014-10-07 DIAGNOSIS — Q909 Down syndrome, unspecified: Secondary | ICD-10-CM

## 2014-10-07 MED ORDER — DONEPEZIL HCL 10 MG PO TABS: ORAL_TABLET | ORAL | Status: DC

## 2014-11-10 ENCOUNTER — Other Ambulatory Visit: Payer: Self-pay | Admitting: Family

## 2014-11-10 ENCOUNTER — Encounter: Payer: Self-pay | Admitting: Family

## 2014-12-06 ENCOUNTER — Other Ambulatory Visit: Payer: Self-pay | Admitting: Family

## 2014-12-13 ENCOUNTER — Ambulatory Visit (INDEPENDENT_AMBULATORY_CARE_PROVIDER_SITE_OTHER): Payer: Medicare Other

## 2014-12-13 ENCOUNTER — Ambulatory Visit (INDEPENDENT_AMBULATORY_CARE_PROVIDER_SITE_OTHER): Payer: Medicare Other | Admitting: Family

## 2014-12-13 ENCOUNTER — Encounter: Payer: Self-pay | Admitting: Family

## 2014-12-13 VITALS — BP 120/86 | HR 91 | Temp 97.7°F | Ht 61.0 in | Wt 238.0 lb

## 2014-12-13 DIAGNOSIS — R0602 Shortness of breath: Secondary | ICD-10-CM | POA: Diagnosis not present

## 2014-12-13 DIAGNOSIS — H6691 Otitis media, unspecified, right ear: Secondary | ICD-10-CM

## 2014-12-13 MED ORDER — AZITHROMYCIN 250 MG PO TABS: ORAL_TABLET | ORAL | Status: DC

## 2014-12-13 NOTE — Patient Instructions (Signed)

## 2014-12-13 NOTE — Progress Notes (Signed)
   Subjective:    Patient ID: Sergio [name], male    DOB: [birth year], 29 y.o.   MRN: [id number]  Pt has down syndrome and can not report clear symptoms. Mother states the pt never complains of anything. Mother states she was washing his hair this morning and "green discharge" came out of his ear. Mother states he is prone to MRSA in his ear and wants a culture. Mother states pt has had cough that started over the weekend that is unchanged.  Cough Associated symptoms include shortness of breath.  Shortness of Breath This is a new problem. The current episode started in the past 7 days. The problem occurs constantly.      Review of Systems  Constitutional: Negative.   HENT: Negative.   Respiratory: Positive for cough and shortness of breath.   Cardiovascular: Negative.   Gastrointestinal: Negative.   Endocrine: Negative.   Genitourinary: Negative.   Musculoskeletal: Negative.   Neurological: Negative.   Hematological: Negative.   Psychiatric/Behavioral: Negative.   All other systems reviewed and are negative.      Objective:   Physical Exam  Constitutional: He is oriented to person, place, and time. He appears well-developed and well-nourished. No distress.  HENT:  Head: Normocephalic.  Right Ear: There is drainage (greensish) and swelling. A middle ear effusion is present.  Left Ear: External ear normal.  Mouth/Throat: Oropharynx is clear and moist.  Eyes: Pupils are equal, round, and reactive to light. Right eye exhibits no discharge. Left eye exhibits no discharge.  Neck: Normal range of motion. Neck supple. No thyromegaly present.  Cardiovascular: Normal rate, regular rhythm, normal heart sounds and intact distal pulses.   No murmur heard. Pulmonary/Chest: Effort normal and breath sounds normal. No respiratory distress. He has no wheezes.  Abdominal: Soft. Bowel sounds are normal. He exhibits no distension. There is no tenderness.  Musculoskeletal: Normal range of  motion. He exhibits no edema or tenderness.  Neurological: He is alert and oriented to person, place, and time. He has normal reflexes. No cranial nerve deficit.  Skin: Skin is warm and dry. No rash noted. No erythema. There is pallor.  Psychiatric: He has a normal mood and affect. His behavior is normal. Judgment and thought content normal.  Vitals reviewed.   BP 120/86 mmHg  Pulse 91  Temp(Src) 97.7 F (36.5 C) (Oral)  Ht 5\' 1"  (1.549 m)  Wt 238 lb (107.956 kg)  BMI 44.99 kg/m2  SpO2 89%  Chest X-ray- Chronic lung disease Preliminary reading by Evelina Dun, FNP [facility]      Assessment & Plan:  1. Shortness of breath - DG Chest 2 View; Future -Continue coughing and deep breathing  2. Acute right otitis media, recurrence not specified, unspecified otitis media type -Tylenol prn for pain -Keep ears clean and dry -RTO in 1 week - Anaerobic and Aerobic Culture - azithromycin (ZITHROMAX) 250 MG tablet; Take 500 mg once, then 250 mg for four days  Dispense: 6 tablet; Refill: 0  Evelina Dun, FNP

## 2014-12-18 LAB — ANAEROBIC AND AEROBIC CULTURE

## 2014-12-19 ENCOUNTER — Other Ambulatory Visit: Payer: Self-pay | Admitting: Family

## 2014-12-19 MED ORDER — SULFAMETHOXAZOLE-TRIMETHOPRIM 800-160 MG PO TABS: 1.0000 | ORAL_TABLET | Freq: Two times a day (BID) | ORAL | Status: DC

## 2014-12-20 ENCOUNTER — Ambulatory Visit (INDEPENDENT_AMBULATORY_CARE_PROVIDER_SITE_OTHER): Payer: Medicare Other | Admitting: Family

## 2014-12-20 ENCOUNTER — Encounter: Payer: Self-pay | Admitting: Family

## 2014-12-20 VITALS — BP 127/91 | HR 65 | Temp 97.7°F | Ht 61.0 in | Wt 241.2 lb

## 2014-12-20 DIAGNOSIS — H60391 Other infective otitis externa, right ear: Secondary | ICD-10-CM | POA: Diagnosis not present

## 2014-12-20 MED ORDER — CIPROFLOXACIN-DEXAMETHASONE 0.3-0.1 % OT SUSP: 4.0000 [drp] | Freq: Two times a day (BID) | OTIC | Status: DC

## 2014-12-20 NOTE — Progress Notes (Signed)
   Subjective:    Patient ID: Sergio [name], male    DOB: 23-Jun-1985, 29 y.o.   MRN: [id number]  Shortness of Breath Associated symptoms include ear pain. Pertinent negatives include no sore throat.  Otalgia  There is pain in the right ear. This is a recurrent problem. The current episode started 1 to 4 weeks ago. The problem occurs constantly. The problem has been unchanged. There has been no fever. The pain is mild. Associated symptoms include coughing and ear discharge. Pertinent negatives include no hearing loss or sore throat. The treatment provided no relief.      Review of Systems  Constitutional: Negative.   HENT: Positive for ear discharge and ear pain. Negative for hearing loss and sore throat.   Respiratory: Positive for cough and shortness of breath.   Cardiovascular: Negative.   Gastrointestinal: Negative.   Endocrine: Negative.   Genitourinary: Negative.   Musculoskeletal: Negative.   Neurological: Negative.   Hematological: Negative.   Psychiatric/Behavioral: Negative.   All other systems reviewed and are negative.      Objective:   Physical Exam  Constitutional: He is oriented to person, place, and time. He appears well-developed and well-nourished. No distress.  HENT:  Head: Normocephalic.  Right Ear: There is drainage, swelling and tenderness. A middle ear effusion is present.  Left Ear: External ear normal.  Mouth/Throat: Oropharynx is clear and moist.  Eyes: Pupils are equal, round, and reactive to light. Right eye exhibits no discharge. Left eye exhibits no discharge.  Neck: Normal range of motion. Neck supple. No thyromegaly present.  Cardiovascular: Normal rate, regular rhythm, normal heart sounds and intact distal pulses.   No murmur heard. Pulmonary/Chest: Effort normal and breath sounds normal. No respiratory distress. He has no wheezes.  Abdominal: Soft. Bowel sounds are normal. He exhibits no distension. There is no tenderness.    Musculoskeletal: Normal range of motion. He exhibits no edema or tenderness.  Neurological: He is alert and oriented to person, place, and time. He has normal reflexes. No cranial nerve deficit.  Skin: Skin is warm and dry. No rash noted. No erythema.  Psychiatric: He has a normal mood and affect. His behavior is normal. Judgment and thought content normal.  Vitals reviewed.     BP 127/91 mmHg  Pulse 65  Temp(Src) 97.7 F (36.5 C) (Oral)  Ht 5\' 1"  (1.549 m)  Wt 241 lb 3.2 oz (109.408 kg)  BMI 45.60 kg/m2  SpO2 93%     Assessment & Plan:  1. Otitis, externa, infective, right -Do not pick at ear -Keep clean and dry -Continue Bactrim and start Ciprodex drops -RTO 10 days - ciprofloxacin-dexamethasone (CIPRODEX) otic suspension; Place 4 drops into the right ear 2 (two) times daily.  Dispense: 7.5 mL; Refill: 0  Evelina Dun, FNP

## 2014-12-20 NOTE — Patient Instructions (Signed)

## 2014-12-21 ENCOUNTER — Telehealth: Payer: Self-pay | Admitting: Family

## 2014-12-21 MED ORDER — NEOMYCIN-POLYMYXIN-HC 3.5-10000-1 OT SOLN: 3.0000 [drp] | Freq: Four times a day (QID) | OTIC | Status: DC

## 2014-12-21 NOTE — Telephone Encounter (Signed)
Ok to change per Dr Livia Snellen

## 2014-12-22 ENCOUNTER — Telehealth: Payer: Self-pay

## 2014-12-22 NOTE — Telephone Encounter (Signed)
insurance prior authorized Ciprodex Otic Suspension until 05/06/15

## 2014-12-29 ENCOUNTER — Encounter: Payer: Self-pay | Admitting: Family

## 2014-12-29 ENCOUNTER — Ambulatory Visit (INDEPENDENT_AMBULATORY_CARE_PROVIDER_SITE_OTHER): Payer: Medicare Other | Admitting: Family

## 2014-12-29 VITALS — BP 115/77 | HR 59 | Temp 97.2°F | Ht 61.0 in | Wt 243.0 lb

## 2014-12-29 DIAGNOSIS — H6691 Otitis media, unspecified, right ear: Secondary | ICD-10-CM

## 2014-12-29 NOTE — Patient Instructions (Signed)

## 2014-12-29 NOTE — Progress Notes (Signed)
   Subjective:    Patient ID: Sergio [name], male    DOB: Jan 10, 1986, 29 y.o.   MRN: [id number]   HPI Pt presents to the office to have his right ear recheck. Mother states there has not noticed any more drainage out of ear. Pt has down syndrome and does not complain of pain per mother.Pt's ear grew citrobacter koseri. Pt was given ciprodex for 10days and given 5 days of bactrium. Mother states she has not noticed any new fevers, SOB, or edema.    Review of Systems  Constitutional: Negative.   HENT: Negative.   Respiratory: Negative.   Cardiovascular: Negative.   Gastrointestinal: Negative.   Endocrine: Negative.   Genitourinary: Positive for enuresis. Negative for flank pain.  Musculoskeletal: Negative.   Neurological: Negative.   Hematological: Negative.   Psychiatric/Behavioral: Negative.   All other systems reviewed and are negative.      Objective:   Physical Exam  Constitutional: He is oriented to person, place, and time. He appears well-developed and well-nourished. No distress.  HENT:  Head: Normocephalic.  Right Ear: No drainage. Tympanic membrane is not erythematous (Mild TM swelling).  Left Ear: External ear normal.  Mouth/Throat: Oropharynx is clear and moist.  Eyes: Pupils are equal, round, and reactive to light. Right eye exhibits no discharge. Left eye exhibits no discharge.  Neck: Normal range of motion. Neck supple. No thyromegaly present.  Cardiovascular: Normal rate, regular rhythm, normal heart sounds and intact distal pulses.   No murmur heard. Pulmonary/Chest: Effort normal and breath sounds normal. No respiratory distress. He has no wheezes.  Abdominal: Soft. Bowel sounds are normal. He exhibits no distension. There is no tenderness.  Musculoskeletal: Normal range of motion. He exhibits no edema or tenderness.  Neurological: He is alert and oriented to person, place, and time. He has normal reflexes. No cranial nerve deficit.  Skin: Skin is warm and  dry. No rash noted. No erythema.  Psychiatric: He has a normal mood and affect. His behavior is normal. Judgment and thought content normal.  Vitals reviewed.   BP 115/77 mmHg  Pulse 59  Temp(Src) 97.2 F (36.2 C) (Oral)  Ht 5\' 1"  (1.549 m)  Wt 243 lb (110.224 kg)  BMI 45.94 kg/m2       Assessment & Plan:  1. Acute right otitis media, recurrence not specified, unspecified otitis media type -Ear much improved!! Still a mild effusion , no drainage present -Continue Ciprodex -Keep clean and dry -Tylenol prn for pain -RTO in 6 months    Evelina Dun, FNP

## 2014-12-30 ENCOUNTER — Ambulatory Visit: Payer: Medicare Other | Admitting: Family

## 2015-01-10 ENCOUNTER — Ambulatory Visit (INDEPENDENT_AMBULATORY_CARE_PROVIDER_SITE_OTHER): Payer: Medicare Other | Admitting: Family Medicine

## 2015-01-10 ENCOUNTER — Encounter: Payer: Self-pay | Admitting: Family Medicine

## 2015-01-10 VITALS — BP 125/68 | HR 82 | Temp 98.4°F | Ht 61.0 in | Wt 242.0 lb

## 2015-01-10 DIAGNOSIS — H66005 Acute suppurative otitis media without spontaneous rupture of ear drum, recurrent, left ear: Secondary | ICD-10-CM

## 2015-01-10 DIAGNOSIS — H66009 Acute suppurative otitis media without spontaneous rupture of ear drum, unspecified ear: Secondary | ICD-10-CM | POA: Insufficient documentation

## 2015-01-10 MED ORDER — AZITHROMYCIN 250 MG PO TABS: ORAL_TABLET | ORAL | Status: DC

## 2015-01-10 NOTE — Progress Notes (Signed)
   HPI  Patient presents today for concern of pneumonia  His father explains that for the last 3 days he's had runny nose, cough, nasal congestion, dyspnea, and slightly increased work of breathing. He is using nebulizer at home which is helping his breathing some. He states these had recurrent pneumonia and that this is how it usually starts.  He's also been battling an ear infection over the last month. He has completed a course of Bactrim and been using Ciprodex drops. He has left ear pain today.  He also complains of central chest pain with cough.  He has Down syndrome so his father has been his primary history source.  Father also reports these had an elevated temperature at 99 from his usual 97.5  PMH: Smoking status noted ROS: Per HPI  Objective: BP 125/68 mmHg  Pulse 82  Temp(Src) 98.4 F (36.9 C) (Oral)  Ht 5\' 1"  (1.549 m)  Wt 242 lb (109.77 kg)  BMI 45.75 kg/m2 Gen: NAD, alert, cooperative with exam HEENT: NCAT, left TM erythematous and loss of landmarks, right TM normal, oropharynx clear CV: RRR, good S1/S2, no murmur Resp: CTABL, no wheezes, non-labored Abd: SNTND, BS present, no guarding or organomegaly Ext: No edema, warm Neuro: Alert and oriented, No gross deficits  Assessment and plan:  # Acute suppurative otitis media Treat with azithromycin as he is Arty finished Bactrim recently and he is penicillin allergic May need referral to ENT if this is not resolved.   # URI Likely viral URI, however understand this may develop into pneumonia, covering with azithromycin for ear infection, this will also help if he is developing pneumonia. Reasons for return reviewed in detail   Meds ordered this encounter  Medications  . azithromycin (ZITHROMAX Z-PAK) 250 MG tablet    Sig: As directed    Dispense:  1 each    Refill:  [place], MD [facility] 01/10/2015, 2:23 PM

## 2015-01-10 NOTE — Patient Instructions (Addendum)
Great to meet you!  If he does not get better please bring him back. IOf his ear doesn't resolve we will need to send him to ENT  If he develops more trouble breathing, fevers, or is unable to eat and drink please bring him back.

## 2015-01-13 ENCOUNTER — Ambulatory Visit (INDEPENDENT_AMBULATORY_CARE_PROVIDER_SITE_OTHER): Payer: Medicare Other | Admitting: Family Medicine

## 2015-01-13 ENCOUNTER — Encounter: Payer: Self-pay | Admitting: Family Medicine

## 2015-01-13 VITALS — BP 109/67 | HR 53 | Temp 97.9°F | Ht 61.0 in | Wt 243.0 lb

## 2015-01-13 DIAGNOSIS — J40 Bronchitis, not specified as acute or chronic: Secondary | ICD-10-CM

## 2015-01-13 DIAGNOSIS — J069 Acute upper respiratory infection, unspecified: Secondary | ICD-10-CM | POA: Diagnosis not present

## 2015-01-13 DIAGNOSIS — J209 Acute bronchitis, unspecified: Secondary | ICD-10-CM

## 2015-01-13 NOTE — Progress Notes (Signed)
Subjective:    Patient ID: Sergio [name], male    DOB: 08-Dec-1985, 29 y.o.   MRN: [id number]  HPI Patient here today for cough, congestion and SOB. He is accompanied today by his parents. The patient was treated recently, 3 days ago with azithromycin for an ear infection and chest congestion. He is very sensitive and has a lot of respiratory issues. Parents were concerned and brought him back in for recheck today. He had a chest x-ray on August 9 of this year and it showed chronic interstitial lung disease with no acute cardiopulmonary issues going on at the time. He continues to have a lot of yellow-green drainage with coughing and sneezing.      Patient Active Problem List   Diagnosis Date Noted  . Acute suppurative otitis media 01/10/2015  . Trisomy 21 12/08/2013  . Congenital hydrocephalus 12/08/2013  . Need for prophylactic vaccination and inoculation against influenza 05/11/2013  . Right otitis externa 05/11/2013  . Tinea pedis 05/11/2013  . Severe obesity (BMI >= 40) 04/05/2013  . Unspecified vitamin D deficiency 01/23/2013  . OSA (obstructive sleep apnea)   . Pituitary adenoma   . Recurrent aspiration pneumonia 09/23/2012  . Hypersomnia with sleep apnea, unspecified 09/23/2012  . Ingrown nail 09/23/2012  . Unspecified asthma(493.90) 09/23/2012  . Anterior pituitary hormones causing adverse effect in therapeutic use 09/02/2012  . Headache(784.0) 09/02/2012  . 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 Dementia in conditions classified elsewhere with behavioral disturbance 09/02/2012  . Shortness of breath 07/05/2012  . Community acquired pneumonia 07/02/2012  . Pneumonia 07/06/2011  . Fever 07/06/2011  . GERD (gastroesophageal reflux disease) 07/06/2011  . Schizoaffective disorder    Outpatient Encounter Prescriptions as of 01/13/2015  Medication Sig  . azithromycin (ZITHROMAX Z-PAK) 250 MG tablet As directed  .  cholecalciferol (VITAMIN D) 1000 UNITS tablet Take 6,000 Units by mouth daily.   Marland Kitchen donepezil (ARICEPT) 10 MG tablet TAKE ONE TABLET AT BEDTIME  . hydrOXYzine (VISTARIL) 50 MG capsule Take 1 capsule (50 mg total) by mouth 3 (three) times daily as neededf or anxiety.  Marland Kitchen ipratropium-albuterol (DUONEB) 0.5-2.5 (3) MG/3ML SOLN Take 3 mLs by nebulization every 6 (six) hours as needed. Dx 493.90  . Melatonin 10 MG TABS Take 1 tablet by mouth at bedtime.  Marland Kitchen neomycin-polymyxin-hydrocortisone (CORTISPORIN) otic solution Place 3 drops into the right ear 4 (four) times daily.  . paliperidone (INVEGA) 3 MG 24 hr tablet Take 1 tablet (3 mg total) by mouth at bedtime.  . traZODone (DESYREL) 100 MG tablet Take 2 tablets (200 mg total) by mouth at bedtime.  . [DISCONTINUED] ciprofloxacin-dexamethasone (CIPRODEX) otic suspension Place 4 drops into the right ear 2 (two) times daily.   No facility-administered encounter medications on file as of 01/13/2015.      Review of Systems  Constitutional: Negative.   HENT: Positive for congestion.   Eyes: Negative.   Respiratory: Positive for cough and shortness of breath.   Cardiovascular: Negative.   Gastrointestinal: Negative.   Endocrine: Negative.   Genitourinary: Negative.   Musculoskeletal: Negative.   Skin: Negative.   Allergic/Immunologic: Negative.   Neurological: Negative.   Hematological: Negative.   Psychiatric/Behavioral: Negative.        Objective:   Physical Exam  Constitutional: He appears well-developed and well-nourished. No distress.  HENT:  Head: Normocephalic and atraumatic.  Right Ear: External ear normal.  Left Ear: External ear normal.  The mouth and throat is somewhat  red and dried out. The TMs were normal. There is no anterior cervical adenopathy. There was nasal pallor with minimal drainage.  Eyes: Conjunctivae and EOM are normal. Pupils are equal, round, and reactive to light. Right eye exhibits no discharge. Left eye exhibits  no discharge.  Neck: Normal range of motion. Neck supple. No thyromegaly present.  Cardiovascular: Normal rate, regular rhythm and normal heart sounds.  Exam reveals no friction rub.   No murmur heard. Pulmonary/Chest: Effort normal and breath sounds normal. No respiratory distress. He has no wheezes. He has no rales.  The patient has a dry cough.  Musculoskeletal: Normal range of motion.  Neurological: He is alert.  Skin: Skin is warm and dry. No rash noted.  Psychiatric: He has a normal mood and affect. His behavior is normal. Thought content normal.  Nursing note and vitals reviewed.  BP 109/67 mmHg  Pulse 53  Temp(Src) 97.9 F (36.6 C) (Oral)  Ht '5\' 1"'  (1.549 m)  Wt 243 lb (110.224 kg)  BMI 45.94 kg/m2  SpO2 92%        Assessment & Plan:  1. Bronchitis with bronchospasm -Drink plenty of fluids -Take Tylenol as needed for pain -Take Mucinex maximum strength 1 twice daily for cough and congestion with a large glass of water -Use nasal saline frequently during the day -Finish the Z-Pak - CBC with Differential/Platelet - BMP8+EGFR  2. URI (upper respiratory infection) -Continue with above recommendations -Call in the morning for results of CBC and BMP  Patient Instructions  Drink plenty of fluids Cover temperature or fever with Tylenol every or to 6 hours Use nasal saline frequently each nostril during the day Take Mucinex maximum strength, blue and white in color, 1 twice daily with a large glass of water Continue to use DuoNeb every 8-12 hours Continue and complete the Z-Pak Call in the morning for the results of the CBC that is done today   Arrie Senate MD

## 2015-01-13 NOTE — Patient Instructions (Signed)
Drink plenty of fluids Cover temperature or fever with Tylenol every or to 6 hours Use nasal saline frequently each nostril during the day Take Mucinex maximum strength, blue and white in color, 1 twice daily with a large glass of water Continue to use DuoNeb every 8-12 hours Continue and complete the Z-Pak Call in the morning for the results of the CBC that is done today

## 2015-01-14 LAB — CBC WITH DIFFERENTIAL/PLATELET
BASOS ABS: 0.1 10*3/uL (ref 0.0–0.2)
Basos: 1 %
EOS (ABSOLUTE): 0.1 10*3/uL (ref 0.0–0.4)
Eos: 2 %
Hematocrit: 43.9 % (ref 37.5–51.0)
Hemoglobin: 13.8 g/dL (ref 12.6–17.7)
Immature Grans (Abs): 0 10*3/uL (ref 0.0–0.1)
Immature Granulocytes: 0 %
LYMPHS ABS: 2.8 10*3/uL (ref 0.7–3.1)
Lymphs: 32 %
MCH: 29.6 pg (ref 26.6–33.0)
MCHC: 31.4 g/dL — AB (ref 31.5–35.7)
MCV: 94 fL (ref 79–97)
MONOS ABS: 0.6 10*3/uL (ref 0.1–0.9)
Monocytes: 7 %
NEUTROS ABS: 5 10*3/uL (ref 1.4–7.0)
Neutrophils: 58 %
PLATELETS: 274 10*3/uL (ref 150–379)
RBC: 4.66 x10E6/uL (ref 4.14–5.80)
RDW: 14.6 % (ref 12.3–15.4)
WBC: 8.6 10*3/uL (ref 3.4–10.8)

## 2015-01-14 LAB — BMP8+EGFR
BUN / CREAT RATIO: 18 (ref 8–19)
BUN: 19 mg/dL (ref 6–20)
CHLORIDE: 97 mmol/L (ref 97–108)
CO2: 27 mmol/L (ref 18–29)
CREATININE: 1.08 mg/dL (ref 0.76–1.27)
Calcium: 9 mg/dL (ref 8.7–10.2)
GFR calc Af Amer: 107 mL/min/{1.73_m2} (ref >59)
GFR calc non Af Amer: 93 mL/min/{1.73_m2} (ref >59)
GLUCOSE: 94 mg/dL (ref 65–99)
Potassium: 4.7 mmol/L (ref 3.5–5.2)
SODIUM: 138 mmol/L (ref 134–144)

## 2015-01-26 ENCOUNTER — Other Ambulatory Visit: Payer: Self-pay | Admitting: *Deleted

## 2015-01-26 NOTE — Telephone Encounter (Signed)
Mom came by - sample spacer given

## 2015-01-26 NOTE — Telephone Encounter (Signed)
Lmtcb that we have a spacer here that he can have  (in MMM's office)

## 2015-03-02 ENCOUNTER — Encounter (HOSPITAL_COMMUNITY): Payer: Self-pay | Admitting: Psychiatry

## 2015-03-02 ENCOUNTER — Ambulatory Visit (INDEPENDENT_AMBULATORY_CARE_PROVIDER_SITE_OTHER): Payer: Medicare Other | Admitting: Psychiatry

## 2015-03-02 VITALS — BP 113/74 | HR 103 | Ht 60.0 in | Wt 249.6 lb

## 2015-03-02 DIAGNOSIS — F0390 Unspecified dementia without behavioral disturbance: Secondary | ICD-10-CM | POA: Diagnosis not present

## 2015-03-02 DIAGNOSIS — F209 Schizophrenia, unspecified: Secondary | ICD-10-CM | POA: Diagnosis not present

## 2015-03-02 MED ORDER — TRAZODONE HCL 100 MG PO TABS: 200.0000 mg | ORAL_TABLET | Freq: Every day | ORAL | Status: DC

## 2015-03-02 MED ORDER — HYDROXYZINE PAMOATE 50 MG PO CAPS: ORAL_CAPSULE | ORAL | Status: DC

## 2015-03-02 MED ORDER — PALIPERIDONE ER 3 MG PO TB24: 3.0000 mg | ORAL_TABLET | Freq: Every day | ORAL | Status: DC

## 2015-03-02 NOTE — Progress Notes (Signed)
Patient ID: Sergio [name], male   DOB: June 23, 1985, 29 y.o.   MRN: [id number]  [place] Progress Note  Sergio [name] [id number] 29 y.o.  Date of visit : 03/02/2015 Chief Complaint: I have a birthday party tomorrow and my sister and nieces are going to attend  History of Present Illness: Patient is a 29 year old diagnosed with schizoaffective disorder, Down syndrome, GERD and hydrocephalus who presents today for a followup visit.  Dad reports that the patient seems to be doing fairly okay, adds that he wakes up 2 or 3 times at night, has to be told to go back to bed. Dad states the patient also continues to struggle with his breathing, adds that he really does not exercise. He states that patient gets breathing treatment at least twice a day. Discussed with patient the need to walk daily for at least 30 minutes  Patient states that he likes going to the [employer], goes 3 days a week. Dad agrees that it's a good change for patient.  In regards to this hallucinations,  dad states that they do not bother the patient, adds that they help patient keep himself occupied. Dad has that patient states that he has multiple girlfriends.  He denies any side effects of the medications, any other complaints, any safety issues at this visit   Suicidal Ideation: No Plan Formed: No Patient has means to carry out plan: No  Homicidal Ideation: No Plan Formed: No Patient has means to carry out plan: No  Review of Systems  Constitutional: Positive for malaise/fatigue. Negative for fever, chills and weight loss.  HENT: Negative for congestion, ear discharge, hearing loss and sore throat.   Eyes: Negative for blurred vision, double vision, discharge and redness.  Respiratory: Positive for shortness of breath and wheezing. Negative for cough, hemoptysis and sputum production.   Cardiovascular: Negative.  Negative for chest pain and palpitations.  Gastrointestinal: Negative.   Negative for heartburn, nausea, vomiting, abdominal pain, diarrhea and constipation.  Genitourinary: Negative.  Negative for dysuria.  Musculoskeletal: Negative.  Negative for myalgias and falls.  Skin: Negative.  Negative for itching and rash.  Neurological: Negative.  Negative for dizziness, focal weakness, seizures, loss of consciousness and headaches.  Endo/Heme/Allergies: Negative.  Negative for environmental allergies.  Psychiatric/Behavioral: Positive for hallucinations. Negative for depression, suicidal ideas, memory loss and substance abuse. The patient is not nervous/anxious and does not have insomnia.     Past Medical Family, Social History:  Lives with parents in [place], patient attends the adult enrichment  center 3 times a [facility] Past Medical History  Diagnosis Date  . Down's syndrome   . GERD (gastroesophageal reflux disease)   . Hydrocephalus   . Schizoaffective disorder   . Dementia   . Memory loss   . Headache(784.0)   . Difficulty swallowing   . Weakness   . Insomnia   . Dysphagia causing pulmonary aspiration with swallowing   . OSA (obstructive sleep apnea)   . Pituitary adenoma ([place])    Family History  Problem Relation Age of Onset  . Hypertension Mother   . Renal Disease Father   . Emphysema Maternal Grandfather     Died at 16    Outpatient Encounter Prescriptions as of 03/02/2015  Medication Sig  . azithromycin (ZITHROMAX Z-PAK) 250 MG tablet As directed  . cholecalciferol (VITAMIN D) 1000 UNITS tablet Take 6,000 Units by mouth daily.   Marland Kitchen donepezil (ARICEPT) 10 MG tablet TAKE ONE TABLET  AT BEDTIME  . hydrOXYzine (VISTARIL) 50 MG capsule Take 1 capsule (50 mg total) by mouth 3 (three) times daily as neededf or anxiety.  Marland Kitchen ipratropium-albuterol (DUONEB) 0.5-2.5 (3) MG/3ML SOLN Take 3 mLs by nebulization every 6 (six) hours as needed. Dx 493.90  . Melatonin 10 MG TABS Take 1 tablet by mouth at bedtime.  Marland Kitchen neomycin-polymyxin-hydrocortisone  (CORTISPORIN) otic solution Place 3 drops into the right ear 4 (four) times daily.  . paliperidone (INVEGA) 3 MG 24 hr tablet Take 1 tablet (3 mg total) by mouth at bedtime.  . traZODone (DESYREL) 100 MG tablet Take 2 tablets (200 mg total) by mouth at bedtime.   No facility-administered encounter medications on file as of 03/02/2015.    Past Psychiatric History/Hospitalization(s): Anxiety: No Bipolar Disorder: No Depression: No Mania: No Psychosis: Yes Schizophrenia: Yes Personality Disorder: No Hospitalization for psychiatric illness: No History of Electroconvulsive Shock Therapy: No Prior Suicide Attempts: No  Physical Exam: Constitutional:  BP 113/74 mmHg  Pulse 103  Ht 5' (1.524 m)  Wt 249 lb 9.6 oz (113.218 kg)  BMI 48.75 kg/m2  General Appearance: alert, oriented, no acute distress and obese  Musculoskeletal: Strength & Muscle Tone: within normal limits Gait & Station: broad based Patient leans: N/A  Psychiatric: Speech (describe rate, volume, coherence, spontaneity, and abnormalities if any):  Slow in rate but normal in volume and spontaneous  Thought Process (describe rate, content, abstract reasoning, and computation):  concrete  Associations: Relevant  Thoughts: delusions and hallucinations  Mental Status: Orientation: oriented to person and place Mood & Affect: Appears tired Attention Span & Concentration:  OK Cognition patient is intellectually limited Recent and remote memories are appropriate for patient's intellectual ability Insight and judgment seems to fluctuate between fair to poor as patient is intellectually limited Fund of knowledge and language: Poor  Medical Decision Making (Choose Three): Established Problem, Stable/Improving (1), Review of Psycho-Social Stressors (1), Review of Last Therapy Session (1), Review of Medication Regimen & Side Effects (2) and Review of New Medication or Change in Dosage (2)  Assessment: Axis I: schizo  affective disorder, dementia  Axis II:  deferred  Axis [name]:  Down syndrome, GERD, status post pneumonia, hydrocephalus  Axis IV:  moderate  Axis V: 50   Plan:  Schizophrenia : Continue Invega 3 mg one at night for psychosis Insomnia:  Continue trazodone 200 mg at bedtime for sleep. Continue melatonin 10 mg at bedtime for sleep Change Vistaril to 50 mg once when necessary daily for anxiety/agitation and 100 mg at bedtime to help with sleep.  GERD: Continue diet modification along with Prilosec to help with patient's GERD Obstructive sleep apnea: Use CPAP daily at night for sleep apnea.  Dementia: Continue Aricept 10 mg daily for dementia Aspiration pneumonia: Continue to take the medications with yogurt to prevent aspiration Continue breathing treatments as patient has wheezing Down's syndrome: Mom is providing some of the [facility] Services for patient  Continue to attend adult enrichment center Call when necessary Followup in 6 months This followup visit was of moderate medical complexity as patient has multiple medical issues. Discussed that the lab work done in September which includes a CBC with differential count a chemistry profile are within normal limits, patient's glucose was 94 Hampton Abbot, MD

## 2015-03-20 ENCOUNTER — Encounter (INDEPENDENT_AMBULATORY_CARE_PROVIDER_SITE_OTHER): Payer: Self-pay

## 2015-03-20 ENCOUNTER — Ambulatory Visit (INDEPENDENT_AMBULATORY_CARE_PROVIDER_SITE_OTHER): Payer: Medicare Other | Admitting: Family Medicine

## 2015-03-20 ENCOUNTER — Ambulatory Visit (INDEPENDENT_AMBULATORY_CARE_PROVIDER_SITE_OTHER): Payer: Medicare Other

## 2015-03-20 ENCOUNTER — Encounter: Payer: Self-pay | Admitting: Family Medicine

## 2015-03-20 VITALS — BP 130/76 | HR 112 | Temp 98.6°F | Ht 60.0 in | Wt 249.0 lb

## 2015-03-20 DIAGNOSIS — J4521 Mild intermittent asthma with (acute) exacerbation: Secondary | ICD-10-CM

## 2015-03-20 DIAGNOSIS — R0989 Other specified symptoms and signs involving the circulatory and respiratory systems: Secondary | ICD-10-CM

## 2015-03-20 MED ORDER — IPRATROPIUM-ALBUTEROL 0.5-2.5 (3) MG/3ML IN SOLN: 3.0000 mL | Freq: Four times a day (QID) | RESPIRATORY_TRACT | Status: DC | PRN

## 2015-03-20 MED ORDER — BETAMETHASONE SOD PHOS & ACET 6 (3-3) MG/ML IJ SUSP
6.0000 mg | Freq: Once | INTRAMUSCULAR | Status: AC
  Administered 2015-03-20: 6 mg via INTRAMUSCULAR

## 2015-03-20 MED ORDER — LEVOFLOXACIN 500 MG PO TABS: 500.0000 mg | ORAL_TABLET | Freq: Every day | ORAL | Status: DC

## 2015-03-20 NOTE — Progress Notes (Signed)
Subjective:  Patient ID: Sergio [name], male    DOB: 03/18/1986  Age: 29 y.o. MRN: [id number]  CC: URI   HPI Sergio [name] presents for Patient presents with upper respiratory congestion. Rhinorrhea that is frequently purulent. There is moderate sore throat. Patient reports coughing frequently as well.-colored/purulent sputum noted. There is no fever no chills no sweats. The patient denies being short of breath. Onset was 2 days ago. Gradually worsening. Tried Delsym for cough without improvement. Pt. Cannot cough up sputum state parents. However cough is loose.  History Zekhi has a past medical history of Down's syndrome; GERD (gastroesophageal reflux disease); Hydrocephalus; Schizoaffective disorder; Dementia; Memory loss; Headache(784.0); Difficulty swallowing; Weakness; Insomnia; Dysphagia causing pulmonary aspiration with swallowing; OSA (obstructive sleep apnea); and Pituitary adenoma ([place]).   He has past surgical history that includes CSF shunt; Brain surgery; Ventriculoperitoneal shunt; and Tympanostomy tube placement (Bilateral).   His family history includes Emphysema in his maternal grandfather; Hypertension in his mother; Renal Disease in his father.He reports that he has never smoked. He has never used smokeless tobacco. He reports that he does not drink alcohol or use illicit drugs.  Outpatient Prescriptions Prior to Visit  Medication Sig Dispense Refill  . cholecalciferol (VITAMIN D) 1000 UNITS tablet Take 6,000 Units by mouth daily.     . hydrOXYzine (VISTARIL) 50 MG capsule Take 1 capsule (50 mg total) by mouth 3 (three) times daily as neededf or anxiety. 90 capsule 5  . Melatonin 10 MG TABS Take 1 tablet by mouth at bedtime.    Marland Kitchen neomycin-polymyxin-hydrocortisone (CORTISPORIN) otic solution Place 3 drops into the right ear 4 (four) times daily. 10 mL 0  . traZODone (DESYREL) 100 MG tablet Take 2 tablets (200 mg total) by mouth at bedtime. 60 tablet 5  .  ipratropium-albuterol (DUONEB) 0.5-2.5 (3) MG/3ML SOLN Take 3 mLs by nebulization every 6 (six) hours as needed. Dx 493.90 360 mL 5  . azithromycin (ZITHROMAX Z-PAK) 250 MG tablet As directed (Patient not taking: Reported on 03/20/2015) 1 each 0  . donepezil (ARICEPT) 10 MG tablet TAKE ONE TABLET AT BEDTIME (Patient not taking: Reported on 03/20/2015) 30 tablet 0  . paliperidone (INVEGA) 3 MG 24 hr tablet Take 1 tablet (3 mg total) by mouth at bedtime. (Patient not taking: Reported on 03/20/2015) 30 tablet 5   No facility-administered medications prior to visit.    ROS Review of Systems  Constitutional: Negative for fever, chills, activity change and appetite change.  HENT: Positive for congestion, postnasal drip, rhinorrhea and sinus pressure. Negative for ear discharge, ear pain, hearing loss, nosebleeds, sneezing and trouble swallowing.   Respiratory: Positive for cough. Negative for chest tightness and shortness of breath.   Cardiovascular: Negative for chest pain and palpitations.  Skin: Negative for rash.    Objective:  BP 130/76 mmHg  Pulse 112  Temp(Src) 98.6 F (37 C) (Oral)  Ht 5' (1.524 m)  Wt 249 lb (112.946 kg)  BMI 48.63 kg/m2  SpO2 91%  BP Readings from Last 3 Encounters:  03/20/15 130/76  03/02/15 113/74  01/13/15 109/67    Wt Readings from Last 3 Encounters:  03/20/15 249 lb (112.946 kg)  03/02/15 249 lb 9.6 oz (113.218 kg)  01/13/15 243 lb (110.224 kg)     Physical Exam  Constitutional: He appears well-developed and well-nourished.  HENT:  Head: Normocephalic and atraumatic.  Right Ear: Tympanic membrane and external ear normal. No decreased hearing is noted.  Left Ear: Tympanic membrane  and external ear normal. No decreased hearing is noted.  Nose: Mucosal edema present. Right sinus exhibits no frontal sinus tenderness. Left sinus exhibits no frontal sinus tenderness.  Mouth/Throat: No oropharyngeal exudate or posterior oropharyngeal erythema.  Neck:  No Brudzinski's sign noted.  Pulmonary/Chest: Breath sounds normal. No respiratory distress.  Lymphadenopathy:       Head (right side): No preauricular adenopathy present.       Head (left side): No preauricular adenopathy present.       Right cervical: No superficial cervical adenopathy present.      Left cervical: No superficial cervical adenopathy present.    No results found for: HGBA1C  Lab Results  Component Value Date   WBC 8.6 01/13/2015   HGB 13.8 03/24/2014   HCT 43.9 01/13/2015   PLT 234 03/24/2014   GLUCOSE 94 01/13/2015   ALT 29 03/24/2014   AST 16 03/24/2014   NA 138 01/13/2015   K 4.7 01/13/2015   CL 97 01/13/2015   CREATININE 1.08 01/13/2015   BUN 19 01/13/2015   CO2 27 01/13/2015   TSH 3.020 01/21/2013    Ct Head Wo Contrast  02/11/2013  CLINICAL DATA:  History of hydrocephalus with posterior shunt, right-sided head pain, history of pituitary tumor on prior MRI EXAM: CT HEAD WITHOUT CONTRAST TECHNIQUE: Contiguous axial images were obtained from the base of the skull through the vertex without intravenous contrast. COMPARISON:  MRI 03/14/2010, CT head 06/26/2006 FINDINGS: The ventricles are mildly more prominent when compared to CT scan from 2008 but identical to MRI performed 2011 and are not considered to be dilated. There is a right ventriculoperitoneal shunt with tip into the body of the right lateral ventricle. Known pituitary gland mass not appreciated well by CT. No evidence of vascular territory infarct or extra-axial fluid. Mild beam attenuation artifact creates linear foci of subcortical hyperintensity in the left frontoparietal and right parietal regions. There is mild motion artifact contributing to this as well. There is a tiny focus of cortical calcification just above the position with the right shunt tube crosses the inner table. IMPRESSION: No acute abnormalities. Known pituitary gland mass not appreciated well by CT. Ventricles stable when compared to  2011. Electronically Signed   By: Skipper Cliche M.D.   On: 02/11/2013 12:41    Assessment & Plan:   Yani was seen today for uri.  Diagnoses and all orders for this visit:  Chest congestion -     DG Chest 2 View; Future  Asthma, mild intermittent, with acute exacerbation -     ipratropium-albuterol (DUONEB) 0.5-2.5 (3) MG/3ML SOLN; Take 3 mLs by nebulization every 6 (six) hours as needed. Dx 493.90 -     betamethasone acetate-betamethasone sodium phosphate (CELESTONE) injection 6 mg; Inject 1 mL (6 mg total) into the muscle once.  Other orders -     levofloxacin (LEVAQUIN) 500 MG tablet; Take 1 tablet (500 mg total) by mouth daily.   I have discontinued Mr. Rumbaugh's donepezil, azithromycin, and paliperidone. I am also having him start on levofloxacin. Additionally, I am having him maintain his Melatonin, cholecalciferol, neomycin-polymyxin-hydrocortisone, traZODone, hydrOXYzine, and ipratropium-albuterol. We administered betamethasone acetate-betamethasone sodium phosphate.  Meds ordered this encounter  Medications  . levofloxacin (LEVAQUIN) 500 MG tablet    Sig: Take 1 tablet (500 mg total) by mouth daily.    Dispense:  10 tablet    Refill:  0  . ipratropium-albuterol (DUONEB) 0.5-2.5 (3) MG/3ML SOLN    Sig: Take 3 mLs by nebulization  every 6 (six) hours as needed. Dx 493.90    Dispense:  360 mL    Refill:  5    Diagnosis code 493.90 Maintenance treatment  . betamethasone acetate-betamethasone sodium phosphate (CELESTONE) injection 6 mg    Sig:      Follow-up: Return if symptoms worsen or fail to improve.  Claretta Fraise, M.D.

## 2015-03-21 ENCOUNTER — Telehealth: Payer: Self-pay | Admitting: *Deleted

## 2015-03-21 NOTE — Telephone Encounter (Signed)
Pt's mother notified of results Verbalizing understanding

## 2015-03-21 NOTE — Telephone Encounter (Signed)
-----   Message from Claretta Fraise, MD sent at 03/21/2015 10:53 AM EST ----- The area of bronchitis that I had mentioned yesterday was actually identified as a pneumonia by the radiologist. Due to my suspicions I went ahead and put him on medication that should cover pneumonia anyway. To be on the safe side I should check him again in a few days. If he fails to improve I should see him about Friday. If he seems to be getting better we can wait until early next week. Either way he should have a follow-up chest x-ray as he gets better to make sure that the pneumonia is clearing.

## 2015-04-19 ENCOUNTER — Ambulatory Visit (INDEPENDENT_AMBULATORY_CARE_PROVIDER_SITE_OTHER): Payer: Medicare Other

## 2015-04-19 ENCOUNTER — Ambulatory Visit (INDEPENDENT_AMBULATORY_CARE_PROVIDER_SITE_OTHER): Payer: Medicare Other | Admitting: Pediatrics

## 2015-04-19 ENCOUNTER — Encounter: Payer: Self-pay | Admitting: Pediatrics

## 2015-04-19 VITALS — BP 110/70 | HR 87 | Temp 97.0°F | Ht 60.0 in | Wt 252.8 lb

## 2015-04-19 DIAGNOSIS — R05 Cough: Secondary | ICD-10-CM | POA: Diagnosis not present

## 2015-04-19 DIAGNOSIS — G4733 Obstructive sleep apnea (adult) (pediatric): Secondary | ICD-10-CM

## 2015-04-19 DIAGNOSIS — R059 Cough, unspecified: Secondary | ICD-10-CM

## 2015-04-19 DIAGNOSIS — H6691 Otitis media, unspecified, right ear: Secondary | ICD-10-CM

## 2015-04-19 DIAGNOSIS — R062 Wheezing: Secondary | ICD-10-CM | POA: Diagnosis not present

## 2015-04-19 MED ORDER — PREDNISONE 20 MG PO TABS
ORAL_TABLET | ORAL | Status: DC
Start: 2015-04-19 — End: 2015-05-02

## 2015-04-19 MED ORDER — LEVOFLOXACIN 750 MG PO TABS: 750.0000 mg | ORAL_TABLET | Freq: Every day | ORAL | Status: DC

## 2015-04-19 NOTE — Progress Notes (Signed)
Subjective:    Patient ID: Sergio [name], male    DOB: January 07, 1986, 29 y.o.   MRN: [id number]  CC: Cough; Nasal Congestion; and Shortness of Breath   HPI: Sergio [name] is a 29 y.o. male presenting for Cough; Nasal Congestion; and Shortness of Breath  Has been sick for 3 days Has had breathing problems off and on much of his life, h/o multiple pneumonias, born about 6 weeks early Shunt for hydrocephlas Both ears hurt No fevers Coughing more than usual last couple of days, has not been able to expel sputum, weak cough Not followed regularly by pulm Sees a neurosurgeon for shunt and behavioral psychologist    Depression screen [facility] 2/9 04/19/2015 01/13/2015 12/13/2014 07/15/2014  Decreased Interest 0 0 0 0  Down, Depressed, Hopeless 0 0 0 0  PHQ - 2 Score 0 0 0 0     Relevant past medical, surgical, family and social history reviewed and updated as indicated. Interim medical history since our last visit reviewed. Allergies and medications reviewed and updated.    ROS: Per HPI unless specifically indicated above  History  Smoking status  . Never Smoker   Smokeless tobacco  . Never Used    Past Medical History Patient Active Problem List   Diagnosis Date Noted  . Acute suppurative otitis media 01/10/2015  . Trisomy 21 12/08/2013  . Congenital hydrocephalus ([place]) 12/08/2013  . Need for prophylactic vaccination and inoculation against influenza 05/11/2013  . Right otitis externa 05/11/2013  . Tinea pedis 05/11/2013  . Severe obesity (BMI >= 40) ([place]) 04/05/2013  . Unspecified vitamin D deficiency 01/23/2013  . OSA (obstructive sleep apnea)   . Pituitary adenoma ([place])   . Recurrent aspiration pneumonia ([place]) 09/23/2012  . Hypersomnia with sleep apnea, unspecified 09/23/2012  . Ingrown nail 09/23/2012  . Unspecified asthma(493.90) 09/23/2012  . Anterior pituitary hormones causing adverse effect in therapeutic use 09/02/2012  . Headache(784.0) 09/02/2012  .  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 Dementia in conditions classified elsewhere with behavioral disturbance 09/02/2012  . Shortness of breath 07/05/2012  . Community acquired pneumonia 07/02/2012  . Pneumonia 07/06/2011  . Fever 07/06/2011  . GERD (gastroesophageal reflux disease) 07/06/2011  . Schizoaffective disorder [facility])     Current Outpatient Prescriptions  Medication Sig Dispense Refill  . cholecalciferol (VITAMIN D) 1000 UNITS tablet Take 6,000 Units by mouth daily.     . hydrOXYzine (VISTARIL) 50 MG capsule Take 1 capsule (50 mg total) by mouth 3 (three) times daily as neededf or anxiety. 90 capsule 5  . ipratropium-albuterol (DUONEB) 0.5-2.5 (3) MG/3ML SOLN Take 3 mLs by nebulization every 6 (six) hours as needed. Dx 493.90 360 mL 5  . Melatonin 10 MG TABS Take 1 tablet by mouth at bedtime.    Marland Kitchen neomycin-polymyxin-hydrocortisone (CORTISPORIN) otic solution Place 3 drops into the right ear 4 (four) times daily. 10 mL 0  . traZODone (DESYREL) 100 MG tablet Take 2 tablets (200 mg total) by mouth at bedtime. 60 tablet 5  . levofloxacin (LEVAQUIN) 750 MG tablet Take 1 tablet (750 mg total) by mouth daily. 5 tablet 0  . predniSONE (DELTASONE) 20 MG tablet 2 po at same time daily for 5 days 10 tablet 0   No current facility-administered medications for this visit.       Objective:    BP 110/70 mmHg  Pulse 87  Temp(Src) 97 F (36.1 C) (Oral)  Ht 5' (1.524 m)  Wt 252 lb 12.8 oz (114.669 kg)  BMI 49.37 kg/m2  SpO2 92%  Wt Readings from Last 3 Encounters:  04/19/15 252 lb 12.8 oz (114.669 kg)  03/20/15 249 lb (112.946 kg)  03/02/15 249 lb 9.6 oz (113.218 kg)     Gen: NAD, cooperative with exam, falls asleep if not actively engaging in questions EYES: EOMI, no scleral injection or icterus ENT:  R TM erythematous with suppurative effusion, bulging, L TM pearly gray, OP without erythema LYMPH: no cervical LAD CV: NRRR, normal  S1/S2, no murmur, distal pulses 2+ b/l Resp: moving air fair, R side upper lower fields with rhonchi and wheezes, L side with more minor wheezes  Abd: +BS, soft, NTND. no guarding Ext: No edema, warm Neuro: Awake, falls asleep easily  CXR Preliminary read by Assunta Found, MD: no focal infiltrate      Assessment & Plan:    [name] was seen today for cough, nasal congestion and shortness of breath, treated for asthma exacerbation as below. Return precautions given.  Diagnoses and all orders for this visit:  Cough -     DG Chest 2 View; Future  Wheezing CXR with no focal infiltrate. Will treat for reactive airways exacerbation. Dad says he has never been diagnosed with asthma or COPD though pt does require frequent prednisone for wheezing. Discussed pt and dad likely to benefit from start of a controller medicine. Should try for PFTs in future but he may not be able to complete due to intellectual disability. Dad does not want to start anything before discussing with mom [name].   Prednisone and levofloxacin start today. O2 sat 92%. Use albuterol/ipratropium 4 times a day while sick. RTC in 2 weeks for recheck breathing.  -     predniSONE (DELTASONE) 20 MG tablet; 2 po at same time daily for 5 days -     levofloxacin (LEVAQUIN) 750 MG tablet; Take 1 tablet (750 mg total) by mouth daily.  Acute right otitis media, recurrence not specified, unspecified otitis media type -     levofloxacin (LEVAQUIN) 750 MG tablet; Take 1 tablet (750 mg total) by mouth daily.  OSA (obstructive sleep apnea) Pt uses CPAP nightly, continue.    Follow up plan: Return in about 2 weeks (around 05/03/2015).  Assunta Found, MD [facility] 04/19/2015, 12:00 PM

## 2015-04-19 NOTE — Patient Instructions (Signed)
Breathing treatments 4 times a day

## 2015-05-02 ENCOUNTER — Other Ambulatory Visit: Payer: Self-pay | Admitting: *Deleted

## 2015-05-02 DIAGNOSIS — H6691 Otitis media, unspecified, right ear: Secondary | ICD-10-CM

## 2015-05-02 DIAGNOSIS — R062 Wheezing: Secondary | ICD-10-CM

## 2015-05-02 MED ORDER — LEVOFLOXACIN 750 MG PO TABS: 750.0000 mg | ORAL_TABLET | Freq: Every day | ORAL | Status: DC

## 2015-05-02 MED ORDER — PREDNISONE 20 MG PO TABS: ORAL_TABLET | ORAL | Status: DC

## 2015-05-02 MED ORDER — BENZONATATE 100 MG PO CAPS: 100.0000 mg | ORAL_CAPSULE | Freq: Two times a day (BID) | ORAL | Status: DC | PRN

## 2015-05-02 NOTE — Addendum Note (Signed)
Addended by: Zannie Cove on: 05/02/2015 12:52 PM   Modules accepted: Orders

## 2015-05-02 NOTE — Telephone Encounter (Signed)
[name]'s mom wants these meds refilled - he is not better and "when gets the respiratory bug - it can get bad"

## 2015-05-30 DIAGNOSIS — B354 Tinea corporis: Secondary | ICD-10-CM | POA: Diagnosis not present

## 2015-06-08 ENCOUNTER — Other Ambulatory Visit: Payer: Self-pay | Admitting: *Deleted

## 2015-06-08 DIAGNOSIS — J329 Chronic sinusitis, unspecified: Secondary | ICD-10-CM

## 2015-06-08 DIAGNOSIS — R0981 Nasal congestion: Secondary | ICD-10-CM

## 2015-06-08 DIAGNOSIS — R059 Cough, unspecified: Secondary | ICD-10-CM

## 2015-06-08 DIAGNOSIS — R05 Cough: Secondary | ICD-10-CM

## 2015-06-08 NOTE — Progress Notes (Signed)
Pt mom called and wants referral to allergy and asthma specialist for frequent congestion, cough and increased mucus. Referral placed

## 2015-06-20 DIAGNOSIS — B353 Tinea pedis: Secondary | ICD-10-CM | POA: Diagnosis not present

## 2015-07-12 ENCOUNTER — Encounter: Payer: Self-pay | Admitting: Pediatrics

## 2015-07-12 ENCOUNTER — Ambulatory Visit (INDEPENDENT_AMBULATORY_CARE_PROVIDER_SITE_OTHER): Payer: Medicare Other | Admitting: Pediatrics

## 2015-07-12 VITALS — BP 90/62 | HR 60 | Ht 60.5 in | Wt 258.4 lb

## 2015-07-12 DIAGNOSIS — G478 Other sleep disorders: Secondary | ICD-10-CM

## 2015-07-12 DIAGNOSIS — G4730 Sleep apnea, unspecified: Secondary | ICD-10-CM

## 2015-07-12 DIAGNOSIS — F02818 Dementia in other diseases classified elsewhere, unspecified severity, with other behavioral disturbance: Secondary | ICD-10-CM

## 2015-07-12 DIAGNOSIS — F0281 Dementia in other diseases classified elsewhere with behavioral disturbance: Secondary | ICD-10-CM | POA: Diagnosis not present

## 2015-07-12 DIAGNOSIS — Q909 Down syndrome, unspecified: Secondary | ICD-10-CM

## 2015-07-12 DIAGNOSIS — F060 Psychotic disorder with hallucinations due to known physiological condition: Secondary | ICD-10-CM

## 2015-07-12 DIAGNOSIS — G4710 Hypersomnia, unspecified: Secondary | ICD-10-CM | POA: Diagnosis not present

## 2015-07-12 MED ORDER — DONEPEZIL HCL 10 MG PO TABS: 10.0000 mg | ORAL_TABLET | Freq: Every day | ORAL | Status: DC

## 2015-07-12 NOTE — Progress Notes (Signed)
Patient: Sergio [name] MRN: [id number] Sex: male DOB: May 14, 1985  Provider: Jodi Geralds, MD Location of Care: [facility]  Note type: Routine return visit  History of Present Illness: Referral Source: Dr. Hampton Abbot History from: mother, patient and [facility] chart Chief Complaint: Trisomy 21  Sergio [name] is a 30 y.o. male who was seen on July 12, 2015, for the first time since December 07, 2013.  He has trisomy 2 with dementia that is very slowly changing over time.  I would base this on deterioration, based on memory, speech and language.  When asked about this today, his mother does not feel there has been significant change since I saw him over 18 months ago.  If he has dementia, it was progressing very slowly.  He has experienced episodes of pneumonia both community acquired and aspiration.  He is taking his pills and yogurt.  That has helped with his aspiration.  He also has less gastroesophageal reflux disease.  He is not sleeping well because he awakens with bad dreams and sometimes hallucinations.  He has sleep apnea and wears a CPAP machine, but will take it off when he wakes up.  He falls asleep around 10 p.m. and will sleep fairly well until 3 a.m. after that he is up and down several times until 5:30 a.m. when he is up for the rest of the morning.  He sleepy during the day taking cat naps for 20 minutes frequently.    His mother says that he has paranoid schizophrenia.  He has both visual and auditory hallucinations.  He is followed by Dr. Hampton Abbot who has been adjusting his medicines.  Mother feels that is working better.  She wants him seen at the special clinic at [facility].  This is based on a diagnosis of dementia.  Based on his ability of examination, I do not know if that diagnosis will hold up.  He attends the [employer] in [place] during the day.  He lives with his parents.  The only chores that he completes around the house is making his  bed.  He needs help with most of his activities of daily living other than feeding.  I did not see any evidence of perception of auditory or visual hallucinations today.  Review of Systems: 12 system review was assessed and except as noted above was otherwise negative  Past Medical History Diagnosis Date  . Down's syndrome   . GERD (gastroesophageal reflux disease)   . Hydrocephalus   . Schizoaffective disorder   . Dementia   . Memory loss   . Headache(784.0)   . Difficulty swallowing   . Weakness   . Insomnia   . Dysphagia causing pulmonary aspiration with swallowing   . OSA (obstructive sleep apnea)   . Pituitary adenoma ([place])    Hospitalizations: No., Head Injury: No., Nervous System Infections: No., Immunizations up to date: Yes.    Pneumonia end of July in 2015.  Birth History 4 lbs. 10 oz. Infant born at [redacted] weeks gestational age  Growth and Development was recalled as global delay  Behavior History none  Surgical History Procedure Laterality Date  . Csf shunt    . Brain surgery    . Ventriculoperitoneal shunt    . Tympanostomy tube placement Bilateral    Family History family history includes Emphysema in his maternal grandfather; Hypertension in his mother; Renal Disease in his father. Family history is negative for migraines, seizures, intellectual disabilities,  blindness, deafness, birth defects, chromosomal disorder, or autism.  Social History . Marital Status: Single    Spouse Name: N/A  . Number of Children: N/A  . Years of Education: N/A   Social History Main Topics  . Smoking status: Never Smoker   . Smokeless tobacco: Never Used  . Alcohol Use: No  . Drug Use: No  . Sexual Activity: No   Social History Narrative    Sergio [name] is a 30 year old male who lives with both of his parents. He has 1 sister,who is 31. He enjoys making his bed and watching movies. He has 700 movies.    Allergies Allergen Reactions  . Ceclor [Cefaclor] Anaphylaxis  .  Penicillins Anaphylaxis   Physical Exam BP 90/62 mmHg  Pulse 60  Ht 5' 0.5" (1.537 m)  Wt 258 lb 6.4 oz (117.209 kg)  BMI 49.62 kg/m2  General: alert, well developed, morbidly obese, in no acute distress, blond hair, blue eyes, right handed Head: Microcephalic, small ears, bilateral epicanthal folds, upward slanting eyes, small digits with mild clinodactyly, forefinger palmar crease, brushfield spots, abnormal dentition Ears, Nose and Throat: Otoscopic: Tympanic membranes normal. Pharynx: oropharynx is pink without exudates or tonsillar hypertrophy. Neck: supple, full range of motion, no cranial or cervical bruits Respiratory: auscultation clear Cardiovascular: no murmurs, pulses are normal Musculoskeletal: no skeletal deformities or apparent scoliosis Skin: no rashes or neurocutaneous lesions  Neurologic Exam  Mental Status: alert; oriented to person; knowledge is below normal for age; language is limited, but he is able to name objects and follow commands Cranial Nerves: visual fields are full to double simultaneous stimuli; extraocular movements are full and conjugate; Right pupil is round the left is regular, both are eccentric and react to light; funduscopic examination shows sharp disc margins with normal vessels; symmetric facial strength; midline tongue and uvula; air conduction is greater than bone conduction bilaterally; dysarthria but intelligible Motor: Normal strength, tone and mass; good fine motor movements; no pronator drift. Sensory: intact responses to cold, vibration, proprioception and stereognosis Coordination: good finger-to-nose, rapid repetitive alternating movements and finger apposition Gait and Station: Broad-based waddling gait with difficulty walking in tandem and poor balance Reflexes: symmetric and diminished bilaterally; no clonus; bilateral flexor plantar responses  Assessment 1. Trisomy 21, Q90.9. 2. Dementia due to medical condition with behavioral  disturbances, F02.81. 3. 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 4. Hypersomnia with sleep apnea, G47.10, G47.30. 5. Sleep arousal disorder, G47.8. 6. Morbid obesity due to excess calories, E66.01.  Discussion Sergio [name] seems to be stable.  I am concerned about his nocturnal arousals, but I doubt that there is much that we can do.  I will write a letter on his behalf concerning his dementia, but he may need to be tested formally and it is possible that despite the fact that he is on donepezil, that he may not be accepted as having a true dementia by my colleagues at [facility].  Plan I asked him to return to see me in six months' time.  I refilled his donepezil.  I spent 30 minutes of face-to-face time with Sergio [name] and his mother, more than half of it in consultation.   Medication List   This list is accurate as of: 07/12/15 11:59 PM.       cholecalciferol 1000 units tablet  Commonly known as:  VITAMIN D  Take 6,000 Units by mouth daily.     donepezil 10 MG tablet  Commonly known as:  ARICEPT  Take 1 tablet (10 mg total) by mouth at bedtime.     hydrOXYzine 50 MG capsule  Commonly known as:  VISTARIL  Take 1 capsule (50 mg total) by mouth 3 (three) times daily as neededf or anxiety.     ipratropium-albuterol 0.5-2.5 (3) MG/3ML Soln  Commonly known as:  DUONEB  Take 3 mLs by nebulization every 6 (six) hours as needed. Dx 493.90     levofloxacin 750 MG tablet  Commonly known as:  LEVAQUIN  Take 1 tablet (750 mg total) by mouth daily.     Melatonin 10 MG Tabs  Take 1 tablet by mouth at bedtime.     traZODone 100 MG tablet  Commonly known as:  DESYREL  Take 2 tablets (200 mg total) by mouth at bedtime.      The medication list was reviewed and reconciled. All changes or newly prescribed medications were explained.  A complete medication list was provided to the patient/caregiver.  Jodi Geralds MD

## 2015-07-17 ENCOUNTER — Encounter: Payer: Self-pay | Admitting: Pediatrics

## 2015-07-17 ENCOUNTER — Telehealth: Payer: Self-pay | Admitting: Pediatrics

## 2015-07-17 NOTE — Telephone Encounter (Signed)
Entered in error

## 2015-08-01 DIAGNOSIS — B353 Tinea pedis: Secondary | ICD-10-CM | POA: Diagnosis not present

## 2015-08-31 ENCOUNTER — Ambulatory Visit (HOSPITAL_COMMUNITY): Payer: Self-pay | Admitting: Psychiatry

## 2015-09-12 DIAGNOSIS — B353 Tinea pedis: Secondary | ICD-10-CM | POA: Diagnosis not present

## 2015-09-14 ENCOUNTER — Other Ambulatory Visit (HOSPITAL_COMMUNITY): Payer: Self-pay | Admitting: Psychiatry

## 2015-09-15 ENCOUNTER — Ambulatory Visit: Payer: Medicare Other | Admitting: Family Medicine

## 2015-09-15 ENCOUNTER — Telehealth: Payer: Self-pay | Admitting: Family

## 2015-09-15 MED ORDER — BENZONATATE 200 MG PO CAPS: 200.0000 mg | ORAL_CAPSULE | Freq: Two times a day (BID) | ORAL | Status: DC | PRN

## 2015-09-15 NOTE — Telephone Encounter (Signed)
Please disregard this message, this has already been taken care of.

## 2015-09-18 ENCOUNTER — Encounter: Payer: Self-pay | Admitting: Family

## 2015-10-12 ENCOUNTER — Other Ambulatory Visit: Payer: Self-pay

## 2015-10-12 MED ORDER — ERGOCALCIFEROL 1.25 MG (50000 UT) PO CAPS: 50000.0000 [IU] | ORAL_CAPSULE | ORAL | Status: DC

## 2015-10-12 NOTE — Telephone Encounter (Signed)
Last seen 04/19/15 Dr Evette Doffing   Last Vit D 08/10/13  40.8   This is not the Vit D that was on his med list  This was sent from [facility]

## 2015-10-18 ENCOUNTER — Other Ambulatory Visit (HOSPITAL_COMMUNITY): Payer: Self-pay | Admitting: Psychiatry

## 2015-10-23 ENCOUNTER — Telehealth: Payer: Self-pay | Admitting: Family

## 2015-11-13 ENCOUNTER — Other Ambulatory Visit: Payer: Self-pay | Admitting: Family Medicine

## 2015-11-14 ENCOUNTER — Other Ambulatory Visit (HOSPITAL_COMMUNITY): Payer: Self-pay | Admitting: Psychiatry

## 2015-11-14 NOTE — Telephone Encounter (Signed)
Last seen 04/19/15  Dr Evette Doffing   PCP  Alyse Low

## 2015-11-21 ENCOUNTER — Ambulatory Visit (INDEPENDENT_AMBULATORY_CARE_PROVIDER_SITE_OTHER): Payer: Medicare Other | Admitting: Psychiatry

## 2015-11-21 ENCOUNTER — Encounter (HOSPITAL_COMMUNITY): Payer: Self-pay | Admitting: Psychiatry

## 2015-11-21 VITALS — BP 100/70 | HR 105 | Ht 61.0 in | Wt 261.0 lb

## 2015-11-21 DIAGNOSIS — F0281 Dementia in other diseases classified elsewhere with behavioral disturbance: Secondary | ICD-10-CM

## 2015-11-21 DIAGNOSIS — F02818 Dementia in other diseases classified elsewhere, unspecified severity, with other behavioral disturbance: Secondary | ICD-10-CM

## 2015-11-21 MED ORDER — PALIPERIDONE ER 3 MG PO TB24: 3.0000 mg | ORAL_TABLET | Freq: Every day | ORAL | Status: DC

## 2015-11-21 MED ORDER — DONEPEZIL HCL 10 MG PO TABS: 10.0000 mg | ORAL_TABLET | Freq: Every day | ORAL | Status: DC

## 2015-11-21 MED ORDER — TRAZODONE HCL 100 MG PO TABS: 300.0000 mg | ORAL_TABLET | Freq: Every day | ORAL | Status: DC

## 2015-11-21 NOTE — Progress Notes (Signed)
[place] MD/PA/NP OP Progress Note  11/21/2015 2:02 PM Sergio [name]  MRN:  [id number]  Chief Complaint:  [name] says things are fine.  His mother is bringing him for a medication check upSubjective:  Sergio [name] says he is happy.  Likes his day program and no complaints about home.  His mother says things are overall good but sleep is a problem HPI: the sleep apnea remains a problem in that it keeps him asleep initially for 2-3 hrs and then he awakens every hour or so after  and comes to wake his mother so that she doesn't get enough sleep.  [name] does okay because he naps during the day and gets enough sleep.  For him it is not a problem.  No new issues. Visit Diagnosis:    ICD-9-CM ICD-10-CM   1. Dementia due to medical condition with behavioral disturbance 294.11 F02.81 donepezil (ARICEPT) 10 MG tablet    Past Psychiatric History: no changes  Past Medical History:  Past Medical History  Diagnosis Date  . Down's syndrome   . GERD (gastroesophageal reflux disease)   . Hydrocephalus   . Schizoaffective disorder   . Dementia   . Memory loss   . Headache(784.0)   . Difficulty swallowing   . Weakness   . Insomnia   . Dysphagia causing pulmonary aspiration with swallowing   . OSA (obstructive sleep apnea)   . Pituitary adenoma [facility])     Past Surgical History  Procedure Laterality Date  . Csf shunt    . Brain surgery    . Ventriculoperitoneal shunt    . Tympanostomy tube placement Bilateral     Family Psychiatric History: no changes  Family History:  Family History  Problem Relation Age of Onset  . Hypertension Mother   . Renal Disease Father   . Emphysema Maternal Grandfather     Died at 55    Social History:  Social History   Social History  . Marital Status: Single    Spouse Name: N/A  . Number of Children: N/A  . Years of Education: N/A   Social History Main Topics  . Smoking status: Never Smoker   . Smokeless tobacco: Never Used  . Alcohol Use: No  . Drug Use: No   . Sexual Activity: No   Other Topics Concern  . None   Social History Narrative   Sergio [name] is a 30 year old male who lives with both of his parents. He has 1 sister,who is 31. He enjoys making his bed and watching movies. He has 700 movies.     Allergies:  Allergies  Allergen Reactions  . Ceclor [Cefaclor] Anaphylaxis  . Penicillins Anaphylaxis    Metabolic Disorder Labs: No results found for: HGBA1C, MPG Lab Results  Component Value Date   PROLACTIN 51.5* 01/21/2013   No results found for: CHOL, TRIG, HDL, CHOLHDL, VLDL, LDLCALC   Current Medications: Current Outpatient Prescriptions  Medication Sig Dispense Refill  . benzonatate (TESSALON) 200 MG capsule Take 1 capsule (200 mg total) by mouth 2 (two) times daily as needed for cough. 40 capsule 0  . cholecalciferol (VITAMIN D) 1000 UNITS tablet Take 6,000 Units by mouth daily.     Marland Kitchen donepezil (ARICEPT) 10 MG tablet Take 1 tablet (10 mg total) by mouth at bedtime. 30 tablet 5  . ergocalciferol (VITAMIN D2) 50000 units capsule Take 1 capsule (50,000 Units total) by mouth once a week. 12 capsule 0  . hydrOXYzine (VISTARIL) 50 MG capsule TAKE 1  CAPSULE THREE TIMES DAILY AS NEEDED FOR ANXIETY 90 capsule 0  . ipratropium-albuterol (DUONEB) 0.5-2.5 (3) MG/3ML SOLN Take 3 mLs by nebulization every 6 (six) hours as needed. Dx 493.90 360 mL 5  . levofloxacin (LEVAQUIN) 750 MG tablet Take 1 tablet (750 mg total) by mouth daily. 5 tablet 0  . Melatonin 10 MG TABS Take 1 tablet by mouth at bedtime.    . paliperidone (INVEGA) 3 MG 24 hr tablet Take 1 tablet (3 mg total) by mouth at bedtime. 30 tablet 0  . traZODone (DESYREL) 100 MG tablet Take 3 tablets (300 mg total) by mouth at bedtime. 90 tablet 2   No current facility-administered medications for this visit.    Neurologic: Headache: Negative Seizure: Negative Paresthesias: Negative  Musculoskeletal: Strength & Muscle Tone: within normal limits Gait & Station: normal Patient  leans: N/A  Psychiatric Specialty Exam: ROS  Blood pressure 100/70, pulse 105, height 5\' 1"  (1.549 m), weight 261 lb (118.389 kg).Body mass index is 49.34 kg/(m^2).  General Appearance: Well Groomed  Eye Contact:  Good  Speech:  Clear and Coherent  Volume:  Normal  Mood:  Euthymic  Affect:  Flat  Thought Process:  Coherent  Orientation:  Full (Time, Place, and Person)  Thought Content: Logical and does not elaborate   Suicidal Thoughts:  No  Homicidal Thoughts:  No  Memory:  Immediate;   Good Recent;   Good Remote;   Good  Judgement:  Intact  Insight:  Lacking  Psychomotor Activity:  Normal  Concentration:  Concentration: Good and Attention Span: Good  Recall:  Good  Fund of Knowledge: adequate  Language: Good  Akathisia:  Negative  Handed:  Right  AIMS (if indicated):  0  Assets:  Housing Leisure Time Physical Health Social Support  ADL's:  Intact  Cognition: WNL  Sleep:  As above     Treatment Plan Summary:schizoaffective disorder:  Continue Invega 3 mg daily Insomnia:  Increase trazodone 300 mg hs, continue hydroxyzine 100 mg, continue melatonin, Sleep apnea:  Continue CPAP Decreased cognitive function:  Continue Aricept.  The neurologist did not see any loss of cognitive other than expected for the Down's Syndrome Down's :  Continue the day program which he likes Prescriptions renewed.  Return in 3 months.   Donnelly Angelica, MD 11/21/2015, 2:02 PM

## 2016-01-09 ENCOUNTER — Telehealth: Payer: Self-pay

## 2016-01-09 NOTE — Telephone Encounter (Signed)
I have not seen patient for cyst.

## 2016-02-14 ENCOUNTER — Other Ambulatory Visit: Payer: Self-pay | Admitting: Nurse Practitioner

## 2016-02-29 ENCOUNTER — Encounter (HOSPITAL_COMMUNITY): Payer: Self-pay | Admitting: Psychiatry

## 2016-02-29 ENCOUNTER — Ambulatory Visit (INDEPENDENT_AMBULATORY_CARE_PROVIDER_SITE_OTHER): Payer: Medicare Other | Admitting: Psychiatry

## 2016-02-29 VITALS — BP 104/68 | HR 69 | Ht 60.25 in | Wt 265.0 lb

## 2016-02-29 DIAGNOSIS — Z79899 Other long term (current) drug therapy: Secondary | ICD-10-CM | POA: Diagnosis not present

## 2016-02-29 DIAGNOSIS — Q909 Down syndrome, unspecified: Secondary | ICD-10-CM

## 2016-02-29 DIAGNOSIS — F02818 Dementia in other diseases classified elsewhere, unspecified severity, with other behavioral disturbance: Secondary | ICD-10-CM

## 2016-02-29 DIAGNOSIS — Z8249 Family history of ischemic heart disease and other diseases of the circulatory system: Secondary | ICD-10-CM

## 2016-02-29 DIAGNOSIS — F0281 Dementia in other diseases classified elsewhere with behavioral disturbance: Secondary | ICD-10-CM | POA: Diagnosis not present

## 2016-02-29 DIAGNOSIS — F209 Schizophrenia, unspecified: Secondary | ICD-10-CM

## 2016-02-29 DIAGNOSIS — Z8489 Family history of other specified conditions: Secondary | ICD-10-CM

## 2016-02-29 MED ORDER — TRAZODONE HCL 100 MG PO TABS: 300.0000 mg | ORAL_TABLET | Freq: Every day | ORAL | 5 refills | Status: DC

## 2016-02-29 MED ORDER — PALIPERIDONE ER 3 MG PO TB24: 3.0000 mg | ORAL_TABLET | Freq: Every day | ORAL | 5 refills | Status: DC

## 2016-02-29 NOTE — Progress Notes (Signed)
Patient ID: Sergio [name], male   DOB: [birth year], 30 y.o.   MRN: [id number]  [place] [id number] Progress Note  LEVII DINGUS [name] [id number] 30 y.o.  Date of visit : 02/29/2016 Chief Complaint: I am doing okay, I do not like doing work.  History of Present Illness: Patient is a 30 year old diagnosed with schizoaffective disorder, Down syndrome, GERD and hydrocephalus who presents today for a followup visit.  Dad reports that the patient seems to be doing fairly okay, continues to wake up a few times at night but goes back to bed. Dad as that he wakes up at 5 in the morning, watches TV and they have discussed with him that he needs to stay in his room to let least 8:00. On being asked about chores, dad reports that patient refuses to help with anything around the house. Discussed the need for him to make his bed, help in the yard and patient refuses.  Patient states that he likes going to the [employer], goes 3 days a week. Dad agrees that it's a good patient doesn't make him walk around, do work. Dad states that that's the only exercise patient gets as he refuses to walk around the yard or the driver. Discussed with patient in length the need to walk at least 30 minutes a day as he continues to gain weight. Dad also reports that the cut his portion sizes and half.  In regards to this hallucinations,  dad states that they do not bother the patient, adds that they help patient keep himself occupied. Dad has that patient states that he has multiple girlfriends.  He denies any side effects of the medications, any other complaints, any safety issues at this visit   Suicidal Ideation: No Plan Formed: No Patient has means to carry out plan: No  Homicidal Ideation: No Plan Formed: No Patient has means to carry out plan: No  Review of Systems  Constitutional: Positive for malaise/fatigue. Negative for chills, diaphoresis, fever and weight loss.  HENT: Negative for congestion,  ear discharge, hearing loss and sore throat.   Eyes: Negative for blurred vision, double vision, discharge and redness.  Respiratory: Negative for cough, hemoptysis, sputum production, shortness of breath and wheezing.   Cardiovascular: Negative.  Negative for chest pain and palpitations.  Gastrointestinal: Negative.  Negative for abdominal pain, constipation, diarrhea, heartburn, nausea and vomiting.  Genitourinary: Negative.  Negative for dysuria.  Musculoskeletal: Positive for myalgias. Negative for back pain and falls.  Skin: Negative.  Negative for itching and rash.  Neurological: Negative.  Negative for dizziness, focal weakness, seizures, loss of consciousness and headaches.  Endo/Heme/Allergies: Negative.  Negative for environmental allergies.  Psychiatric/Behavioral: Positive for hallucinations. Negative for depression, memory loss, substance abuse and suicidal ideas. The patient is not nervous/anxious and does not have insomnia.     Past Medical Family, Social History:  Lives with parents in [place], patient attends the adult enrichment  center 3 times a week Past Medical History:  Diagnosis Date  . Dementia   . Difficulty swallowing   . Down's syndrome   . Dysphagia causing pulmonary aspiration with swallowing   . GERD (gastroesophageal reflux disease)   . Headache(784.0)   . Hydrocephalus   . Insomnia   . Memory loss   . OSA (obstructive sleep apnea)   . Pituitary adenoma ([place])   . Schizoaffective disorder   . Weakness    Family History  Problem Relation Age of Onset  . Hypertension  Mother   . Renal Disease Father   . Emphysema Maternal Grandfather     Died at 37    Outpatient Encounter Prescriptions as of 02/29/2016  Medication Sig Dispense Refill  . benzonatate (TESSALON) 200 MG capsule Take 1 capsule (200 mg total) by mouth 2 (two) times daily as needed for cough. 40 capsule 0  . cholecalciferol (VITAMIN D) 1000 UNITS tablet Take 6,000 Units by mouth  daily.     Marland Kitchen donepezil (ARICEPT) 10 MG tablet Take 1 tablet (10 mg total) by mouth at bedtime. 30 tablet 5  . ergocalciferol (VITAMIN D2) 50000 units capsule Take 1 capsule (50,000 Units total) by mouth once a week. 12 capsule 0  . hydrOXYzine (VISTARIL) 50 MG capsule TAKE 1 CAPSULE THREE TIMES DAILY AS NEEDED FOR ANXIETY 90 capsule 0  . ipratropium-albuterol (DUONEB) 0.5-2.5 (3) MG/3ML SOLN Take 3 mLs by nebulization every 6 (six) hours as needed. Dx 493.90 360 mL 5  . levofloxacin (LEVAQUIN) 750 MG tablet Take 1 tablet (750 mg total) by mouth daily. 5 tablet 0  . Melatonin 10 MG TABS Take 1 tablet by mouth at bedtime.    . paliperidone (INVEGA) 3 MG 24 hr tablet Take 1 tablet (3 mg total) by mouth at bedtime. 30 tablet 0  . traZODone (DESYREL) 100 MG tablet Take 3 tablets (300 mg total) by mouth at bedtime. 90 tablet 2   No facility-administered encounter medications on file as of 02/29/2016.     Past Psychiatric History/Hospitalization(s): Anxiety: No Bipolar Disorder: No Depression: No Mania: No Psychosis: Yes Schizophrenia: Yes Personality Disorder: No Hospitalization for psychiatric illness: No History of Electroconvulsive Shock Therapy: No Prior Suicide Attempts: No  Physical Exam: Constitutional:  BP 104/68 (BP Location: Left Arm, Patient Position: Sitting, Cuff Size: Large)   Pulse 69   Ht 5' 0.25" (1.53 m)   Wt 265 lb (120.2 kg)   BMI 51.33 kg/m   General Appearance: alert, oriented, no acute distress and obese  Musculoskeletal: Strength & Muscle Tone: within normal limits Gait & Station: broad based Patient leans: N/A  Psychiatric: Speech (describe rate, volume, coherence, spontaneity, and abnormalities if any):  Slow in rate but normal in volume and spontaneous  Thought Process (describe rate, content, abstract reasoning, and computation):  concrete  Associations: Relevant  Thoughts: delusions and hallucinations  Mental Status: Orientation: oriented to  person and place Mood & Affect: Appears tired Attention Span & Concentration:  OK Cognition patient is intellectually limited Recent and remote memories are appropriate for patient's intellectual ability Insight and judgment seems to fluctuate between fair to poor as patient is intellectually limited Fund of knowledge and language: Poor  Medical Decision Making (Choose Three): Established Problem, Stable/Improving (1), Review of Psycho-Social Stressors (1), Review of Last Therapy Session (1), Review of Medication Regimen & Side Effects (2) and Review of New Medication or Change in Dosage (2)  Assessment: Axis I: schizo affective disorder, dementia  Axis II:  deferred  Axis [name]:  Down syndrome, GERD, status post pneumonia, hydrocephalus  Axis IV:  moderate  Axis V: 50   Plan:  Schizophrenia : Continue Invega 3 mg one at night for psychosis. Discussed the need to get a lipid panel, hemoglobin A1c at his next visit with his primary care physician. Also discussed diet and exercise in length with dad and patient at this visit Insomnia:  Continue trazodone 200 mg at bedtime for sleep. Continue melatonin 10 mg at bedtime for sleep Change Vistaril to 50 mg once  when necessary daily for anxiety/agitation and 100 mg at bedtime to help with sleep.  GERD: Continue diet modification along with Prilosec to help with patient's GERD Obstructive sleep apnea: Use CPAP daily at night for sleep apnea.  Dementia: Continue Aricept 10 mg daily for dementia Aspiration pneumonia: Continue to take the medications with yogurt to prevent aspiration Continue breathing treatments or wheezing as needed. Currently patient is not wheezing Down's syndrome: Mom is providing some of the CAPP Services for patient  Continue to attend adult enrichment center 3 days a week Call when necessary Followup in 6 months This followup visit was of moderate medical complexity as patient has multiple medical issues.  Discussed diet and exercise in length at this visit, for choices along with trying to walk 15-30 minutes every day. Patient's dementia currently is stable and he is on Aricept. Patient's weight continues to be an issue due to his poor choices and immobility. Hampton Abbot, MD

## 2016-03-18 ENCOUNTER — Other Ambulatory Visit: Payer: Self-pay | Admitting: Nurse Practitioner

## 2016-04-15 ENCOUNTER — Other Ambulatory Visit (HOSPITAL_COMMUNITY): Payer: Self-pay

## 2016-04-15 MED ORDER — HYDROXYZINE PAMOATE 50 MG PO CAPS: ORAL_CAPSULE | ORAL | 0 refills | Status: DC

## 2016-05-14 ENCOUNTER — Ambulatory Visit (INDEPENDENT_AMBULATORY_CARE_PROVIDER_SITE_OTHER): Payer: Medicare Other | Admitting: Pediatrics

## 2016-05-15 ENCOUNTER — Other Ambulatory Visit (HOSPITAL_COMMUNITY): Payer: Self-pay | Admitting: Psychiatry

## 2016-05-30 ENCOUNTER — Encounter (INDEPENDENT_AMBULATORY_CARE_PROVIDER_SITE_OTHER): Payer: Self-pay | Admitting: *Deleted

## 2016-06-14 ENCOUNTER — Ambulatory Visit (INDEPENDENT_AMBULATORY_CARE_PROVIDER_SITE_OTHER): Payer: Medicare Other

## 2016-06-14 ENCOUNTER — Ambulatory Visit (INDEPENDENT_AMBULATORY_CARE_PROVIDER_SITE_OTHER): Payer: Medicare Other | Admitting: Family Medicine

## 2016-06-14 ENCOUNTER — Encounter: Payer: Self-pay | Admitting: Family Medicine

## 2016-06-14 VITALS — BP 118/78 | HR 80 | Temp 97.0°F | Ht 60.0 in | Wt 265.0 lb

## 2016-06-14 DIAGNOSIS — J329 Chronic sinusitis, unspecified: Secondary | ICD-10-CM

## 2016-06-14 DIAGNOSIS — R0989 Other specified symptoms and signs involving the circulatory and respiratory systems: Secondary | ICD-10-CM | POA: Diagnosis not present

## 2016-06-14 DIAGNOSIS — J40 Bronchitis, not specified as acute or chronic: Secondary | ICD-10-CM | POA: Diagnosis not present

## 2016-06-14 LAB — VERITOR FLU A/B WAIVED
INFLUENZA A: NEGATIVE
INFLUENZA B: NEGATIVE

## 2016-06-14 MED ORDER — IPRATROPIUM-ALBUTEROL 0.5-2.5 (3) MG/3ML IN SOLN
3.0000 mL | Freq: Four times a day (QID) | RESPIRATORY_TRACT | 5 refills | Status: DC | PRN
Start: 2016-06-14 — End: 2017-05-22

## 2016-06-14 MED ORDER — LEVOFLOXACIN 500 MG PO TABS: 500.0000 mg | ORAL_TABLET | Freq: Every day | ORAL | 0 refills | Status: DC

## 2016-06-14 NOTE — Progress Notes (Signed)
Subjective:  Patient ID: Sergio [name], male    DOB: [birth year]  Age: 31 y.o. MRN: [id number]  CC: Cough; Nasal Congestion; and Fever   HPI Sergio [name] presents for  Patient presents with loose cough, runny stuffy nose. Diffuse headache of moderate intensity. Patient also has chills and subjective fever. Body aches. Has sapped the energy & listless. Onset 2 days ago. Dad says that he is very prone to pneumonia due to his mental challenges particularly the Down syndrome. He's had 12 episodes in his lifetime. He wants to avoid at all costs seeing his son have that again.   History Sergio [name] has a past medical history of Dementia; Difficulty swallowing; Down's syndrome; Dysphagia causing pulmonary aspiration with swallowing; GERD (gastroesophageal reflux disease); Headache(784.0); Hydrocephalus; Insomnia; Memory loss; OSA (obstructive sleep apnea); Pituitary adenoma ([place]); Schizoaffective disorder; and Weakness.   He has a past surgical history that includes CSF shunt; Brain surgery; Ventriculoperitoneal shunt; and Tympanostomy tube placement (Bilateral).   His family history includes Emphysema in his maternal grandfather; Hypertension in his mother; Renal Disease in his father.He reports that he has never smoked. He has never used smokeless tobacco. He reports that he does not drink alcohol or use drugs.  Current Outpatient Prescriptions on File Prior to Visit  Medication Sig Dispense Refill  . cholecalciferol (VITAMIN D) 1000 UNITS tablet Take 6,000 Units by mouth daily.     Marland Kitchen donepezil (ARICEPT) 10 MG tablet Take 1 tablet (10 mg total) by mouth at bedtime. 30 tablet 5  . hydrOXYzine (VISTARIL) 50 MG capsule TAKE 1 CAPSULE THREE TIMES DAILY AS NEEDED FOR ANXIETY 90 capsule 0  . Melatonin 10 MG TABS Take 1 tablet by mouth at bedtime.    . paliperidone (INVEGA) 3 MG 24 hr tablet Take 1 tablet (3 mg total) by mouth at bedtime. 30 tablet 5  . traZODone (DESYREL) 100 MG tablet Take 3  tablets (300 mg total) by mouth at bedtime. 90 tablet 5  . Vitamin D, Ergocalciferol, (DRISDOL) 50000 units CAPS capsule TAKE 1 CAPSULE ONCE A WEEK 12 capsule 0   No current facility-administered medications on file prior to visit.     ROS Review of Systems  Constitutional: Positive for activity change, appetite change, chills and fever.  HENT: Negative for congestion, ear discharge, ear pain, hearing loss, nosebleeds, postnasal drip, rhinorrhea, sinus pressure, sneezing and trouble swallowing.   Respiratory: Positive for cough. Negative for chest tightness and shortness of breath.   Cardiovascular: Negative for chest pain and palpitations.  Musculoskeletal: Positive for myalgias.  Skin: Negative for color change and rash.    Objective:  BP 118/78   Pulse 80   Temp 97 F (36.1 C) (Oral)   Ht 5' (1.524 m)   Wt 265 lb (120.2 kg)   BMI 51.75 kg/m   Physical Exam  Constitutional: He is oriented to person, place, and time. He appears well-developed and well-nourished.  HENT:  Head: Normocephalic and atraumatic.  Right Ear: Tympanic membrane and external ear normal. No decreased hearing is noted.  Left Ear: Tympanic membrane and external ear normal. No decreased hearing is noted.  Nose: Mucosal edema present. Right sinus exhibits no frontal sinus tenderness. Left sinus exhibits no frontal sinus tenderness.  Mouth/Throat: No oropharyngeal exudate or posterior oropharyngeal erythema.  Eyes: EOM are normal. Pupils are equal, round, and reactive to light.  Neck: No Brudzinski's sign noted.  Pulmonary/Chest: Breath sounds normal. No respiratory distress. He has no wheezes. He has  no rales.  Abdominal: Soft. There is no tenderness.  Lymphadenopathy:       Head (right side): No preauricular adenopathy present.       Head (left side): No preauricular adenopathy present.       Right cervical: No superficial cervical adenopathy present.      Left cervical: No superficial cervical adenopathy  present.  Neurological: He is alert and oriented to person, place, and time.  Skin: Skin is warm and dry.    Assessment & Plan:   Sergio [name] was seen today for cough, nasal congestion and fever.  Diagnoses and all orders for this visit:  Chest congestion -     Veritor Flu A/B Waived -     DG Chest 2 View; Future  Sinobronchitis  Other orders -     levofloxacin (LEVAQUIN) 500 MG tablet; Take 1 tablet (500 mg total) by mouth daily. For 10 days -     ipratropium-albuterol (DUONEB) 0.5-2.5 (3) MG/3ML SOLN; Take 3 mLs by nebulization every 6 (six) hours as needed. Dx 493.90   I have discontinued Mr. Maiello's levofloxacin and benzonatate. I am also having him start on levofloxacin. Additionally, I am having him maintain his Melatonin, cholecalciferol, donepezil, paliperidone, traZODone, Vitamin D (Ergocalciferol), hydrOXYzine, and ipratropium-albuterol.  Meds ordered this encounter  Medications  . levofloxacin (LEVAQUIN) 500 MG tablet    Sig: Take 1 tablet (500 mg total) by mouth daily. For 10 days    Dispense:  10 tablet    Refill:  0  . ipratropium-albuterol (DUONEB) 0.5-2.5 (3) MG/3ML SOLN    Sig: Take 3 mLs by nebulization every 6 (six) hours as needed. Dx 493.90    Dispense:  360 mL    Refill:  5    Diagnosis code 493.90 Maintenance treatment    Influenza AB test negative. Chest x-ray possible perihilar infiltrate, right middle lobe. Await radiology review. Follow-up: Return if symptoms worsen or fail to improve.  Claretta Fraise, M.D.

## 2016-06-24 ENCOUNTER — Other Ambulatory Visit: Payer: Self-pay | Admitting: Family Medicine

## 2016-07-02 ENCOUNTER — Encounter (INDEPENDENT_AMBULATORY_CARE_PROVIDER_SITE_OTHER): Payer: Self-pay | Admitting: Pediatrics

## 2016-07-02 ENCOUNTER — Ambulatory Visit (INDEPENDENT_AMBULATORY_CARE_PROVIDER_SITE_OTHER): Payer: Medicare Other | Admitting: Pediatrics

## 2016-07-02 VITALS — BP 120/70 | HR 68 | Ht 60.5 in | Wt 270.0 lb

## 2016-07-02 DIAGNOSIS — Q909 Down syndrome, unspecified: Secondary | ICD-10-CM

## 2016-07-02 DIAGNOSIS — F02818 Dementia in other diseases classified elsewhere, unspecified severity, with other behavioral disturbance: Secondary | ICD-10-CM

## 2016-07-02 DIAGNOSIS — F0281 Dementia in other diseases classified elsewhere with behavioral disturbance: Secondary | ICD-10-CM

## 2016-07-02 DIAGNOSIS — G478 Other sleep disorders: Secondary | ICD-10-CM

## 2016-07-02 MED ORDER — DONEPEZIL HCL 10 MG PO TABS: 10.0000 mg | ORAL_TABLET | Freq: Every day | ORAL | 5 refills | Status: DC

## 2016-07-02 NOTE — Progress Notes (Signed)
Patient: Sergio [name] MRN: [id number] Sex: male DOB: 1986/01/18  Provider: Wyline Copas, MD Location of Care: [facility]  Note type: Routine return visit  History of Present Illness: Referral Source: Dr. Hampton [name] History from: mother, patient and [facility] chart Chief Complaint: Trisomy 21  Sergio [name] is a 31 y.o. male who was evaluated on July 02, 2016, for the first time since July 12, 2015.  He has trisomy 21 and at one time appeared to have syndrome of dementia based on deterioration in his memory, speech, and language.  His mother feels that there has not been a significant change since I saw him a year ago, which means that there has been little change over 30 months.  We were treating him with donepezil, but he is only receiving 10 mg at nighttime.  He is followed by Dr. Hampton [name] who has treated him with trazodone at nighttime and Invega for visual and auditory hallucinations.  Sergio [name] attends an adult daycare in [place] for three days per week, which is funded by [employer].  Mother is treated as a family caregiver and supported by [employer].  There are no other people coming into the home because he becomes very uncomfortable with strangers.  He is morbidly obese.  He gained another 12 pounds.  He had a series of viruses this winter, the worst of which was a gastroenteritis in January 2018.  He has sleep apnea and has a CPAP machine, which he wears only occasionally.  He will sleep for a few hours to take the mask off, have trouble sleeping, and then come into see his parents.  This goes on and off all night long.  His mother finally gets up at 5:30 to 6 o'clock to feed him.  Interestingly, his hallucinations involve characters that he seen in movies or in cartoons.  He often will fight them off and gives the feeling that he is being attacked.  He also talks to his sister who very often is not there.  At a certain point, he will look at his parents and  ask "am I back?"  They assuring him that he is.  Our assumption is that he realizes that there is something altered about his perceptions, which subsides intermittently.  In general, these problems are chronic and stable.  The only medication that I prescribe for him is the donepezil.  Review of Systems: 12 system review was unremarkable  Past Medical History Diagnosis Date  . Dementia   . Difficulty swallowing   . Down's syndrome   . Dysphagia causing pulmonary aspiration with swallowing   . GERD (gastroesophageal reflux disease)   . Headache(784.0)   . Hydrocephalus   . Insomnia   . Memory loss   . OSA (obstructive sleep apnea)   . Pituitary adenoma ([place])   . Schizoaffective disorder   . Weakness    Hospitalizations: No., Head Injury: No., Nervous System Infections: No., Immunizations up to date: Yes.    Pneumonia end of July in 2015.  Birth History 4 lbs. 10 oz. Infant born at [redacted] weeks gestational age  Growth and Development was recalled as global delay  Behavior History none  Surgical History Procedure Laterality Date  . BRAIN SURGERY    . CSF SHUNT    . TYMPANOSTOMY TUBE PLACEMENT Bilateral   . VENTRICULOPERITONEAL SHUNT     Family History family history includes Emphysema in his maternal grandfather; Hypertension in his mother; Renal Disease in his  father. Family history is negative for migraines, seizures, intellectual disabilities, blindness, deafness, birth defects, chromosomal disorder, or autism.  Social History . Marital status: Single   Social History Main Topics  . Smoking status: Never Smoker  . Smokeless tobacco: Never Used  . Alcohol use No  . Drug use: No  . Sexual activity: No   Social History Narrative    [name] is a 31 year old male.    He lives with both of his parents. He has 1 sister,who is 32.     He enjoys making his bed and watching movies. He has 700 movies.    Allergies Allergen Reactions  . Ceclor [Cefaclor] Anaphylaxis    . Penicillins Anaphylaxis   Physical Exam BP 120/70   Pulse 68   Ht 5' 0.5" (1.537 m)   Wt 270 lb (122.5 kg)   BMI 51.86 kg/m   General: alert, well developed, morbidly obese, in no acute distress, blond hair, blue eyes, right handed Head: microcephalic, brachiocephalic, small ears, bilateral epicanthal folds, upward slanting eyes, small digits with mild clinodactyly, four finger palmar crease, Brushfield spots in his irises, abnormal dentition Ears, Nose and Throat: Otoscopic: tympanic membranes normal; pharynx: oropharynx is pink without exudates or tonsillar hypertrophy Neck: supple, full range of motion, no cranial or cervical bruits Respiratory: auscultation clear Cardiovascular: no murmurs, pulses are normal Musculoskeletal: no skeletal deformities or apparent scoliosis Skin: no rashes or neurocutaneous lesions  Neurologic Exam  Mental Status: alert; oriented to person; knowledge is below normal for age; language is limited but he is able to name objects and follow commands, and makes good eye contact he shows no signs of hallucinations in the office Cranial Nerves: visual fields are full to double simultaneous stimuli; extraocular movements are full and conjugate; the right pupil is round the left is irregular, both are reactive to light; funduscopic examination shows sharp disc margins with normal vessels; symmetric facial strength; midline tongue and uvula; he turns to localize sound bilaterally Motor: Normal functional strength, mildly diminished tone and normal mass; clumsy fine motor movements; no pronator drift Sensory: intact responses to cold and vibration Coordination: good finger-to-nose; rapid repetitive alternating movements and finger apposition are clumsy Gait and Station: Broad-based waddling gait and station; balance is fair Reflexes: symmetric and diminished bilaterally; no clonus; bilateral flexor plantar responses  Assessment 1. Trisomy 21,  Q90.9. 2. Dementia due to medical condition with behavioral disturbance, F02.81. 3. Sleep arousal disorder, G47.8. 4. Severe obesity, E66.01. 5. 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   Hypersomnia with sleep apnea, G47.10, G47.30  Discussion Sergio [name] is doing quite well.  I am pleased that cognitively he does not seem to be declining.  I agree with that based on my assessment with him today.  I think that medications are largely controlling his hallucinations.  Plan I refilled his prescription for donepezil.  He will return to see me in six months' time.  I spent 30 minutes of face-to-face time with Sergio [name] and his mother.   Medication List   Accurate as of 07/02/16 11:59 PM      cholecalciferol 1000 units tablet Commonly known as:  VITAMIN D Take 6,000 Units by mouth daily.   donepezil 10 MG tablet Commonly known as:  ARICEPT Take 1 tablet (10 mg total) by mouth at bedtime.   hydrOXYzine 50 MG capsule Commonly known as:  VISTARIL TAKE 1 CAPSULE THREE TIMES DAILY AS NEEDED FOR ANXIETY   ipratropium-albuterol 0.5-2.5 (3) MG/3ML  Soln Commonly known as:  DUONEB Take 3 mLs by nebulization every 6 (six) hours as needed. Dx 493.90   ipratropium-albuterol 0.5-2.5 (3) MG/3ML Soln Commonly known as:  DUONEB NEBULIZE 1 VIAL EVERY 6 HOURS AS NEEDED   levofloxacin 500 MG tablet Commonly known as:  LEVAQUIN Take 1 tablet (500 mg total) by mouth daily. For 10 days   Melatonin 10 MG Tabs Take 1 tablet by mouth at bedtime.   paliperidone 3 MG 24 hr tablet Commonly known as:  INVEGA Take 1 tablet (3 mg total) by mouth at bedtime.   traZODone 100 MG tablet Commonly known as:  DESYREL Take 3 tablets (300 mg total) by mouth at bedtime.   Vitamin D (Ergocalciferol) 50000 units Caps capsule Commonly known as:  DRISDOL TAKE 1 CAPSULE ONCE A WEEK    The medication list was reviewed and reconciled. All changes or newly prescribed medications were  explained.  A complete medication list was provided to the patient/caregiver.  Jodi Geralds MD

## 2016-07-02 NOTE — Patient Instructions (Signed)
We will refill your prescription in 6 months.  I will be happy to see Sergio [name] sooner based on his condition.

## 2016-08-15 ENCOUNTER — Other Ambulatory Visit: Payer: Self-pay | Admitting: Family

## 2016-09-16 ENCOUNTER — Other Ambulatory Visit (HOSPITAL_COMMUNITY): Payer: Self-pay | Admitting: Psychiatry

## 2016-09-17 ENCOUNTER — Other Ambulatory Visit: Payer: Self-pay | Admitting: Family

## 2016-10-21 ENCOUNTER — Other Ambulatory Visit (HOSPITAL_COMMUNITY): Payer: Self-pay | Admitting: Psychiatry

## 2016-11-18 ENCOUNTER — Other Ambulatory Visit (HOSPITAL_COMMUNITY): Payer: Self-pay | Admitting: Psychiatry

## 2016-11-19 ENCOUNTER — Other Ambulatory Visit: Payer: Self-pay | Admitting: Family

## 2016-11-19 ENCOUNTER — Other Ambulatory Visit (HOSPITAL_COMMUNITY): Payer: Self-pay | Admitting: Psychiatry

## 2016-11-20 NOTE — Telephone Encounter (Signed)
Last Vit D 08/10/13  40.8

## 2016-11-21 ENCOUNTER — Encounter (HOSPITAL_COMMUNITY): Payer: Self-pay | Admitting: Psychiatry

## 2016-11-21 ENCOUNTER — Ambulatory Visit (INDEPENDENT_AMBULATORY_CARE_PROVIDER_SITE_OTHER): Payer: Medicare Other | Admitting: Psychiatry

## 2016-11-21 VITALS — BP 120/72 | HR 75 | Ht 60.5 in | Wt 265.2 lb

## 2016-11-21 DIAGNOSIS — Q039 Congenital hydrocephalus, unspecified: Secondary | ICD-10-CM

## 2016-11-21 DIAGNOSIS — F02818 Dementia in other diseases classified elsewhere, unspecified severity, with other behavioral disturbance: Secondary | ICD-10-CM

## 2016-11-21 DIAGNOSIS — D352 Benign neoplasm of pituitary gland: Secondary | ICD-10-CM

## 2016-11-21 DIAGNOSIS — Q909 Down syndrome, unspecified: Secondary | ICD-10-CM | POA: Diagnosis not present

## 2016-11-21 DIAGNOSIS — F209 Schizophrenia, unspecified: Secondary | ICD-10-CM | POA: Diagnosis not present

## 2016-11-21 DIAGNOSIS — F0281 Dementia in other diseases classified elsewhere with behavioral disturbance: Secondary | ICD-10-CM | POA: Diagnosis not present

## 2016-11-21 MED ORDER — TRAZODONE HCL 100 MG PO TABS: 300.0000 mg | ORAL_TABLET | Freq: Every day | ORAL | 6 refills | Status: DC

## 2016-11-21 MED ORDER — PALIPERIDONE ER 3 MG PO TB24: 3.0000 mg | ORAL_TABLET | Freq: Every day | ORAL | 6 refills | Status: DC

## 2016-11-21 MED ORDER — HYDROXYZINE PAMOATE 50 MG PO CAPS: ORAL_CAPSULE | ORAL | 4 refills | Status: DC

## 2016-11-21 NOTE — Progress Notes (Signed)
[place] MD/PA/NP OP Progress Note  11/21/2016 2:58 PM Sergio [name]  MRN:  [id number]  Chief Complaint:  Chief Complaint    Schizophrenia; Dementia; Follow-up     Subjective:  I'm doing okay HPI: Patient is a 31 year old white male diagnosed with schizoaffective disorder, Down syndrome, GERD, hydrocephalus and dementia who presents today for follow-up visit.  Mom reports that patient continues to be stable, as that he still hears voices, talks to them but states that they're no longer threatening, are more friendly and keep patient occupied. She states that he goes to an adult daycare center, does well there. At home she reports he still struggles with his sleep, wakes up multiple times at night and has to be redirected back to go to bed.  In regards to his dementia, mom reports that he continues to be on Aricept and sees Dr. Gaynell Face every 6 months and continues to be stable.  In regards to his eating, mom states that they try to work on him making better choices but that patient continues to struggle with his eating and she is unable to get him to make healthy choices. She has that he's not seen his primary care physician for some time now and will reschedule a visit to see if he can be referred to a nutritional therapist to help him understand his poor eating choices.  Mom states that patient's not had pneumonia for some time now adds that the use thickened liquids and so he does not end up aspirating  She denies having any concerns at this visit and reports that he continues to be stable Visit Diagnosis: No diagnosis found.  Past Psychiatric History: Unchanged from previous visit  Past Medical History:  Past Medical History:  Diagnosis Date  . Dementia   . Difficulty swallowing   . Down's syndrome   . Dysphagia causing pulmonary aspiration with swallowing   . GERD (gastroesophageal reflux disease)   . Headache(784.0)   . Hydrocephalus   . Insomnia   . Memory loss   . OSA  (obstructive sleep apnea)   . Pituitary adenoma ([place])   . Schizoaffective disorder   . Weakness     Past Surgical History:  Procedure Laterality Date  . BRAIN SURGERY    . CSF SHUNT    . TYMPANOSTOMY TUBE PLACEMENT Bilateral   . VENTRICULOPERITONEAL SHUNT      Family Psychiatric History: None reported  Family History:  Family History  Problem Relation Age of Onset  . Hypertension Mother   . Renal Disease Father   . Emphysema Maternal Grandfather        Died at 64    Social History:  Social History   Social History  . Marital status: Single    Spouse name: N/A  . Number of children: N/A  . Years of education: N/A   Social History Main Topics  . Smoking status: Never Smoker  . Smokeless tobacco: Never Used  . Alcohol use No  . Drug use: No  . Sexual activity: No   Other Topics Concern  . Not on file   Social History Narrative   Shawntez is a 31 year old male.   He lives with both of his parents. He has 1 sister,who is 32.    He enjoys making his bed and watching movies. He has 700 movies.     Allergies:  Allergies  Allergen Reactions  . Ceclor [Cefaclor] Anaphylaxis  . Penicillins Anaphylaxis    Metabolic Disorder Labs:  No results found for: HGBA1C, MPG Lab Results  Component Value Date   PROLACTIN 51.5 (H) 01/21/2013   No results found for: CHOL, TRIG, HDL, CHOLHDL, VLDL, LDLCALC   Current Medications: Current Outpatient Prescriptions  Medication Sig Dispense Refill  . cholecalciferol (VITAMIN D) 1000 UNITS tablet Take 6,000 Units by mouth daily.     Marland Kitchen donepezil (ARICEPT) 10 MG tablet Take 1 tablet (10 mg total) by mouth at bedtime. 30 tablet 5  . hydrOXYzine (VISTARIL) 50 MG capsule TAKE 1 CAPSULE THREE TIMES DAILY AS NEEDED FOR ANXIETY 90 capsule 0  . ipratropium-albuterol (DUONEB) 0.5-2.5 (3) MG/3ML SOLN Take 3 mLs by nebulization every 6 (six) hours as needed. Dx 493.90 360 mL 5  . ipratropium-albuterol (DUONEB) 0.5-2.5 (3) MG/3ML SOLN NEBULIZE 1  VIAL EVERY 6 HOURS AS NEEDED 180 mL 1  . levofloxacin (LEVAQUIN) 500 MG tablet Take 1 tablet (500 mg total) by mouth daily. For 10 days 10 tablet 0  . Melatonin 10 MG TABS Take 1 tablet by mouth at bedtime.    . paliperidone (INVEGA) 3 MG 24 hr tablet TAKE ONE TABLET AT BEDTIME 30 tablet 0  . traZODone (DESYREL) 100 MG tablet TAKE 3 TABLETS AT BEDTIME 90 tablet 0  . Vitamin D, Ergocalciferol, (DRISDOL) 50000 units CAPS capsule TAKE 1 CAPSULE ONCE A WEEK 12 capsule 0   No current facility-administered medications for this visit.     Neurologic: Headache: No Seizure: No Paresthesias: No  Musculoskeletal: Strength & Muscle Tone: within normal limits Gait & Station: broad based Patient leans: N/A  Psychiatric Specialty Exam: Review of Systems  Constitutional: Negative.  Negative for fever and malaise/fatigue.  HENT: Negative for congestion and sore throat.   Respiratory: Negative for cough, shortness of breath and wheezing.   Cardiovascular: Negative for chest pain and palpitations.  Gastrointestinal: Negative.  Negative for abdominal pain, constipation, diarrhea, heartburn, nausea and vomiting.  Musculoskeletal: Positive for joint pain. Negative for falls.       Reports pain in his knees  Skin: Negative.   Neurological: Negative for dizziness, focal weakness, seizures, loss of consciousness, weakness and headaches.  Endo/Heme/Allergies: Negative.  Negative for environmental allergies.  Psychiatric/Behavioral: Positive for hallucinations. Negative for depression, substance abuse and suicidal ideas. The patient has insomnia. The patient is not nervous/anxious.     Blood pressure 120/72, pulse 75, height 5' 0.5" (1.537 m), weight 265 lb 3.2 oz (120.3 kg).Body mass index is 50.94 kg/m.  General Appearance: Casual  Eye Contact:  Fair  Speech:  Clear and Coherent and Slow  Volume:  Normal  Mood:  Euthymic  Affect:  Appropriate  Thought Process:  Coherent, Goal Directed and  Descriptions of Associations: Intact, concrete   Orientation:  Full (Time, Place, and Person)  Thought Content: Hallucinations: Auditory   Suicidal Thoughts:  No  Homicidal Thoughts:  No  Memory:  Immediate;   Fair Recent;   Fair Remote;   Fair  Judgement:  Poor  Insight:  Lacking  Psychomotor Activity:  Mannerisms  Concentration:  Concentration: Fair and Attention Span: Fair  Recall:  [employer] of Knowledge: Poor  Language: Poor  Akathisia:  No  Handed:  Right  AIMS (if indicated):  N/A  Assets:  Financial Resources/Insurance Housing Social Support Transportation  ADL's:  Impaired  Cognition: Impaired,  Mild  Sleep:  Wakes up multiple times at night      Treatment Plan Summary:Medication management Schizophrenia Continuing beta 3 mg one at night for psychosis. Also discussed again  with mom the need for patient to have a hemoglobin A1c, a lipid panel done prior to next visit. Mom states that she will contact the PCP at [facility] and get the lab work done prior to next visit To use Vistaril 50 mg 3 times a day as needed for agitation Insomnia Continue trazodone 300 mg at bedtime for sleep. Continue melatonin 10 mg at [facility] to help patient with sleep  Discussed in length sleep hygiene, using behavioral techniques such as an alarm clock to keep patient in his room to the alarm rings in the morning at 6 AM. Discussed a reward system to help patient with his sleep Obstructive sleep apnea Continue to use CPAP daily at night for sleep apnea Dementia Continue Aricept 10 mg daily for dementia Aspiration pneumonia Continue to take medications with yogurt, use thickened liquids to prevent aspiration pneumonia Down syndrome Patient had attending adult day care center and mom providing some of the [facility] services for patient  Call when necessary Follow-up in 4-6 months  Hampton Abbot, MD 11/21/2016, 2:58 PM

## 2016-11-21 NOTE — Patient Instructions (Signed)
Needs A1C, Lipid panel , TSH, Mom to get PCP at [facility] to do it

## 2016-12-03 DIAGNOSIS — M79674 Pain in right toe(s): Secondary | ICD-10-CM | POA: Diagnosis not present

## 2016-12-03 DIAGNOSIS — M79675 Pain in left toe(s): Secondary | ICD-10-CM | POA: Diagnosis not present

## 2016-12-03 DIAGNOSIS — L84 Corns and callosities: Secondary | ICD-10-CM | POA: Diagnosis not present

## 2016-12-03 DIAGNOSIS — B351 Tinea unguium: Secondary | ICD-10-CM | POA: Diagnosis not present

## 2017-01-14 ENCOUNTER — Encounter: Payer: Self-pay | Admitting: Physician Assistant

## 2017-01-14 ENCOUNTER — Other Ambulatory Visit: Payer: Self-pay

## 2017-01-14 ENCOUNTER — Ambulatory Visit (INDEPENDENT_AMBULATORY_CARE_PROVIDER_SITE_OTHER): Payer: Medicare Other | Admitting: Physician Assistant

## 2017-01-14 VITALS — BP 115/70 | HR 73 | Temp 98.4°F | Ht 60.5 in | Wt 267.0 lb

## 2017-01-14 DIAGNOSIS — L219 Seborrheic dermatitis, unspecified: Secondary | ICD-10-CM | POA: Diagnosis not present

## 2017-01-14 DIAGNOSIS — J0110 Acute frontal sinusitis, unspecified: Secondary | ICD-10-CM

## 2017-01-14 DIAGNOSIS — J40 Bronchitis, not specified as acute or chronic: Secondary | ICD-10-CM | POA: Diagnosis not present

## 2017-01-14 DIAGNOSIS — Z13220 Encounter for screening for lipoid disorders: Secondary | ICD-10-CM

## 2017-01-14 MED ORDER — AZITHROMYCIN 250 MG PO TABS: ORAL_TABLET | ORAL | 0 refills | Status: DC

## 2017-01-14 MED ORDER — MOMETASONE FUROATE 0.1 % EX CREA: 1.0000 "application " | TOPICAL_CREAM | Freq: Every day | CUTANEOUS | 5 refills | Status: DC

## 2017-01-14 MED ORDER — CLOBETASOL PROPIONATE 0.05 % EX SOLN: 1.0000 "application " | Freq: Two times a day (BID) | CUTANEOUS | 5 refills | Status: DC

## 2017-01-14 NOTE — Progress Notes (Signed)
BP 115/70   Pulse 73   Temp 98.4 F (36.9 C) (Oral)   Ht 5' 0.5" (1.537 m)   Wt 267 lb (121.1 kg)   BMI 51.29 kg/m    Subjective:    Patient ID: Sergio [name], male    DOB: May 03, 1986, 31 y.o.   MRN: [id number]  HPI: Sergio [name] is a 31 y.o. male presenting on 01/14/2017 for Cough, wheezing and Rash (on face, using sister's mometasone furoate cream, seems to help)  Patient with several days of progressing upper respiratory and bronchial symptoms. Initially there was more upper respiratory congestion. This progressed to having significant cough that is productive throughout the day and severe at night. There is occasional wheezing after coughing. Sometimes there is slight dyspnea on exertion. It is productive mucus that is yellow in color. Denies any blood. The patient also has significant rash on his face and areas on the scalp. His mom states that he has had redness and peeling on the scalp for many years. She uses medicated dandruff shampoos. She does not rotate through them. She tried a family member's mometasone on his face and had a great improvement. In the past she was told to use a topical antibiotic which did not help any real  Relevant past medical, surgical, family and social history reviewed and updated as indicated. Allergies and medications reviewed and updated.  Past Medical History:  Diagnosis Date  . Dementia   . Difficulty swallowing   . Down's syndrome   . Dysphagia causing pulmonary aspiration with swallowing   . GERD (gastroesophageal reflux disease)   . Headache(784.0)   . Hydrocephalus   . Insomnia   . Memory loss   . OSA (obstructive sleep apnea)   . Pituitary adenoma ([place])   . Schizoaffective disorder   . Weakness     Past Surgical History:  Procedure Laterality Date  . BRAIN SURGERY    . CSF SHUNT    . TYMPANOSTOMY TUBE PLACEMENT Bilateral   . VENTRICULOPERITONEAL SHUNT      Review of Systems  Constitutional: Positive for fatigue.  Negative for appetite change.  HENT: Positive for congestion, rhinorrhea, sinus pressure and sore throat.   Eyes: Negative.  Negative for pain and visual disturbance.  Respiratory: Positive for cough, shortness of breath and wheezing. Negative for chest tightness.   Cardiovascular: Negative.  Negative for chest pain, palpitations and leg swelling.  Gastrointestinal: Negative.  Negative for abdominal pain, diarrhea, nausea and vomiting.  Endocrine: Negative.   Genitourinary: Negative.   Musculoskeletal: Positive for back pain and myalgias.  Skin: Positive for rash. Negative for color change.  Neurological: Positive for headaches. Negative for weakness and numbness.  Psychiatric/Behavioral: Negative.     Allergies as of 01/14/2017      Reactions   Ceclor [cefaclor] Anaphylaxis   Penicillins Anaphylaxis      Medication List       Accurate as of 01/14/17  3:59 PM. Always use your most recent med list.          azithromycin 250 MG tablet Commonly known as:  ZITHROMAX Z-PAK Take as directed   cholecalciferol 1000 units tablet Commonly known as:  VITAMIN D Take 6,000 Units by mouth daily.   clobetasol 0.05 % external solution Commonly known as:  TEMOVATE Apply 1 application topically 2 (two) times daily.   donepezil 10 MG tablet Commonly known as:  ARICEPT Take 1 tablet (10 mg total) by mouth at bedtime.   hydrOXYzine  50 MG capsule Commonly known as:  VISTARIL TAKE 1 CAPSULE THREE TIMES DAILY AS NEEDED FOR ANXIETY   ipratropium-albuterol 0.5-2.5 (3) MG/3ML Soln Commonly known as:  DUONEB Take 3 mLs by nebulization every 6 (six) hours as needed. Dx 493.90   ipratropium-albuterol 0.5-2.5 (3) MG/3ML Soln Commonly known as:  DUONEB NEBULIZE 1 VIAL EVERY 6 HOURS AS NEEDED   Melatonin 10 MG Tabs Take 1 tablet by mouth at bedtime.   mometasone 0.1 % cream Commonly known as:  ELOCON Apply 1 application topically daily.   paliperidone 3 MG 24 hr tablet Commonly known as:   INVEGA Take 1 tablet (3 mg total) by mouth at bedtime.   traZODone 100 MG tablet Commonly known as:  DESYREL Take 3 tablets (300 mg total) by mouth at bedtime.   Vitamin D (Ergocalciferol) 50000 units Caps capsule Commonly known as:  DRISDOL TAKE 1 CAPSULE ONCE A WEEK            Discharge Care Instructions        Start     Ordered   01/14/17 0000  azithromycin (ZITHROMAX Z-PAK) 250 MG tablet    Question:  Supervising Provider  Answer:  Timmothy Euler   01/14/17 1452   01/14/17 0000  mometasone (ELOCON) 0.1 % cream  Daily    Question:  Supervising Provider  Answer:  Timmothy Euler   01/14/17 1452   01/14/17 0000  clobetasol (TEMOVATE) 0.05 % external solution  2 times daily    Question:  Supervising Provider  Answer:  Timmothy Euler   01/14/17 1452         Objective:    BP 115/70   Pulse 73   Temp 98.4 F (36.9 C) (Oral)   Ht 5' 0.5" (1.537 m)   Wt 267 lb (121.1 kg)   BMI 51.29 kg/m   Allergies  Allergen Reactions  . Ceclor [Cefaclor] Anaphylaxis  . Penicillins Anaphylaxis    Physical Exam  Constitutional: He appears well-developed and well-nourished.  HENT:  Head: Normocephalic and atraumatic.  Right Ear: Hearing and tympanic membrane normal.  Left Ear: Hearing and tympanic membrane normal.  Nose: Mucosal edema and sinus tenderness present. No nasal deformity. Right sinus exhibits frontal sinus tenderness. Left sinus exhibits frontal sinus tenderness.  Mouth/Throat: Posterior oropharyngeal erythema present.  Eyes: Pupils are equal, round, and reactive to light. Conjunctivae and EOM are normal. Right eye exhibits no discharge. Left eye exhibits no discharge.  Neck: Normal range of motion. Neck supple.  Cardiovascular: Normal rate, regular rhythm and normal heart sounds.   Pulmonary/Chest: Effort normal. No respiratory distress. He has no decreased breath sounds. He has wheezes. He has no rhonchi. He has no rales.  Abdominal: Soft. Bowel sounds  are normal.  Musculoskeletal: Normal range of motion.  Skin: Skin is warm and dry. Rash noted. Rash is macular. There is erythema.       Results for orders placed or performed in visit on 06/14/16  Veritor Flu A/B Waived  Result Value Ref Range   Influenza A Negative Negative   Influenza B Negative Negative      Assessment & Plan:   1. Seborrheic dermatitis - mometasone (ELOCON) 0.1 % cream; Apply 1 application topically daily.  Dispense: 50 g; Refill: 5 - clobetasol (TEMOVATE) 0.05 % external solution; Apply 1 application topically 2 (two) times daily.  Dispense: 50 mL; Refill: 5  2. Acute non-recurrent frontal sinusitis - azithromycin (ZITHROMAX Z-PAK) 250 MG tablet; Take as directed  Dispense: 6 each; Refill: 0  3. Bronchitis - azithromycin (ZITHROMAX Z-PAK) 250 MG tablet; Take as directed  Dispense: 6 each; Refill: 0    Current Outpatient Prescriptions:  .  cholecalciferol (VITAMIN D) 1000 UNITS tablet, Take 6,000 Units by mouth daily. , Disp: , Rfl:  .  donepezil (ARICEPT) 10 MG tablet, Take 1 tablet (10 mg total) by mouth at bedtime., Disp: 30 tablet, Rfl: 5 .  hydrOXYzine (VISTARIL) 50 MG capsule, TAKE 1 CAPSULE THREE TIMES DAILY AS NEEDED FOR ANXIETY, Disp: 90 capsule, Rfl: 4 .  ipratropium-albuterol (DUONEB) 0.5-2.5 (3) MG/3ML SOLN, Take 3 mLs by nebulization every 6 (six) hours as needed. Dx 493.90, Disp: 360 mL, Rfl: 5 .  ipratropium-albuterol (DUONEB) 0.5-2.5 (3) MG/3ML SOLN, NEBULIZE 1 VIAL EVERY 6 HOURS AS NEEDED, Disp: 180 mL, Rfl: 1 .  Melatonin 10 MG TABS, Take 1 tablet by mouth at bedtime., Disp: , Rfl:  .  paliperidone (INVEGA) 3 MG 24 hr tablet, Take 1 tablet (3 mg total) by mouth at bedtime., Disp: 30 tablet, Rfl: 6 .  traZODone (DESYREL) 100 MG tablet, Take 3 tablets (300 mg total) by mouth at bedtime., Disp: 90 tablet, Rfl: 6 .  Vitamin D, Ergocalciferol, (DRISDOL) 50000 units CAPS capsule, TAKE 1 CAPSULE ONCE A WEEK, Disp: 12 capsule, Rfl: 0 .   azithromycin (ZITHROMAX Z-PAK) 250 MG tablet, Take as directed, Disp: 6 each, Rfl: 0 .  clobetasol (TEMOVATE) 0.05 % external solution, Apply 1 application topically 2 (two) times daily., Disp: 50 mL, Rfl: 5 .  mometasone (ELOCON) 0.1 % cream, Apply 1 application topically daily., Disp: 50 g, Rfl: 5 Continue all other maintenance medications as listed above.  Follow up plan: Return if symptoms worsen or fail to improve.  Educational handout given for seborrhea  Terald Sleeper PA-C [facility] [address]  [place], [place] [address] [phone]   01/14/2017, 3:59 PM

## 2017-01-14 NOTE — Patient Instructions (Signed)
Seborrheic Dermatitis, Adult Seborrheic dermatitis is a skin disease that causes red, scaly patches. It usually occurs on the scalp, and it is often called dandruff. The patches may appear on other parts of the body. Skin patches tend to appear where there are many oil glands in the skin. Areas of the body that are commonly affected include:  Scalp.  Skin folds of the body.  Ears.  Eyebrows.  Neck.  Face.  Armpits.  The bearded area of men's faces.  The condition may come and go for no known reason, and it is often long-lasting (chronic). What are the causes? The cause of this condition is not known. What increases the risk? This condition is more likely to develop in people who:  Have certain conditions, such as: ? HIV (human immunodeficiency virus). ? AIDS (acquired immunodeficiency syndrome). ? Parkinson disease. ? Mood disorders, such as depression.  Are 40-60 years old.  What are the signs or symptoms? Symptoms of this condition include:  Thick scales on the scalp.  Redness on the face or in the armpits.  Skin that is flaky. The flakes may be white or yellow.  Skin that seems oily or dry but is not helped with moisturizers.  Itching or burning in the affected areas.  How is this diagnosed? This condition is diagnosed with a medical history and physical exam. A sample of your skin may be tested (skin biopsy). You may need to see a skin specialist (dermatologist). How is this treated? There is no cure for this condition, but treatment can help to manage the symptoms. You may get treatment to remove scales, lower the risk of skin infection, and reduce swelling or itching. Treatment may include:  Creams that reduce swelling and irritation (steroids).  Creams that reduce skin yeast.  Medicated shampoo, soaps, moisturizing creams, or ointments.  Medicated moisturizing creams or ointments.  Follow these instructions at home:  Apply over-the-counter and  prescription medicines only as told by your health care provider.  Use any medicated shampoo, soaps, skin creams, or ointments only as told by your health care provider.  Keep all follow-up visits as told by your health care provider. This is important. Contact a health care provider if:  Your symptoms do not improve with treatment.  Your symptoms get worse.  You have new symptoms. This information is not intended to replace advice given to you by your health care provider. Make sure you discuss any questions you have with your health care provider. Document Released: 04/22/2005 Document Revised: 11/10/2015 Document Reviewed: 08/10/2015 Elsevier Interactive Patient Education  2018 [employer]  

## 2017-02-13 ENCOUNTER — Telehealth: Payer: Self-pay | Admitting: Family

## 2017-02-13 NOTE — Telephone Encounter (Signed)
Sergio [name] saw pt last. I have not seen patient in 2 years. If Sergio [name] does not feel like comfortable addressing, pt needs to be seen.

## 2017-02-13 NOTE — Telephone Encounter (Signed)
Has seborrheic, the ZPAK will not cover the skin.  Can her take clindamycin?

## 2017-02-13 NOTE — Telephone Encounter (Signed)
Yes

## 2017-02-13 NOTE — Telephone Encounter (Signed)
He

## 2017-02-14 MED ORDER — CLINDAMYCIN HCL 300 MG PO CAPS: 300.0000 mg | ORAL_CAPSULE | Freq: Three times a day (TID) | ORAL | 0 refills | Status: DC

## 2017-02-14 NOTE — Telephone Encounter (Signed)
Father aware via voicemail

## 2017-02-19 ENCOUNTER — Telehealth: Payer: Self-pay | Admitting: Physician Assistant

## 2017-02-19 NOTE — Telephone Encounter (Signed)
Clindamycin was sent a couple of days ago, did he start it?

## 2017-02-19 NOTE — Telephone Encounter (Signed)
What symptoms do you have? Cough and pus in his tear ducts and congestion  How long have you been sick? 5 days  Have you been seen for this problem? YES  If your provider decides to give you a prescription, which pharmacy would you like for it to be sent to? [facility]   Patient informed that this information will be sent to the clinical staff for review and that they should receive a follow up call.

## 2017-02-19 NOTE — Telephone Encounter (Signed)
Continued congestion and drainage from tear ducts Denies fever Requesting antibiotic Appointment offered, declined Please advise

## 2017-02-19 NOTE — Telephone Encounter (Signed)
Patient and dad aware

## 2017-02-26 ENCOUNTER — Encounter: Payer: Self-pay | Admitting: Family Medicine

## 2017-02-26 ENCOUNTER — Ambulatory Visit (INDEPENDENT_AMBULATORY_CARE_PROVIDER_SITE_OTHER): Payer: Medicare Other | Admitting: Family Medicine

## 2017-02-26 VITALS — BP 97/57 | HR 62 | Temp 97.7°F | Ht 60.0 in | Wt 266.0 lb

## 2017-02-26 DIAGNOSIS — T50905A Adverse effect of unspecified drugs, medicaments and biological substances, initial encounter: Secondary | ICD-10-CM | POA: Diagnosis not present

## 2017-02-26 DIAGNOSIS — R21 Rash and other nonspecific skin eruption: Secondary | ICD-10-CM | POA: Diagnosis not present

## 2017-02-26 MED ORDER — METHYLPREDNISOLONE ACETATE 80 MG/ML IJ SUSP
80.0000 mg | Freq: Once | INTRAMUSCULAR | Status: AC
  Administered 2017-02-26: 80 mg via INTRAMUSCULAR

## 2017-02-26 MED ORDER — PREDNISONE 10 MG PO TABS: ORAL_TABLET | ORAL | 0 refills | Status: DC

## 2017-02-26 NOTE — Progress Notes (Signed)
Subjective: CC: drug reaction PCP: Sharion Balloon, FNP ZOX:[id number] Sergio [name] is a 31 y.o. male presenting to clinic today for:  1. Drug reaction Patient is brought to the office by his mother and father.  They provide much of the information as patient has trisomy 1.  They note that he developed a rash that has gradually spread from his ears to his face and down his back.  This was first noted yesterday evening and this morning got worse.  Rash is itchy and nonpainful.  Denies shortness of breath, nausea, vomiting, malaise, decreased oral intake, change in behavior.  No fevers.  She notes that he has been taking clindamycin for about a week for a productive cough.  No no creams, lotions, soaps, foods or other contacts.  Patient does take hydroxyzine 100 mg at bedtime.  No other antihistamines provided for rash.    Allergies  Allergen Reactions  . Ceclor [Cefaclor] Anaphylaxis  . Penicillins Anaphylaxis  . Clindamycin/Lincomycin Rash   Past Medical History:  Diagnosis Date  . Dementia   . Difficulty swallowing   . Down's syndrome   . Dysphagia causing pulmonary aspiration with swallowing   . GERD (gastroesophageal reflux disease)   . Headache(784.0)   . Hydrocephalus   . Insomnia   . Memory loss   . OSA (obstructive sleep apnea)   . Pituitary adenoma ([place])   . Schizoaffective disorder   . Weakness    Family History  Problem Relation Age of Onset  . Hypertension Mother   . Renal Disease Father   . Emphysema Maternal Grandfather        Died at 35    Current Outpatient Prescriptions:  .  cholecalciferol (VITAMIN D) 1000 UNITS tablet, Take 6,000 Units by mouth daily. , Disp: , Rfl:  .  clobetasol (TEMOVATE) 0.05 % external solution, Apply 1 application topically 2 (two) times daily., Disp: 50 mL, Rfl: 5 .  donepezil (ARICEPT) 10 MG tablet, Take 1 tablet (10 mg total) by mouth at bedtime., Disp: 30 tablet, Rfl: 5 .  hydrOXYzine (VISTARIL) 50 MG capsule, TAKE 1 CAPSULE  THREE TIMES DAILY AS NEEDED FOR ANXIETY, Disp: 90 capsule, Rfl: 4 .  ipratropium-albuterol (DUONEB) 0.5-2.5 (3) MG/3ML SOLN, Take 3 mLs by nebulization every 6 (six) hours as needed. Dx 493.90, Disp: 360 mL, Rfl: 5 .  ipratropium-albuterol (DUONEB) 0.5-2.5 (3) MG/3ML SOLN, NEBULIZE 1 VIAL EVERY 6 HOURS AS NEEDED, Disp: 180 mL, Rfl: 1 .  Melatonin 10 MG TABS, Take 1 tablet by mouth at bedtime., Disp: , Rfl:  .  mometasone (ELOCON) 0.1 % cream, Apply 1 application topically daily., Disp: 50 g, Rfl: 5 .  paliperidone (INVEGA) 3 MG 24 hr tablet, Take 1 tablet (3 mg total) by mouth at bedtime., Disp: 30 tablet, Rfl: 6 .  predniSONE (DELTASONE) 10 MG tablet, Take 60mg  by mouth day 1, 50mg  day 2, 40mg  day 3, 30mg  day 4, 20mg  day 5, 10mg  day 6.  Then stop., Disp: 21 tablet, Rfl: 0 .  traZODone (DESYREL) 100 MG tablet, Take 3 tablets (300 mg total) by mouth at bedtime., Disp: 90 tablet, Rfl: 6 .  Vitamin D, Ergocalciferol, (DRISDOL) 50000 units CAPS capsule, TAKE 1 CAPSULE ONCE A WEEK, Disp: 12 capsule, Rfl: 0  Social Hx: non smoker. ROS: Per HPI  Objective: Office vital signs reviewed. BP (!) 97/57   Pulse 62   Temp 97.7 F (36.5 C) (Oral)   Ht 5' (1.524 m)   Wt 266 lb (  120.7 kg)   BMI 51.95 kg/m   Physical Examination:  General: Awake, alert, obese, syndromic facies, No acute distress HEENT: MMM, airway patent Cardio: regular rate and rhythm, S1S2 heard Pulm: clear to auscultation bilaterally, moving good air throughout, no wheezes, rhonchi or rales; normal work of breathing on room air Skin: dry; intact; widespread over face neck and back.  Maculopapular rash that is erythematous.  Rash is blanching.  No exudate.  Nontender. Neuro: Awake, alert, interacts with provider.  Assessment/ Plan: 31 y.o. male   1. Adverse effect of drug, initial encounter This appears to be a drug reaction to clindamycin.  I advised them to discontinue clindamycin.  His cardiopulmonary exam was unremarkable and  he does not need to continue antibiotics at this time.  I have added clindamycin to his allergy list.  His systolic blood pressure is slightly low; it appears that he has had similar readings in the past.  Likely a normal variant as he is asymptomatic and well-appearing.  Vital signs otherwise normal.  He was given a dose of IM depo-Medrol 80 mg in clinic.  He will start prednisone taper.  He will take hydroxyzine 50 mg every 8 hours for itching.  Strict return precautions and reasons for emergent evaluation in the emergency department review with patient.  They voiced understanding and will follow-up as needed.    2. Rash and nonspecific skin eruption   No orders of the defined types were placed in this encounter.  Meds ordered this encounter  Medications  . predniSONE (DELTASONE) 10 MG tablet    Sig: Take 60mg  by mouth day 1, 50mg  day 2, 40mg  day 3, 30mg  day 4, 20mg  day 5, 10mg  day 6.  Then stop.    Dispense:  21 tablet    Refill:  [place], DO [facility] [phone]

## 2017-02-26 NOTE — Addendum Note (Signed)
Addended byCarrolyn Leigh on: 02/26/2017 01:12 PM   Modules accepted: Orders

## 2017-02-26 NOTE — Patient Instructions (Addendum)
STOP the Clindamycin.    As we discussed, I would like you to use the hydroxyzine every 8 hours for itching.  He will take 1 capsule every 8 hours for the next couple of days until itching has improved.  He was given a dose of steroids via injection.  He may start the prednisone tomorrow morning.  If he develops any other worrisome symptoms we discussed, worsening rash, difficulty breathing, decreased consciousness please seek immediate medical attention in the emergency department.   Drug Rash A drug rash is a change in the color or texture of the skin that is caused by a drug. It can develop minutes, hours, or days after the person takes the drug. What are the causes? This condition is usually caused by a drug allergy. It can also be caused by exposure to sunlight after taking a drug that makes the skin sensitive to light. Drugs that commonly cause rashes include:  Penicillin.  Antibiotic medicines.  Medicines that treat seizures.  Medicines that treat cancer (chemotherapy).  Aspirin and other nonsteroidal anti-inflammatory drugs (NSAIDs).  Injectable dyes that contain iodine.  Insulin.  What are the signs or symptoms? Symptoms of this condition include:  Redness.  Tiny bumps.  Peeling.  Itching.  Itchy welts (hives).  Swelling.  The rash may appear on a small area of skin or all over the body. How is this diagnosed? To diagnose the condition, your health care provider will do a physical exam. He or she may also order tests to find out which drug caused the rash. Tests to find the cause of a rash include:  Skin tests.  Blood tests.  Drug challenge. For this test, you stop taking all of the drugs that you do not need to take, and then you start taking them again by adding back one of the drugs at a time.  How is this treated? A drug rash may be treated with medicines, including:  Antihistamines. These may be given to relieve itching.  An NSAID. This may be given  to reduce swelling and treat pain.  A steroid drug. This may be given to reduce swelling.  The rash usually goes away when the person stops taking the drug that caused it. Follow these instructions at home:  Take medicines only as directed by your health care provider.  Let all of your health care providers know about any drug reactions you have had in the past.  If you have hives, take a cool shower or use a cool compress to relieve itchiness. Contact a health care provider if:  You have a fever.  Your rash is not going away.  Your rash gets worse.  Your rash comes back.  You have wheezing or coughing. Get help right away if:  You start to have breathing problems.  You start to have shortness of breath.  You face or throat starts to swell.  You have severe weakness with dizziness or fainting.  You have chest pain. This information is not intended to replace advice given to you by your health care provider. Make sure you discuss any questions you have with your health care provider. Document Released: 05/30/2004 Document Revised: 09/28/2015 Document Reviewed: 02/16/2014 Elsevier Interactive Patient Education  [employer].

## 2017-03-26 ENCOUNTER — Other Ambulatory Visit (INDEPENDENT_AMBULATORY_CARE_PROVIDER_SITE_OTHER): Payer: Self-pay | Admitting: Pediatrics

## 2017-03-26 DIAGNOSIS — F02818 Dementia in other diseases classified elsewhere, unspecified severity, with other behavioral disturbance: Secondary | ICD-10-CM

## 2017-03-26 DIAGNOSIS — F0281 Dementia in other diseases classified elsewhere with behavioral disturbance: Secondary | ICD-10-CM

## 2017-03-31 DIAGNOSIS — I423 Endomyocardial (eosinophilic) disease: Secondary | ICD-10-CM | POA: Diagnosis not present

## 2017-03-31 DIAGNOSIS — H6692 Otitis media, unspecified, left ear: Secondary | ICD-10-CM | POA: Diagnosis not present

## 2017-03-31 DIAGNOSIS — R05 Cough: Secondary | ICD-10-CM | POA: Diagnosis not present

## 2017-03-31 DIAGNOSIS — J209 Acute bronchitis, unspecified: Secondary | ICD-10-CM | POA: Diagnosis not present
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
## 2017-04-08 ENCOUNTER — Encounter: Payer: Self-pay | Admitting: Family Medicine

## 2017-04-08 ENCOUNTER — Ambulatory Visit (INDEPENDENT_AMBULATORY_CARE_PROVIDER_SITE_OTHER): Payer: Medicare Other | Admitting: Family Medicine

## 2017-04-08 VITALS — BP 105/56 | HR 69 | Temp 97.0°F | Ht 60.0 in | Wt 260.0 lb

## 2017-04-08 DIAGNOSIS — M62838 Other muscle spasm: Secondary | ICD-10-CM
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
## 2017-05-01 ENCOUNTER — Other Ambulatory Visit (INDEPENDENT_AMBULATORY_CARE_PROVIDER_SITE_OTHER): Payer: Self-pay | Admitting: Pediatrics

## 2017-05-01 ENCOUNTER — Encounter (INDEPENDENT_AMBULATORY_CARE_PROVIDER_SITE_OTHER): Payer: Self-pay

## 2017-05-01 NOTE — Telephone Encounter (Signed)
A user error has taken place: encounter opened in error, closed for administrative reasons.

## 2017-05-05 ENCOUNTER — Emergency Department (HOSPITAL_COMMUNITY): Payer: Medicare Other

## 2017-05-05 ENCOUNTER — Encounter (HOSPITAL_COMMUNITY): Payer: Self-pay | Admitting: Internal Medicine

## 2017-05-05 ENCOUNTER — Emergency Department (HOSPITAL_COMMUNITY)
Admission: EM | Admit: 2017-05-05 | Discharge: 2017-05-05 | Disposition: A | Discharge status: Home / Self Care | Payer: Medicare Other | Attending: Emergency Medicine | Admitting: Emergency Medicine

## 2017-05-05 DIAGNOSIS — J9600 Acute respiratory failure, unspecified whether with hypoxia or hypercapnia: Secondary | ICD-10-CM | POA: Diagnosis not present

## 2017-05-05 DIAGNOSIS — F0390 Unspecified dementia without behavioral disturbance: Secondary | ICD-10-CM | POA: Insufficient documentation

## 2017-05-05 DIAGNOSIS — Z880 Allergy status to penicillin: Secondary | ICD-10-CM | POA: Insufficient documentation

## 2017-05-05 DIAGNOSIS — R4020 Unspecified coma: Secondary | ICD-10-CM | POA: Diagnosis not present

## 2017-05-05 DIAGNOSIS — Q909 Down syndrome, unspecified: Secondary | ICD-10-CM | POA: Insufficient documentation

## 2017-05-05 DIAGNOSIS — R0600 Dyspnea, unspecified: Secondary | ICD-10-CM | POA: Diagnosis not present

## 2017-05-05 DIAGNOSIS — Z79899 Other long term (current) drug therapy: Secondary | ICD-10-CM | POA: Insufficient documentation

## 2017-05-05 DIAGNOSIS — R05 Cough: Secondary | ICD-10-CM | POA: Insufficient documentation

## 2017-05-05 DIAGNOSIS — R0981 Nasal congestion: Secondary | ICD-10-CM | POA: Insufficient documentation

## 2017-05-05 DIAGNOSIS — R067 Sneezing: Secondary | ICD-10-CM | POA: Diagnosis not present

## 2017-05-05 DIAGNOSIS — R0681 Apnea, not elsewhere classified: Secondary | ICD-10-CM | POA: Diagnosis not present

## 2017-05-05 DIAGNOSIS — R0902 Hypoxemia: Secondary | ICD-10-CM | POA: Diagnosis not present

## 2017-05-05 DIAGNOSIS — R0602 Shortness of breath: Secondary | ICD-10-CM | POA: Diagnosis not present

## 2017-05-05 LAB — CBC WITH DIFFERENTIAL/PLATELET
BASOS ABS: 0.1 10*3/uL (ref 0.0–0.1)
BASOS PCT: 1 %
Eosinophils Absolute: 0.1 10*3/uL (ref 0.0–0.7)
Eosinophils Relative: 0 %
HEMATOCRIT: 44.8 % (ref 39.0–52.0)
Hemoglobin: 14.7 g/dL (ref 13.0–17.0)
LYMPHS PCT: 22 %
Lymphs Abs: 2.7 10*3/uL (ref 0.7–4.0)
MCH: 31.6 pg (ref 26.0–34.0)
MCHC: 32.8 g/dL (ref 30.0–36.0)
MCV: 96.3 fL (ref 78.0–100.0)
MONO ABS: 0.8 10*3/uL (ref 0.1–1.0)
Monocytes Relative: 7 %
NEUTROS ABS: 8.5 10*3/uL — AB (ref 1.7–7.7)
NEUTROS PCT: 70 %
PLATELETS: 265 10*3/uL (ref 150–400)
RBC: 4.65 MIL/uL (ref 4.22–5.81)
RDW: 15.7 % — AB (ref 11.5–15.5)
WBC: 12.1 10*3/uL — AB (ref 4.0–10.5)

## 2017-05-05 LAB — BASIC METABOLIC PANEL
ANION GAP: 8 (ref 5–15)
BUN: 17 mg/dL (ref 6–20)
CALCIUM: 8.9 mg/dL (ref 8.9–10.3)
CO2: 31 mmol/L (ref 22–32)
Chloride: 100 mmol/L — ABNORMAL LOW (ref 101–111)
Creatinine, Ser: 1.15 mg/dL (ref 0.61–1.24)
Glucose, Bld: 106 mg/dL — ABNORMAL HIGH (ref 65–99)
POTASSIUM: 3.9 mmol/L (ref 3.5–5.1)
Sodium: 139 mmol/L (ref 135–145)

## 2017-05-05 LAB — BRAIN NATRIURETIC PEPTIDE: B NATRIURETIC PEPTIDE 5: 36.8 pg/mL (ref 0.0–100.0)

## 2017-05-05 MED ORDER — ALBUTEROL SULFATE (2.5 MG/3ML) 0.083% IN NEBU
5.0000 mg | INHALATION_SOLUTION | Freq: Once | RESPIRATORY_TRACT | Status: AC
  Administered 2017-05-05: 5 mg via RESPIRATORY_TRACT
  Filled 2017-05-05: qty 6

## 2017-05-05 MED ORDER — DOXYCYCLINE HYCLATE 100 MG PO CAPS: 100.0000 mg | ORAL_CAPSULE | Freq: Two times a day (BID) | ORAL | 0 refills | Status: DC

## 2017-05-05 NOTE — ED Provider Notes (Signed)
[facility] DEPT Provider Note   CSN: [id number] Arrival date & time: 05/05/17  1215 5 caveat dementia   History   Chief Complaint Chief Complaint  Patient presents with  . Shortness of Breath    HPI Sergio [name] is a 31 y.o. male.  She has had "a cold" with cough sneeze and congestion in his head for the past 2 weeks.  He was treated with DuoNeb at home last time yesterday with improvement of breathing.  No fever.  He went to an urgent care center earlier today noted to have low pulse ox of 83% on room air.  Brought by EMS treated with oxygen 2 L EMS.  HPI  Past Medical History:  Diagnosis Date  . Dementia   . Difficulty swallowing   . Down's syndrome   . Dysphagia causing pulmonary aspiration with swallowing   . GERD (gastroesophageal reflux disease)   . Headache(784.0)   . Hydrocephalus   . Insomnia   . Memory loss   . OSA (obstructive sleep apnea)   . Pituitary adenoma ([place])   . Schizoaffective disorder   . Weakness     Patient Active Problem List   Diagnosis Date Noted  . Sleep arousal disorder 07/12/2015  . Trisomy 21 12/08/2013  . Congenital hydrocephalus ([place]) 12/08/2013  . Tinea pedis 05/11/2013  . Severe obesity (BMI >= 40) ([place]) 04/05/2013  . OSA (obstructive sleep apnea)   . Pituitary adenoma ([place])   . Recurrent aspiration pneumonia ([place]) 09/23/2012  . Hypersomnia with sleep apnea 09/23/2012  . Ingrown nail 09/23/2012  . Unspecified asthma(493.90) 09/23/2012  . Adverse effect of anterior pituitary hormone 09/02/2012  . Headache(784.0) 09/02/2012  . Schizophrenia ([place]) 09/02/2012  . Dementia due to medical condition with behavioral disturbance 09/02/2012  . Shortness of breath 07/05/2012  . Pneumonia 07/06/2011  . Fever 07/06/2011  . GERD (gastroesophageal reflux disease) 07/06/2011    Past Surgical History:  Procedure Laterality Date  . BRAIN SURGERY    . CSF SHUNT    . TYMPANOSTOMY TUBE PLACEMENT  Bilateral   . VENTRICULOPERITONEAL SHUNT         Home Medications    Prior to Admission medications   Medication Sig Start Date End Date Taking? Authorizing Provider  clobetasol (TEMOVATE) 0.05 % external solution Apply 1 application topically 2 (two) times daily. 01/14/17  Yes Terald Sleeper, PA-C  donepezil (ARICEPT) 10 MG tablet TAKE ONE TABLET AT BEDTIME 03/26/17  Yes Hickling, Princess Bruins, MD  hydrOXYzine (VISTARIL) 50 MG capsule TAKE 1 CAPSULE THREE TIMES DAILY AS NEEDED FOR ANXIETY Patient taking differently: Take 100 mg by mouth at bedtime.  11/21/16  Yes Hampton Abbot, MD  ipratropium-albuterol (DUONEB) 0.5-2.5 (3) MG/3ML SOLN Take 3 mLs by nebulization every 6 (six) hours as needed. Dx 493.90 06/14/16  Yes Claretta Fraise, MD  Melatonin 10 MG TABS Take 1 tablet by mouth at bedtime.   Yes [provider]  mometasone (ELOCON) 0.1 % cream Apply 1 application topically daily. 01/14/17  Yes Terald Sleeper, PA-C  paliperidone (INVEGA) 3 MG 24 hr tablet Take 1 tablet (3 mg total) by mouth at bedtime. 11/21/16  Yes Hampton Abbot, MD  traZODone (DESYREL) 100 MG tablet Take 3 tablets (300 mg total) by mouth at bedtime. Patient taking differently: Take 200 mg by mouth at bedtime.  11/21/16  Yes Hampton Abbot, MD  cholecalciferol (VITAMIN D) 1000 UNITS tablet Take 6,000 Units by mouth daily.     [provider]  donepezil (ARICEPT) 10 MG tablet TAKE ONE TABLET AT BEDTIME Patient not taking: Reported on 05/05/2017 05/01/17   Jodi Geralds, MD  ipratropium-albuterol (DUONEB) 0.5-2.5 (3) MG/3ML SOLN NEBULIZE 1 VIAL EVERY 6 HOURS AS NEEDED Patient not taking: Reported on 05/05/2017 06/26/16   Sharion Balloon, FNP  Vitamin D, Ergocalciferol, (DRISDOL) 50000 units CAPS capsule TAKE 1 CAPSULE ONCE A WEEK 11/21/16   Sharion Balloon, FNP    Family History Family History  Problem Relation Age of Onset  . Hypertension Mother   . Renal Disease Father   . Emphysema Maternal  Grandfather        Died at 11    Social History Social History   Tobacco Use  . Smoking status: Never Smoker  . Smokeless tobacco: Never Used  Substance Use Topics  . Alcohol use: No  . Drug use: No     Allergies   Cefaclor; Penicillins; Clindamycin; and Clindamycin/lincomycin   Review of Systems Review of Systems  HENT: Positive for congestion and sneezing.   Respiratory: Positive for cough.      Physical Exam Updated Vital Signs BP 133/70   Pulse 80   Temp 98.2 F (36.8 C) (Oral)   Resp (!) 33   SpO2 95%   Physical Exam  Constitutional:  Chronically ill-appearing.  No respiratory distress  HENT:  Right Ear: External ear normal.  Left Ear: External ear normal.  Bilateral tympanic membranes normal  Eyes: Conjunctivae are normal. Pupils are equal, round, and reactive to light.  Neck: Neck supple. No tracheal deviation present. No thyromegaly present.  Cardiovascular: Normal rate and regular rhythm.  No murmur heard. Pulmonary/Chest: Effort normal and breath sounds normal.  Diffuse scant Rales.  No respiratory distress  Abdominal: Soft. Bowel sounds are normal. He exhibits no distension. There is no tenderness.  Morbidly obese  Musculoskeletal: Normal range of motion. He exhibits no edema or tenderness.  Neurological: He is alert. Coordination normal.  Skin: Skin is warm and dry. No rash noted.  Psychiatric: He has a normal mood and affect.  Nursing note and vitals reviewed.    ED Treatments / Results  Labs (all labs ordered are listed, but only abnormal results are displayed) Labs Reviewed  BLOOD GAS, ARTERIAL  BRAIN NATRIURETIC PEPTIDE  CBC WITH DIFFERENTIAL/PLATELET  BASIC METABOLIC PANEL    EKG  EKG Interpretation  Date/Time:  Monday May 05 2017 12:35:24 EST Ventricular Rate:  63 PR Interval:    QRS Duration: 106 QT Interval:  410 QTC Calculation: 423 R Axis:   108 Text Interpretation:  Sinus rhythm Probable right ventricular  hypertrophy No significant change since last tracing Confirmed by Orlie Dakin [phone]) on 05/05/2017 2:24:33 PM      Results for orders placed or performed during the hospital encounter of 05/05/17  CBC with Differential/Platelet  Result Value Ref Range   WBC 12.1 (H) 4.0 - 10.5 K/uL   RBC 4.65 4.22 - 5.81 MIL/uL   Hemoglobin 14.7 13.0 - 17.0 g/dL   HCT 44.8 39.0 - 52.0 %   MCV 96.3 78.0 - 100.0 fL   MCH 31.6 26.0 - 34.0 pg   MCHC 32.8 30.0 - 36.0 g/dL   RDW 15.7 (H) 11.5 - 15.5 %   Platelets 265 150 - 400 K/uL   Neutrophils Relative % 70 %   Neutro Abs 8.5 (H) 1.7 - 7.7 K/uL   Lymphocytes Relative 22 %   Lymphs Abs 2.7 0.7 - 4.0 K/uL  Monocytes Relative 7 %   Monocytes Absolute 0.8 0.1 - 1.0 K/uL   Eosinophils Relative 0 %   Eosinophils Absolute 0.1 0.0 - 0.7 K/uL   Basophils Relative 1 %   Basophils Absolute 0.1 0.0 - 0.1 K/uL   Ct Head Wo Contrast  Result Date: 05/05/2017 CLINICAL DATA:  Altered level of consciousness.  Ventricular shunt. EXAM: CT HEAD WITHOUT CONTRAST TECHNIQUE: Contiguous axial images were obtained from the base of the skull through the vertex without intravenous contrast. COMPARISON:  CT head 02/11/2013, MRI 03/14/2010 FINDINGS: Brain: Right parietal shunt catheter in the right lateral ventricle unchanged. Negative for hydrocephalus. Partial agenesis of the corpus callosum. Negative for acute infarct, hemorrhage, or mass. Vascular: Negative for hyperdense vessel Skull: No acute abnormality. Sinuses/Orbits: Mild mucosal edema paranasal sinuses.  Normal orbit Other: None IMPRESSION: No acute abnormality no change from prior studies. Ventricular shunt unchanged in position. Negative for hydrocephalus. Electronically Signed   By: Franchot Gallo M.D.   On: 05/05/2017 16:01   Dg Chest Portable 1 View  Result Date: 05/05/2017 CLINICAL DATA:  Cough. EXAM: PORTABLE CHEST 1 VIEW COMPARISON:  03/31/2017 FINDINGS: Lung volumes are low. Partial right  ventriculoperitoneal shunt tubing identified. Pulmonary vascular congestion is noted bilaterally. No airspace consolidation. IMPRESSION: 1. Decreased lung volumes. 2. Pulmonary vascular congestion. Electronically Signed   By: Kerby Moors M.D.   On: 05/05/2017 13:02    Radiology Dg Chest Portable 1 View  Result Date: 05/05/2017 CLINICAL DATA:  Cough. EXAM: PORTABLE CHEST 1 VIEW COMPARISON:  03/31/2017 FINDINGS: Lung volumes are low. Partial right ventriculoperitoneal shunt tubing identified. Pulmonary vascular congestion is noted bilaterally. No airspace consolidation. IMPRESSION: 1. Decreased lung volumes. 2. Pulmonary vascular congestion. Electronically Signed   By: Kerby Moors M.D.   On: 05/05/2017 13:02    Procedures Procedures (including critical care time)  Medications Ordered in ED Medications  albuterol (PROVENTIL) (2.5 MG/3ML) 0.083% nebulizer solution 5 mg (5 mg Nebulization Given 05/05/17 1250)     Initial Impression / Assessment and Plan / ED Course  I have reviewed the triage vital signs and the nursing notes.  Pertinent labs & imaging results that were available during my care of the patient were reviewed by me and considered in my medical decision making (see chart for details).      low Pulse ox urgent care felt to be factitious.  Chest x-ray reviewed by me. Pt signed out to Dr [id number] pm symptoms likely viral in etiology.  Given nasal congestion, cough and sneeze.  No signs of hydrocephalus  Final Clinical Impressions(s) / ED Diagnoses   Final diagnoses:  None  Diagnosis dyspnea  ED Discharge Orders    None       Orlie Dakin, MD 05/05/17 [phone]

## 2017-05-05 NOTE — ED Notes (Signed)
Patient transported to CT 

## 2017-05-05 NOTE — ED Notes (Signed)
Spoke with lab regarding pending BNP. Lab stated that the result is still pending, but that they are running the lab stat. Patient's father becoming frustrated. This RN attempted to calm family member down and explained delay.

## 2017-05-05 NOTE — ED Notes (Signed)
Bed: VX48 Expected date:  Expected time:  Means of arrival:  Comments: 31 yo sob

## 2017-05-05 NOTE — ED Triage Notes (Addendum)
Patient arrived via [facility] from home with shortness of breath. Patient was seen at urgent care this morning and sent to ED for further evaluation. Per EMS, patient reports pulmonary congestion x2 days and non compliance with CPAP at night x2 days. Mother brought patient to urgent care where O2 sat was 83% RA. Placed on 2L O2 and O2 sat came up to 92%.   Patient given duoneb treatment by mother last night. Has not received one today.

## 2017-05-05 NOTE — Discharge Instructions (Addendum)
Have Pidcock use his DuoNeb every 4-6 hours as needed for shortness of breath.  Return if needed more than every 4 hours or see his doctor,   follow up with your md next week if not improving

## 2017-05-05 NOTE — ED Notes (Signed)
Portable chest x-ray at the bedside.  

## 2017-05-15 ENCOUNTER — Telehealth: Payer: Self-pay | Admitting: Family

## 2017-05-16 NOTE — Telephone Encounter (Signed)
Patient has a follow up appointment scheduled. 
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
## 2017-05-22 ENCOUNTER — Other Ambulatory Visit: Payer: Self-pay

## 2017-05-22 ENCOUNTER — Ambulatory Visit (INDEPENDENT_AMBULATORY_CARE_PROVIDER_SITE_OTHER): Payer: Medicare Other

## 2017-05-22 ENCOUNTER — Ambulatory Visit (INDEPENDENT_AMBULATORY_CARE_PROVIDER_SITE_OTHER): Payer: Medicare Other | Admitting: Family Medicine

## 2017-05-22 ENCOUNTER — Encounter: Payer: Self-pay | Admitting: Family Medicine

## 2017-05-22 ENCOUNTER — Encounter (HOSPITAL_COMMUNITY): Payer: Self-pay | Admitting: Emergency Medicine

## 2017-05-22 ENCOUNTER — Emergency Department (HOSPITAL_COMMUNITY): Payer: Medicare Other

## 2017-05-22 ENCOUNTER — Emergency Department (HOSPITAL_COMMUNITY)
Admission: EM | Admit: 2017-05-22 | Discharge: 2017-05-22 | Disposition: A | Discharge status: Home / Self Care | Payer: Medicare Other | Attending: Physician Assistant | Admitting: Physician Assistant

## 2017-05-22 VITALS — BP 106/71 | HR 83 | Temp 98.4°F | Ht 60.0 in | Wt 259.0 lb

## 2017-05-22 DIAGNOSIS — R05 Cough: Secondary | ICD-10-CM

## 2017-05-22 DIAGNOSIS — R059 Cough, unspecified: Secondary | ICD-10-CM

## 2017-05-22 DIAGNOSIS — M542 Cervicalgia: Secondary | ICD-10-CM

## 2017-05-22 DIAGNOSIS — S199XXA Unspecified injury of neck, initial encounter: Secondary | ICD-10-CM | POA: Diagnosis not present

## 2017-05-22 DIAGNOSIS — Z982 Presence of cerebrospinal fluid drainage device: Secondary | ICD-10-CM | POA: Insufficient documentation

## 2017-05-22 DIAGNOSIS — Q909 Down syndrome, unspecified: Secondary | ICD-10-CM | POA: Insufficient documentation

## 2017-05-22 DIAGNOSIS — Z79899 Other long term (current) drug therapy: Secondary | ICD-10-CM | POA: Diagnosis not present

## 2017-05-22 DIAGNOSIS — F209 Schizophrenia, unspecified: Secondary | ICD-10-CM | POA: Diagnosis not present

## 2017-05-22 DIAGNOSIS — R0902 Hypoxemia: Secondary | ICD-10-CM | POA: Diagnosis not present

## 2017-05-22 DIAGNOSIS — G8921 Chronic pain due to trauma: Secondary | ICD-10-CM | POA: Diagnosis not present

## 2017-05-22 MED ORDER — IPRATROPIUM-ALBUTEROL 0.5-2.5 (3) MG/3ML IN SOLN: 3.0000 mL | Freq: Four times a day (QID) | RESPIRATORY_TRACT | 5 refills | Status: DC | PRN

## 2017-05-22 NOTE — ED Notes (Signed)
Bed: WA07 Expected date:  Expected time:  Means of arrival:  Comments: [facility]
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
## 2017-05-22 NOTE — Discharge Instructions (Signed)
We are happy to report that your CAT scan is negative for fracture.
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
## 2017-05-26 ENCOUNTER — Other Ambulatory Visit (HOSPITAL_COMMUNITY): Payer: Self-pay | Admitting: Psychiatry

## 2017-05-26 ENCOUNTER — Other Ambulatory Visit (INDEPENDENT_AMBULATORY_CARE_PROVIDER_SITE_OTHER): Payer: Self-pay | Admitting: Pediatrics

## 2017-05-26 DIAGNOSIS — F209 Schizophrenia, unspecified: Secondary | ICD-10-CM

## 2017-06-25 ENCOUNTER — Other Ambulatory Visit (INDEPENDENT_AMBULATORY_CARE_PROVIDER_SITE_OTHER): Payer: Self-pay | Admitting: Pediatrics

## 2017-06-25 ENCOUNTER — Other Ambulatory Visit (HOSPITAL_COMMUNITY): Payer: Self-pay | Admitting: Psychiatry

## 2017-06-25 DIAGNOSIS — F209 Schizophrenia, unspecified: Secondary | ICD-10-CM

## 2017-06-30 ENCOUNTER — Ambulatory Visit (INDEPENDENT_AMBULATORY_CARE_PROVIDER_SITE_OTHER): Payer: Medicare Other | Admitting: Family Medicine

## 2017-06-30 ENCOUNTER — Encounter: Payer: Self-pay | Admitting: Family Medicine

## 2017-06-30 VITALS — BP 100/68 | HR 80 | Temp 98.4°F | Ht 60.0 in | Wt 260.5 lb

## 2017-06-30 DIAGNOSIS — J329 Chronic sinusitis, unspecified: Secondary | ICD-10-CM | POA: Diagnosis not present

## 2017-06-30 DIAGNOSIS — H1032 Unspecified acute conjunctivitis, left eye: Secondary | ICD-10-CM | POA: Diagnosis not present

## 2017-06-30 DIAGNOSIS — J40 Bronchitis, not specified as acute or chronic: Secondary | ICD-10-CM

## 2017-06-30 MED ORDER — CIPROFLOXACIN HCL 500 MG PO TABS: 500.0000 mg | ORAL_TABLET | Freq: Two times a day (BID) | ORAL | 0 refills | Status: DC

## 2017-06-30 MED ORDER — TOBRAMYCIN-DEXAMETHASONE 0.3-0.1 % OP SUSP
OPHTHALMIC | 0 refills | Status: DC
Start: 2017-06-30 — End: 2017-11-11

## 2017-06-30 NOTE — Progress Notes (Signed)
Chief Complaint  Patient presents with  . Conjunctivitis    pt here today c/o left eye itching and drainage    HPI  Patient presents today for several days of cough congestion and upper respiratory as well as chest congestion.  No fever chills or sweats.  However for the last 2 days he his parents noted that he has redness of the left eye and has had some mucoid drainage as well as scratching and rubbing frequently. PMH: Smoking status noted ROS: Per HPI  Objective: BP 100/68   Pulse 80   Temp 98.4 F (36.9 C) (Oral)   Ht 5' (1.524 m)   Wt 260 lb 8 oz (118.2 kg)   BMI 50.88 kg/m  Gen: NAD, alert, cooperative with exam HEENT: NCAT, EOMI, PERRL.  There is injection of the conjunctiva on the left with some dried mattering at the medial epicanthus. CV: RRR, good S1/S2, no murmur Resp: CTABL, some right lower lobe bronchitic character without overt rales rhonchi or wheezing Ext: No edema, warm Neuro: Alert, answers questions briefly but appropriately Assessment and plan:  1. Acute bacterial conjunctivitis of left eye   2. Sinobronchitis     Meds ordered this encounter  Medications  . tobramycin-dexamethasone (TOBRADEX) ophthalmic solution    Sig: Apply 1 drop in affected eye(s) every 2 hours for two days. Then every 4 hours for 5 days.    Dispense:  5 mL    Refill:  0  . ciprofloxacin (CIPRO) 500 MG tablet    Sig: Take 1 tablet (500 mg total) by mouth 2 (two) times daily.    Dispense:  14 tablet    Refill:  0    No orders of the defined types were placed in this encounter.   Follow up as needed.  Claretta Fraise, MD

## 2017-07-23 ENCOUNTER — Other Ambulatory Visit (HOSPITAL_COMMUNITY): Payer: Self-pay

## 2017-07-23 DIAGNOSIS — F209 Schizophrenia, unspecified: Secondary | ICD-10-CM

## 2017-07-23 MED ORDER — TRAZODONE HCL 100 MG PO TABS: 300.0000 mg | ORAL_TABLET | Freq: Every day | ORAL | 0 refills | Status: DC

## 2017-07-23 MED ORDER — PALIPERIDONE ER 3 MG PO TB24: 3.0000 mg | ORAL_TABLET | Freq: Every day | ORAL | 0 refills | Status: DC

## 2017-07-23 MED ORDER — HYDROXYZINE PAMOATE 50 MG PO CAPS: ORAL_CAPSULE | ORAL | 0 refills | Status: DC

## 2017-07-25 ENCOUNTER — Other Ambulatory Visit (INDEPENDENT_AMBULATORY_CARE_PROVIDER_SITE_OTHER): Payer: Self-pay | Admitting: Pediatrics

## 2017-08-21 ENCOUNTER — Other Ambulatory Visit (HOSPITAL_COMMUNITY): Payer: Self-pay | Admitting: Psychiatry

## 2017-08-21 ENCOUNTER — Ambulatory Visit (INDEPENDENT_AMBULATORY_CARE_PROVIDER_SITE_OTHER): Payer: Medicare Other | Admitting: Psychiatry

## 2017-08-21 ENCOUNTER — Other Ambulatory Visit (INDEPENDENT_AMBULATORY_CARE_PROVIDER_SITE_OTHER): Payer: Self-pay | Admitting: Pediatrics

## 2017-08-21 ENCOUNTER — Encounter (HOSPITAL_COMMUNITY): Payer: Self-pay | Admitting: Psychiatry

## 2017-08-21 VITALS — BP 122/74 | HR 94 | Ht 60.0 in | Wt 265.0 lb

## 2017-08-21 DIAGNOSIS — F0281 Dementia in other diseases classified elsewhere with behavioral disturbance: Secondary | ICD-10-CM

## 2017-08-21 DIAGNOSIS — M549 Dorsalgia, unspecified: Secondary | ICD-10-CM

## 2017-08-21 DIAGNOSIS — G4700 Insomnia, unspecified: Secondary | ICD-10-CM

## 2017-08-21 DIAGNOSIS — F209 Schizophrenia, unspecified: Secondary | ICD-10-CM | POA: Diagnosis not present

## 2017-08-21 DIAGNOSIS — F02818 Dementia in other diseases classified elsewhere, unspecified severity, with other behavioral disturbance: Secondary | ICD-10-CM

## 2017-08-21 DIAGNOSIS — K219 Gastro-esophageal reflux disease without esophagitis: Secondary | ICD-10-CM | POA: Diagnosis not present

## 2017-08-21 DIAGNOSIS — J690 Pneumonitis due to inhalation of food and vomit: Secondary | ICD-10-CM

## 2017-08-21 DIAGNOSIS — Q909 Down syndrome, unspecified: Secondary | ICD-10-CM | POA: Diagnosis not present

## 2017-08-21 DIAGNOSIS — G919 Hydrocephalus, unspecified: Secondary | ICD-10-CM | POA: Diagnosis not present

## 2017-08-21 DIAGNOSIS — G4733 Obstructive sleep apnea (adult) (pediatric): Secondary | ICD-10-CM

## 2017-08-21 MED ORDER — PALIPERIDONE ER 3 MG PO TB24: 3.0000 mg | ORAL_TABLET | Freq: Every day | ORAL | 1 refills | Status: DC

## 2017-08-21 MED ORDER — TRAZODONE HCL 100 MG PO TABS: 300.0000 mg | ORAL_TABLET | Freq: Every day | ORAL | 0 refills | Status: DC

## 2017-08-21 MED ORDER — HYDROXYZINE PAMOATE 50 MG PO CAPS: ORAL_CAPSULE | ORAL | 0 refills | Status: DC

## 2017-08-21 NOTE — Patient Instructions (Signed)
Lipid panel, A1C, prolactin level to be done at annual visit with PCP. Need to be fasting

## 2017-08-21 NOTE — Progress Notes (Signed)
BH MD/PA/NP OP Progress Note  08/21/2017 1:35 PM Sergio [name]  MRN:  [id number]  Chief Complaint: I have pain all over, I should not have to go to daycare HPI: Patient is a 32 year old male diagnosed with schizophrenia disorder, Down syndrome, GERD, hydrocephalus and dementia who presents today for a follow-up visit  Dad reports that patient complains of pain all the time so as to not attend his daycare.  Dad states that patient goes to daycare 3 days a week, is active there as they require him to be and does not like it.  Patient states that he prefers to stay at home, on being questioned where the pain was, patient pointed to his stomach, and then his eye, his back.  He adds that he should not have to go to the daycare tomorrow because if it.  On being questioned what patient enjoys, he adds that he likes staying home, spending time with his family.  Dad reports that it is very hard to get patient to leave his room, do any activities around the house on the days that he does not attend daycare.  Patient states that he enjoys spending time with his nieces, his sister.  On being questioned about the voices, patient states that he still hears the rear doors, has had them for many years now but that they are more friendly towards him and do not bother him.  In regards to his sleep, dad reports that patient continues to wake up multiple times during the night, partly because he sleeps a lot during the day, has sleep apnea and does not use the machine all the time. Dad states that they are trying to work with patient in regards to this  In regards to his breathing, patient was wheezing today, on being questioned about this, dad states that patient has an appointment with his PCP, reports his oxygen saturation is good.  Patient also continues to have thickened liquids so that he does not aspirate  In regards to his eating habits, dad reports that they continue to struggle with patient making good  choices, exercising.  They both deny any side effects with the medications, any safety concerns at this visit.     Visit Diagnosis:    ICD-10-CM   1. Down's syndrome Q90.9   2. Schizophrenia, unspecified type ([place]) F20.9 traZODone (DESYREL) 100 MG tablet    paliperidone (INVEGA) 3 MG 24 hr tablet    hydrOXYzine (VISTARIL) 50 MG capsule  3. Dementia due to medical condition with behavioral disturbance F02.81     Past Psychiatric History: Unchanged from previous visit  Past Medical History:  Past Medical History:  Diagnosis Date  . Dementia   . Difficulty swallowing   . Down's syndrome   . Dysphagia causing pulmonary aspiration with swallowing   . GERD (gastroesophageal reflux disease)   . Headache(784.0)   . Hydrocephalus   . Insomnia   . Memory loss   . OSA (obstructive sleep apnea)   . Pituitary adenoma ([place])   . Schizoaffective disorder   . Weakness     Past Surgical History:  Procedure Laterality Date  . BRAIN SURGERY    . CSF SHUNT    . TYMPANOSTOMY TUBE PLACEMENT Bilateral   . VENTRICULOPERITONEAL SHUNT      Family Psychiatric History: As mentioned below  Family History:  Family History  Problem Relation Age of Onset  . Hypertension Mother   . Renal Disease Father   . Emphysema Maternal Grandfather  Died at 29    Social History:  Social History   Socioeconomic History  . Marital status: Single    Spouse name: Not on file  . Number of children: Not on file  . Years of education: Not on file  . Highest education level: Not on file  Occupational History  . Not on file  Social Needs  . Financial resource strain: Not on file  . Food insecurity:    Worry: Not on file    Inability: Not on file  . Transportation needs:    Medical: Not on file    Non-medical: Not on file  Tobacco Use  . Smoking status: Never Smoker  . Smokeless tobacco: Never Used  Substance and Sexual Activity  . Alcohol use: No  . Drug use: No  . Sexual activity:  Never  Lifestyle  . Physical activity:    Days per week: Not on file    Minutes per session: Not on file  . Stress: Not on file  Relationships  . Social connections:    Talks on phone: Not on file    Gets together: Not on file    Attends religious service: Not on file    Active member of club or organization: Not on file    Attends meetings of clubs or organizations: Not on file    Relationship status: Not on file  Other Topics Concern  . Not on file  Social History Narrative   Sergio [name] is a 32 year old male.   He lives with both of his parents. He has 1 sister,who is 32.    He enjoys making his bed and watching movies. He has 700 movies.     Allergies:  Allergies  Allergen Reactions  . Cefaclor Anaphylaxis and Shortness Of Breath  . Penicillins Anaphylaxis and Rash    Has patient had a PCN reaction causing immediate rash, facial/tongue/throat swelling, SOB or lightheadedness with hypotension: yes Has patient had a PCN reaction causing severe rash involving mucus membranes or skin necrosis: no Has patient had a PCN reaction that required hospitalization: no Has patient had a PCN reaction occurring within the last 10 years: no If all of the above answers are "NO", then may proceed with Cephalosporin use.   . Clindamycin Rash  . Clindamycin/Lincomycin Rash    Metabolic Disorder Labs: No results found for: HGBA1C, MPG Lab Results  Component Value Date   PROLACTIN 51.5 (H) 01/21/2013   No results found for: CHOL, TRIG, HDL, CHOLHDL, VLDL, LDLCALC Lab Results  Component Value Date   TSH 3.020 01/21/2013    Therapeutic Level Labs: No results found for: LITHIUM No results found for: VALPROATE No components found for:  CBMZ  Current Medications: Current Outpatient Medications  Medication Sig Dispense Refill  . ciprofloxacin (CIPRO) 500 MG tablet Take 1 tablet (500 mg total) by mouth 2 (two) times daily. 14 tablet 0  . donepezil (ARICEPT) 10 MG tablet TAKE ONE TABLET AT  BEDTIME 30 tablet 0  . donepezil (ARICEPT) 10 MG tablet TAKE ONE TABLET AT BEDTIME 30 tablet 0  . ipratropium-albuterol (DUONEB) 0.5-2.5 (3) MG/3ML SOLN Take 3 mLs by nebulization every 6 (six) hours as needed. Dx 493.90 (Patient taking differently: Take 3 mLs by nebulization every 6 (six) hours as needed (SOB, wheezing). Dx 493.90) 360 mL 5  . Melatonin 10 MG TABS Take 10 mg by mouth at bedtime.     . paliperidone (INVEGA) 3 MG 24 hr tablet Take 1 tablet (3 mg  total) by mouth at bedtime. 30 tablet 0  . tobramycin-dexamethasone (TOBRADEX) ophthalmic solution Apply 1 drop in affected eye(s) every 2 hours for two days. Then every 4 hours for 5 days. 5 mL 0  . traZODone (DESYREL) 100 MG tablet Take 3 tablets (300 mg total) by mouth at bedtime. 90 tablet 0  . Vitamin D, Ergocalciferol, (DRISDOL) 50000 units CAPS capsule TAKE 1 CAPSULE ONCE A WEEK 12 capsule 0  . hydrOXYzine (VISTARIL) 50 MG capsule Take one capsule three times daily as needed for anxiety 90 capsule 0   No current facility-administered medications for this visit.      Musculoskeletal: Strength & Muscle Tone: within normal limits Gait & Station: broad based Patient leans: N/A  Psychiatric Specialty Exam: Review of Systems  Constitutional: Positive for malaise/fatigue. Negative for chills, diaphoresis, fever and weight loss.  HENT: Negative.  Negative for congestion, hearing loss and sore throat.   Eyes: Negative.  Negative for blurred vision, double vision, discharge and redness.  Respiratory: Positive for shortness of breath and wheezing. Negative for cough and sputum production.   Cardiovascular: Negative for chest pain and palpitations.  Gastrointestinal: Negative.  Negative for abdominal pain, heartburn, nausea and vomiting.  Musculoskeletal: Positive for back pain. Negative for falls, myalgias and neck pain.  Skin: Negative for rash.  Neurological: Negative.  Negative for dizziness, speech change, focal weakness,  seizures, loss of consciousness and headaches.  Endo/Heme/Allergies: Negative.  Negative for environmental allergies.  Psychiatric/Behavioral: Positive for hallucinations. Negative for depression, substance abuse and suicidal ideas. The patient has insomnia. The patient is not nervous/anxious.     Blood pressure 122/74, pulse 94, height 5' (1.524 m), weight 265 lb (120.2 kg).Body mass index is 51.75 kg/m.  General Appearance: Casual  Eye Contact:  Fair  Speech:  Clear and Coherent and Normal Rate  Volume:  Normal  Mood:  Euthymic  Affect:  Congruent and Full Range  Thought Process:  Coherent, Goal Directed and Descriptions of Associations: Intact  Orientation:  Other:  place and person  Thought Content: Delusions and Hallucinations: Auditory   Suicidal Thoughts:  No  Homicidal Thoughts:  No  Memory:  Immediate;   Fair Recent;   Fair Remote;   Fair  Judgement:  Impaired  Insight:  Lacking  Psychomotor Activity:  Decreased and Mannerisms  Concentration:  Concentration: Fair and Attention Span: Fair  Recall:  [employer] of Knowledge: Poor  Language: Poor  Akathisia:  No  Handed:  Right  AIMS (if indicated): not done  Assets:  Financial Resources/Insurance Housing Social Support Transportation  ADL's:  Impaired  Cognition: Impaired,  Moderate  Sleep:  Poor   Screenings: PHQ2-9     Office Visit from confidential encounter on 06/30/2017 Office Visit from confidential encounter on 02/26/2017 Office Visit from confidential encounter on 06/14/2016 Office Visit from confidential encounter on 04/19/2015 Office Visit from confidential encounter on 01/13/2015  PHQ-2 Total Score  0  0  0  0  0       Assessment and Plan:  Schizophrenia  To continue Invega 3 mg 1 at night for psychosis.  Patient is to see primary care physician at the end of the month, list of labs which include hemoglobin A1c, lipid panel and prolactin level was given to dad so could be done by the PCP as he has an  appointment at the end of the month To use Vistaril 50 mg 3 times a day as needed for agitation Insomnia Continue trazodone 300 mg  at bedtime for sleep Continue melatonin 10 mg at sunset to help patient with his sleep-wake cycle Discussed again and then sleep hygiene, the need to keep patient busy and out of his room during the day as when patient is in room, he ends up sleeping most of the day.  Also discussed a reward system to help patient with this Obstructive sleep apnea Continue to use CPAP daily at night Dementia  Continue Aricept 10 mg daily for dementia Aspiration pneumonia  Continue to take thickened liquids to prevent aspiration Down syndrome Patient attending adult daycare center 3 days a week and mom providing some of the Services for patient  Call as needed Follow-up in 6 months   Hampton Abbot, MD 08/21/2017, 1:36 PM

## 2017-09-18 ENCOUNTER — Other Ambulatory Visit (HOSPITAL_COMMUNITY): Payer: Self-pay | Admitting: Psychiatry

## 2017-09-18 DIAGNOSIS — F209 Schizophrenia, unspecified: Secondary | ICD-10-CM

## 2017-09-22 ENCOUNTER — Other Ambulatory Visit (INDEPENDENT_AMBULATORY_CARE_PROVIDER_SITE_OTHER): Payer: Self-pay | Admitting: Pediatrics

## 2017-10-15 ENCOUNTER — Other Ambulatory Visit (INDEPENDENT_AMBULATORY_CARE_PROVIDER_SITE_OTHER): Payer: Self-pay | Admitting: Pediatrics

## 2017-10-16 ENCOUNTER — Other Ambulatory Visit (HOSPITAL_COMMUNITY): Payer: Self-pay | Admitting: Psychiatry

## 2017-10-22 ENCOUNTER — Other Ambulatory Visit (HOSPITAL_COMMUNITY): Payer: Self-pay

## 2017-10-22 DIAGNOSIS — F209 Schizophrenia, unspecified: Secondary | ICD-10-CM

## 2017-10-22 MED ORDER — TRAZODONE HCL 100 MG PO TABS: 300.0000 mg | ORAL_TABLET | Freq: Every day | ORAL | 0 refills | Status: DC

## 2017-11-11 ENCOUNTER — Encounter: Payer: Self-pay | Admitting: Family Medicine

## 2017-11-11 ENCOUNTER — Ambulatory Visit (INDEPENDENT_AMBULATORY_CARE_PROVIDER_SITE_OTHER): Payer: Medicare Other | Admitting: Family Medicine

## 2017-11-11 VITALS — BP 110/62 | HR 64 | Temp 98.1°F | Ht 60.0 in | Wt 262.0 lb

## 2017-11-11 DIAGNOSIS — B372 Candidiasis of skin and nail: Secondary | ICD-10-CM

## 2017-11-11 MED ORDER — KETOCONAZOLE 2 % EX CREA: TOPICAL_CREAM | CUTANEOUS | 0 refills | Status: DC

## 2017-11-11 NOTE — Progress Notes (Signed)
Subjective: Sergio PCP: Sergio [name], Sergio [name] is a 32 y.o. male presenting to clinic today for:  1. Rash Patient is brought to the office by his father who notes he developed a rash in the right armpit and right groin about a week ago.  Has been getting worse and this is why they came to the office.  He has been applying over-the-counter topical antibiotic ointment to the affected areas with little improvement in symptoms.  He does note he tends to sweat quite a bit.  Denies any discharge, fevers, chills, bleeding.   ROS: Per HPI  Allergies  Allergen Reactions  . Cefaclor Anaphylaxis and Shortness Of Breath  . Penicillins Anaphylaxis and Rash    Has patient had a PCN reaction causing immediate rash, facial/tongue/throat swelling, SOB or lightheadedness with hypotension: yes Has patient had a PCN reaction causing severe rash involving mucus membranes or skin necrosis: no Has patient had a PCN reaction that required hospitalization: no Has patient had a PCN reaction occurring within the last 10 years: no If all of the above answers are "NO", then may proceed with Cephalosporin use.   . Clindamycin Rash  . Clindamycin/Lincomycin Rash   Past Medical History:  Diagnosis Date  . Dementia   . Difficulty swallowing   . Down's syndrome   . Dysphagia causing pulmonary aspiration with swallowing   . GERD (gastroesophageal reflux disease)   . Headache(784.0)   . Hydrocephalus   . Insomnia   . Memory loss   . OSA (obstructive sleep apnea)   . Pituitary adenoma ([place])   . Schizoaffective disorder   . Weakness     Current Outpatient Medications:  .  donepezil (ARICEPT) 10 MG tablet, TAKE ONE TABLET AT BEDTIME, Disp: 30 tablet, Rfl: 0 .  donepezil (ARICEPT) 10 MG tablet, TAKE ONE TABLET AT BEDTIME, Disp: 30 tablet, Rfl: 0 .  donepezil (ARICEPT) 10 MG tablet, TAKE ONE TABLET AT BEDTIME, Disp: 30 tablet, Rfl: 0 .  hydrOXYzine (VISTARIL) 50 MG capsule, TAKE 1  CAPSULE THREE TIMES DAILY AS NEEDED FOR ANXIETY, Disp: 90 capsule, Rfl: 0 .  ipratropium-albuterol (DUONEB) 0.5-2.5 (3) MG/3ML SOLN, Take 3 mLs by nebulization every 6 (six) hours as needed. Dx 493.90 (Patient taking differently: Take 3 mLs by nebulization every 6 (six) hours as needed (SOB, wheezing). Dx 493.90), Disp: 360 mL, Rfl: 5 .  Melatonin 10 MG TABS, Take 10 mg by mouth at bedtime. , Disp: , Rfl:  .  paliperidone (INVEGA) 3 MG 24 hr tablet, Take 1 tablet (3 mg total) by mouth at bedtime., Disp: 90 tablet, Rfl: 1 .  tobramycin-dexamethasone (TOBRADEX) ophthalmic solution, Apply 1 drop in affected eye(s) every 2 hours for two days. Then every 4 hours for 5 days., Disp: 5 mL, Rfl: 0 .  traZODone (DESYREL) 100 MG tablet, TAKE 3 TABLETS AT BEDTIME, Disp: 90 tablet, Rfl: 0 .  traZODone (DESYREL) 100 MG tablet, Take 3 tablets (300 mg total) by mouth at bedtime., Disp: 90 tablet, Rfl: 0 .  Vitamin D, Ergocalciferol, (DRISDOL) 50000 units CAPS capsule, TAKE 1 CAPSULE ONCE A WEEK, Disp: 12 capsule, Rfl: 0 Social History   Socioeconomic History  . Marital status: Single    Spouse name: Not on file  . Number of children: Not on file  . Years of education: Not on file  . Highest education level: Not on file  Occupational History  . Not on file  Social Needs  . Financial resource strain:  Not on file  . Food insecurity:    Worry: Not on file    Inability: Not on file  . Transportation needs:    Medical: Not on file    Non-medical: Not on file  Tobacco Use  . Smoking status: Never Smoker  . Smokeless tobacco: Never Used  Substance and Sexual Activity  . Alcohol use: No  . Drug use: No  . Sexual activity: Never  Lifestyle  . Physical activity:    Days per week: Not on file    Minutes per session: Not on file  . Stress: Not on file  Relationships  . Social connections:    Talks on phone: Not on file    Gets together: Not on file    Attends religious service: Not on file    Active  member of club or organization: Not on file    Attends meetings of clubs or organizations: Not on file    Relationship status: Not on file  . Intimate partner violence:    Fear of current or ex partner: Not on file    Emotionally abused: Not on file    Physically abused: Not on file    Forced sexual activity: Not on file  Other Topics Concern  . Not on file  Social History Narrative   Sergio [name] is a 32 year old male.   He lives with both of his parents. He has 1 sister,who is 32.    He enjoys making his bed and watching movies. He has 700 movies.    Family History  Problem Relation Age of Onset  . Hypertension Mother   . Renal Disease Father   . Emphysema Maternal Grandfather        Died at 22    Objective: Office vital signs reviewed. BP 110/62   Pulse 64   Temp 98.1 F (36.7 C) (Oral)   Ht 5' (1.524 m)   Wt 262 lb (118.8 kg)   BMI 51.17 kg/m   Physical Examination:  General: Awake, alert, obese, syndromic facies, No acute distress Skin: Macerated, blanching, erythematous maculopapular rash with satellite lesions appreciated along the right axilla and right inguinal fold.  There is no exudate, fluctuance or purulence.  Assessment/ Plan: 32 y.o. male   1. Intertriginous candidiasis Physical exam consistent with candidal intertriginous infection.  I have prescribed ketoconazole to apply to affected areas once daily for the next 2 to 3 weeks.  He will follow-up if symptoms are not improving or worsen.  Home care instructions were reviewed with the patient's father who voiced good understanding.  Keep area dry and clean.  Meds ordered this encounter  Medications  . ketoconazole (NIZORAL) 2 % cream    Sig: Apply to affected areas under right armpit and along the right groin area once a day for 2 to 3 weeks.    Dispense:  60 g    Refill:  Sergio [name], Sergio [name] [phone]

## 2017-11-11 NOTE — Patient Instructions (Signed)
Intertrigo Intertrigo is skin irritation or inflammation (dermatitis) that occurs when folds of skin rub together. The irritation can cause a rash and make skin raw and itchy. This condition most commonly occurs in the skin folds of these areas:  Toes.  Armpits.  Groin.  Belly.  Breasts.  Buttocks.  Intertrigo is not passed from person to person (is not contagious). What are the causes? This condition is caused by heat, moisture, friction, and lack of air circulation. The condition can be made worse by:  Sweat.  Bacteria or a fungus, such as yeast.  What increases the risk? This condition is more likely to occur if you have moisture in your skin folds. It is also more likely to develop in people who:  Have diabetes.  Are overweight.  Are on bed rest.  Live in a warm and moist climate.  Wear splints, braces, or other medical devices.  Are not able to control their bowels or bladder (have incontinence).  What are the signs or symptoms? Symptoms of this condition include:  A pink or red skin rash.  Brown patches on the skin.  Raw or scaly skin.  Itchiness.  A burning feeling.  Bleeding.  Leaking fluid.  A bad smell.  How is this diagnosed? This condition is diagnosed with a medical history and physical exam. You may also have a skin swab to test for bacteria or a fungus, such as yeast. How is this treated? Treatment may include:  Cleaning and drying your skin.  An oral antibiotic medicine or antibiotic skin cream for a bacterial infection.  Antifungal cream or pills for an infection that was caused by a fungus, such as yeast.  Steroid ointment to relieve itchiness and irritation.  Follow these instructions at home:  Keep the affected area clean and dry.  Do not scratch your skin.  Stay in a cool environment as much as possible. Use an air conditioner or fan, if available.  Apply over-the-counter and prescription medicines only as told by your  health care provider.  If you were prescribed an antibiotic medicine, use it as told by your health care provider. Do not stop using the antibiotic even if your condition improves.  Keep all follow-up visits as told by your health care provider. This is important. How is this prevented?  Maintain a healthy weight.  Take care of your feet, especially if you have diabetes. Foot care includes: ? Wearing shoes that fit well. ? Keeping your feet dry. ? Wearing clean, breathable socks.  Protect the skin around your groin and buttocks, especially if you have incontinence. Skin protection includes: ? Following a regular cleaning routine. ? Using moisturizers and skin protectants. ? Changing protection pads frequently.  Do not wear tight clothes. Wear clothes that are loose and absorbent. Wear clothes that are made of cotton.  Wear a bra that gives good support, if needed.  Shower and dry yourself thoroughly after activity. Use a hair dryer on a cool setting to dry between skin folds, especially after you bathe.  If you have diabetes, keep your blood sugar under control. Contact a health care provider if:  Your symptoms do not improve with treatment.  Your symptoms get worse or they spread.  You notice increased redness and warmth.  You have a fever. This information is not intended to replace advice given to you by your health care provider. Make sure you discuss any questions you have with your health care provider. Document Released: 04/22/2005 Document Revised: 09/28/2015   Document Reviewed: 10/24/2014 Elsevier Interactive Patient Education  2018 [employer]  

## 2017-11-17 ENCOUNTER — Other Ambulatory Visit (INDEPENDENT_AMBULATORY_CARE_PROVIDER_SITE_OTHER): Payer: Self-pay | Admitting: Pediatrics

## 2017-11-18 ENCOUNTER — Other Ambulatory Visit (HOSPITAL_COMMUNITY): Payer: Self-pay | Admitting: Psychiatry

## 2017-11-18 DIAGNOSIS — F209 Schizophrenia, unspecified: Secondary | ICD-10-CM

## 2017-12-16 ENCOUNTER — Ambulatory Visit (INDEPENDENT_AMBULATORY_CARE_PROVIDER_SITE_OTHER): Payer: Medicare Other | Admitting: Family Medicine

## 2017-12-16 ENCOUNTER — Encounter: Payer: Self-pay | Admitting: Family Medicine

## 2017-12-16 VITALS — BP 109/67 | HR 79 | Temp 98.4°F | Ht 60.0 in | Wt 264.0 lb

## 2017-12-16 DIAGNOSIS — B354 Tinea corporis: Secondary | ICD-10-CM | POA: Diagnosis not present

## 2017-12-16 MED ORDER — FLUCONAZOLE 100 MG PO TABS: 100.0000 mg | ORAL_TABLET | Freq: Every day | ORAL | 0 refills | Status: DC

## 2017-12-16 NOTE — Progress Notes (Signed)
Chief Complaint  Patient presents with  . Rash    pt here today c/o rash that hasn't gotten any better under armpits and "crotch"    HPI  Patient presents today for rash that hsd not resonded to nystatin. Used daily for a month. Seems to be spreading. Very irritated. Mom thinks he is getting it on the face and under the left eye  PMH: Smoking status noted ROS: Per HPI  Objective: BP 109/67   Pulse 79   Temp 98.4 F (36.9 C) (Oral)   Ht 5' (1.524 m)   Wt 264 lb (119.7 kg)   BMI 51.56 kg/m  Gen: NAD, alert, cooperative with exam HEENT: NCAT, EOMI, PERRL CV: RRR, good S1/S2, no murmur Resp: CTABL, no wheezes, non-labored Skin: there is gualded erythema under the right axilla with satellites. Slight hyperemia of cheeks Neuro: Alert and oriented, No gross deficits  Assessment and plan:  1. Tinea corporis     Meds ordered this encounter  Medications  . fluconazole (DIFLUCAN) 100 MG tablet    Sig: Take 1 tablet (100 mg total) by mouth daily. Except take two , together, on the first day    Dispense:  15 tablet    Refill:  0      Follow up as needed.  Claretta Fraise, MD

## 2017-12-25 ENCOUNTER — Other Ambulatory Visit (HOSPITAL_COMMUNITY): Payer: Self-pay

## 2017-12-25 DIAGNOSIS — F209 Schizophrenia, unspecified: Secondary | ICD-10-CM

## 2017-12-25 MED ORDER — HYDROXYZINE PAMOATE 50 MG PO CAPS: ORAL_CAPSULE | ORAL | 0 refills | Status: DC

## 2017-12-25 MED ORDER — PALIPERIDONE ER 3 MG PO TB24: 3.0000 mg | ORAL_TABLET | Freq: Every day | ORAL | 0 refills | Status: DC

## 2017-12-25 MED ORDER — TRAZODONE HCL 100 MG PO TABS: 300.0000 mg | ORAL_TABLET | Freq: Every day | ORAL | 0 refills | Status: DC

## 2017-12-30 ENCOUNTER — Encounter: Payer: Self-pay | Admitting: Family Medicine

## 2017-12-30 ENCOUNTER — Ambulatory Visit (INDEPENDENT_AMBULATORY_CARE_PROVIDER_SITE_OTHER): Payer: Medicare Other | Admitting: Family Medicine

## 2017-12-30 VITALS — BP 111/66 | HR 65 | Temp 98.5°F | Ht 60.0 in | Wt 262.0 lb

## 2017-12-30 DIAGNOSIS — R32 Unspecified urinary incontinence: Secondary | ICD-10-CM | POA: Insufficient documentation

## 2017-12-30 DIAGNOSIS — L219 Seborrheic dermatitis, unspecified: Secondary | ICD-10-CM | POA: Diagnosis not present

## 2017-12-30 MED ORDER — KETOCONAZOLE 2 % EX SHAM: MEDICATED_SHAMPOO | CUTANEOUS | 0 refills | Status: DC

## 2017-12-30 NOTE — Progress Notes (Signed)
Subjective: CC: facial rash PCP: Sergio Norlander, DO Sergio [name] is a 32 y.o. male presenting to clinic today for:  1. Facial rash Patient with a several week history of rash on the forehead and bilateral cheeks.  He has a similar rash under the right armpit.  He has been using a topical cream and then was placed on an oral antifungal.  He is almost totally done with a 2-week treatment with Diflucan with little to no improvement in his rash.  His father is concerned because it is not resolving.  Rash is flaky on the face.   ROS: Per HPI  Allergies  Allergen Reactions  . Cefaclor Anaphylaxis and Shortness Of Breath  . Penicillins Anaphylaxis and Rash    Has patient had a PCN reaction causing immediate rash, facial/tongue/throat swelling, SOB or lightheadedness with hypotension: yes Has patient had a PCN reaction causing severe rash involving mucus membranes or skin necrosis: no Has patient had a PCN reaction that required hospitalization: no Has patient had a PCN reaction occurring within the last 10 years: no If all of the above answers are "NO", then may proceed with Cephalosporin use.   . Clindamycin Rash  . Clindamycin/Lincomycin Rash   Past Medical History:  Diagnosis Date  . Dementia   . Difficulty swallowing   . Down's syndrome   . Dysphagia causing pulmonary aspiration with swallowing   . GERD (gastroesophageal reflux disease)   . Headache(784.0)   . Hydrocephalus   . Insomnia   . Memory loss   . OSA (obstructive sleep apnea)   . Pituitary adenoma ([place])   . Schizoaffective disorder   . Weakness     Current Outpatient Medications:  .  donepezil (ARICEPT) 10 MG tablet, TAKE ONE TABLET AT BEDTIME, Disp: 30 tablet, Rfl: 0 .  fluconazole (DIFLUCAN) 100 MG tablet, Take 1 tablet (100 mg total) by mouth daily. Except take two , together, on the first day, Disp: 15 tablet, Rfl: 0 .  hydrOXYzine (VISTARIL) 50 MG capsule, Take one capsule three times a day,  Disp: 90 capsule, Rfl: 0 .  ipratropium-albuterol (DUONEB) 0.5-2.5 (3) MG/3ML SOLN, Take 3 mLs by nebulization every 6 (six) hours as needed. Dx 493.90 (Patient taking differently: Take 3 mLs by nebulization every 6 (six) hours as needed (SOB, wheezing). Dx 493.90), Disp: 360 mL, Rfl: 5 .  ketoconazole (NIZORAL) 2 % cream, Apply to affected areas under right armpit and along the right groin area once a day for 2 to 3 weeks., Disp: 60 g, Rfl: 0 .  Melatonin 10 MG TABS, Take 10 mg by mouth at bedtime. , Disp: , Rfl:  .  paliperidone (INVEGA) 3 MG 24 hr tablet, Take 1 tablet (3 mg total) by mouth at bedtime., Disp: 90 tablet, Rfl: 0 .  traZODone (DESYREL) 100 MG tablet, Take 3 tablets (300 mg total) by mouth at bedtime., Disp: 90 tablet, Rfl: 0 .  Vitamin D, Ergocalciferol, (DRISDOL) 50000 units CAPS capsule, TAKE 1 CAPSULE ONCE A WEEK, Disp: 12 capsule, Rfl: 0 Social History   Socioeconomic History  . Marital status: Single    Spouse name: Not on file  . Number of children: Not on file  . Years of education: Not on file  . Highest education level: Not on file  Occupational History  . Not on file  Social Needs  . Financial resource strain: Not on file  . Food insecurity:    Worry: Not on file  Inability: Not on file  . Transportation needs:    Medical: Not on file    Non-medical: Not on file  Tobacco Use  . Smoking status: Never Smoker  . Smokeless tobacco: Never Used  Substance and Sexual Activity  . Alcohol use: No  . Drug use: No  . Sexual activity: Never  Lifestyle  . Physical activity:    Days per week: Not on file    Minutes per session: Not on file  . Stress: Not on file  Relationships  . Social connections:    Talks on phone: Not on file    Gets together: Not on file    Attends religious service: Not on file    Active member of club or organization: Not on file    Attends meetings of clubs or organizations: Not on file    Relationship status: Not on file  .  Intimate partner violence:    Fear of current or ex partner: Not on file    Emotionally abused: Not on file    Physically abused: Not on file    Forced sexual activity: Not on file  Other Topics Concern  . Not on file  Social History Narrative   Sergio [name] is a 32 year old male.   He lives with both of his parents. He has 1 sister,who is 32.    He enjoys making his bed and watching movies. He has 700 movies.    Family History  Problem Relation Age of Onset  . Hypertension Mother   . Renal Disease Father   . Emphysema Maternal Grandfather        Died at 33    Objective: Office vital signs reviewed. BP 111/66   Pulse 65   Temp 98.5 F (36.9 C) (Oral)   Ht 5' (1.524 m)   Wt 262 lb (118.8 kg)   BMI 51.17 kg/m   Physical Examination:  General: Awake, alert, obese, syndromic, No acute distress Skin: Mildly erythematous scaly rash appreciated between the brow.  He has a macular papular rash noted along bilateral cheeks and under the right axilla.  He has small pustule formations noted along the trunk.  Assessment/ Plan: 32 y.o. male   1. Seborrheic dermatitis The rash on the face appears consistent with seborrheic dermatitis.  The area under the axilla is more consistent with an intertrigo.  Continue the oral antifungal as directed.  Keep areas clean and dry.  I have prescribed him ketoconazole to wash the face and trunk with twice a week for the next 2 to 4 weeks.  Referral to dermatology also placed given refractory nature of rash.  Reasons for return discussed.  Follow-up PRN. - Ambulatory referral to Dermatology   Orders Placed This Encounter  Procedures  . Ambulatory referral to Dermatology    Referral Priority:   Routine    Referral Type:   Consultation    Referral Reason:   Specialty Services Required    Requested Specialty:   Dermatology    Number of Visits Requested:   1   Meds ordered this encounter  Medications  . ketoconazole (NIZORAL) 2 % shampoo    Sig: Apply  to affected areas, lather, leave on 3 to 5 minutes then rinse.  Do this twice weekly for the next 2 to 4 weeks.    Dispense:  120 mL    Refill:  [place], DO [place] [phone]

## 2018-01-22 ENCOUNTER — Other Ambulatory Visit (HOSPITAL_COMMUNITY): Payer: Self-pay | Admitting: Psychiatry

## 2018-01-22 ENCOUNTER — Other Ambulatory Visit: Payer: Self-pay | Admitting: Family Medicine

## 2018-01-22 DIAGNOSIS — L0100 Impetigo, unspecified: Secondary | ICD-10-CM | POA: Diagnosis not present

## 2018-01-22 DIAGNOSIS — L219 Seborrheic dermatitis, unspecified: Secondary | ICD-10-CM | POA: Diagnosis not present

## 2018-01-22 DIAGNOSIS — F209 Schizophrenia, unspecified: Secondary | ICD-10-CM

## 2018-01-22 DIAGNOSIS — L719 Rosacea, unspecified: Secondary | ICD-10-CM | POA: Diagnosis not present

## 2018-01-26 ENCOUNTER — Other Ambulatory Visit (HOSPITAL_COMMUNITY): Payer: Self-pay

## 2018-01-26 DIAGNOSIS — F209 Schizophrenia, unspecified: Secondary | ICD-10-CM

## 2018-01-26 MED ORDER — TRAZODONE HCL 100 MG PO TABS: 300.0000 mg | ORAL_TABLET | Freq: Every day | ORAL | 0 refills | Status: DC

## 2018-01-26 MED ORDER — HYDROXYZINE PAMOATE 50 MG PO CAPS: ORAL_CAPSULE | ORAL | 0 refills | Status: DC

## 2018-01-30 ENCOUNTER — Other Ambulatory Visit: Payer: Self-pay | Admitting: Family Medicine

## 2018-01-30 MED ORDER — METRONIDAZOLE 0.75 % EX CREA: TOPICAL_CREAM | Freq: Two times a day (BID) | CUTANEOUS | 0 refills | Status: DC

## 2018-01-30 MED ORDER — DOXYCYCLINE HYCLATE 100 MG PO TABS: 100.0000 mg | ORAL_TABLET | Freq: Two times a day (BID) | ORAL | 0 refills | Status: DC

## 2018-01-30 NOTE — Progress Notes (Signed)
Saw Dr Tarri Glenn.  Placed on Doxycyline and Metrocream for rosacea.

## 2018-03-12 DIAGNOSIS — L0100 Impetigo, unspecified: Secondary | ICD-10-CM | POA: Diagnosis not present

## 2018-03-12 DIAGNOSIS — B354 Tinea corporis: Secondary | ICD-10-CM | POA: Diagnosis not present

## 2018-03-12 DIAGNOSIS — L219 Seborrheic dermatitis, unspecified: Secondary | ICD-10-CM | POA: Diagnosis not present

## 2018-03-23 ENCOUNTER — Other Ambulatory Visit (HOSPITAL_COMMUNITY): Payer: Self-pay | Admitting: Psychiatry

## 2018-03-23 ENCOUNTER — Other Ambulatory Visit: Payer: Self-pay | Admitting: Family Medicine

## 2018-03-23 DIAGNOSIS — F209 Schizophrenia, unspecified: Secondary | ICD-10-CM

## 2018-03-31 ENCOUNTER — Ambulatory Visit: Payer: Medicare Other

## 2018-04-25 ENCOUNTER — Other Ambulatory Visit (HOSPITAL_COMMUNITY): Payer: Self-pay | Admitting: Psychiatry

## 2018-04-25 DIAGNOSIS — F209 Schizophrenia, unspecified: Secondary | ICD-10-CM

## 2018-05-19 ENCOUNTER — Other Ambulatory Visit (HOSPITAL_COMMUNITY): Payer: Self-pay | Admitting: Psychiatry

## 2018-05-19 DIAGNOSIS — F209 Schizophrenia, unspecified: Secondary | ICD-10-CM

## 2018-05-20 ENCOUNTER — Other Ambulatory Visit (HOSPITAL_COMMUNITY): Payer: Self-pay

## 2018-05-20 DIAGNOSIS — F209 Schizophrenia, unspecified: Secondary | ICD-10-CM

## 2018-05-20 MED ORDER — HYDROXYZINE PAMOATE 50 MG PO CAPS: ORAL_CAPSULE | ORAL | 0 refills | Status: DC

## 2018-05-20 MED ORDER — TRAZODONE HCL 100 MG PO TABS: 300.0000 mg | ORAL_TABLET | Freq: Every day | ORAL | 0 refills | Status: DC

## 2018-05-20 NOTE — Progress Notes (Unsigned)
Pharmacy request for refills on Trazodone and Vistaril, I sent in 1 month of each and called patients mother to inform her that she needs to call in and make an apointment

## 2018-05-22 NOTE — Telephone Encounter (Signed)
Reordered hydroxyzine for patient

## 2018-06-18 ENCOUNTER — Ambulatory Visit (INDEPENDENT_AMBULATORY_CARE_PROVIDER_SITE_OTHER): Payer: Medicare Other | Admitting: Psychiatry

## 2018-06-18 ENCOUNTER — Encounter (HOSPITAL_COMMUNITY): Payer: Self-pay | Admitting: Psychiatry

## 2018-06-18 VITALS — BP 129/70 | HR 78 | Ht <= 58 in | Wt 279.0 lb

## 2018-06-18 DIAGNOSIS — F5101 Primary insomnia: Secondary | ICD-10-CM

## 2018-06-18 DIAGNOSIS — F209 Schizophrenia, unspecified: Secondary | ICD-10-CM

## 2018-06-18 MED ORDER — TRAZODONE HCL 100 MG PO TABS: 300.0000 mg | ORAL_TABLET | Freq: Every day | ORAL | 3 refills | Status: DC

## 2018-06-18 MED ORDER — HYDROXYZINE PAMOATE 50 MG PO CAPS: 50.0000 mg | ORAL_CAPSULE | Freq: Three times a day (TID) | ORAL | 6 refills | Status: DC | PRN

## 2018-06-18 MED ORDER — PALIPERIDONE ER 3 MG PO TB24: 3.0000 mg | ORAL_TABLET | Freq: Every day | ORAL | 3 refills | Status: DC

## 2018-06-18 NOTE — Progress Notes (Signed)
[facility] MD/PA/NP OP Progress Note  06/18/2018 1:55 PM Sergio [name]  MRN:  [id number]  Chief Complaint:  Chief Complaint    psychosis; Dementia; Follow-up     HPI: Patient is a 33 year old male diagnosed with schizophrenia, Down syndrome, GERD, hydrocephalus and dementia who presents today for a follow-up visit.  Patient reports that the "Weirdos" are still there, as that he always wants the bottle with them, reports that they do not bother him.  Mom agrees with this and reports the patient seems to be much more interactive, is doing well, reports that he stays in his room and occasionally disturbs them at night.  She adds that the voices he hears are his friends, reports that he is able to cope with it, does not feel he needs any medication change.  She also states that he stopped the Aricept and seems much more interactive since then.  Mom states that they are using daily reward system to help with patient staying in his room at night.  Mom states that they offer him snacks if he stays in his room, does not disturb them, adds that this is working and helping patient.  She reports that he seems to be doing well in regards to his physical health, as that he has not had pneumonia for 3 years now as he takes his medications with yogurt.  Mom and patient deny any symptoms of depression, any worsening of the voices, any side effects to the medications.  They also deny any thoughts of self-harm or harm to others.  Mom reports that patient still attending the adult care center 3 days a week and seems to enjoy it  Discussed with mom the need for patient to have his lipid panel and hemoglobin A1c done.  Mom states that she will set up an appointment with his PCP and get this done.   Visit Diagnosis:    ICD-10-CM   1. Primary insomnia F51.01 traZODone (DESYREL) 100 MG tablet  2. Schizophrenia, unspecified type ([place]) F20.9 paliperidone (INVEGA) 3 MG 24 hr tablet    hydrOXYzine (VISTARIL) 50 MG capsule     Past Psychiatric History: Unchanged from previous visit  Past Medical History:  Past Medical History:  Diagnosis Date  . Dementia ([place])   . Difficulty swallowing   . Down's syndrome   . Dysphagia causing pulmonary aspiration with swallowing   . GERD (gastroesophageal reflux disease)   . Headache(784.0)   . Hydrocephalus ([place])   . Insomnia   . Memory loss   . OSA (obstructive sleep apnea)   . Pituitary adenoma ([place])   . Schizoaffective disorder   . Weakness     Past Surgical History:  Procedure Laterality Date  . BRAIN SURGERY    . CSF SHUNT    . TYMPANOSTOMY TUBE PLACEMENT Bilateral   . VENTRICULOPERITONEAL SHUNT      Family Psychiatric History: unchanged from previous visit  Family History:  Family History  Problem Relation Age of Onset  . Hypertension Mother   . Renal Disease Father   . Emphysema Maternal Grandfather        Died at 59    Social History:  Social History   Socioeconomic History  . Marital status: Single    Spouse name: Not on file  . Number of children: Not on file  . Years of education: Not on file  . Highest education level: Not on file  Occupational History  . Not on file  Social Needs  .  Financial resource strain: Not on file  . Food insecurity:    Worry: Not on file    Inability: Not on file  . Transportation needs:    Medical: Not on file    Non-medical: Not on file  Tobacco Use  . Smoking status: Never Smoker  . Smokeless tobacco: Never Used  Substance and Sexual Activity  . Alcohol use: No  . Drug use: No  . Sexual activity: Never  Lifestyle  . Physical activity:    Days per week: Not on file    Minutes per session: Not on file  . Stress: Not on file  Relationships  . Social connections:    Talks on phone: Not on file    Gets together: Not on file    Attends religious service: Not on file    Active member of club or organization: Not on file    Attends meetings of clubs or organizations: Not on file     Relationship status: Not on file  Other Topics Concern  . Not on file  Social History Narrative   Harley is a 33 year old male.   He lives with both of his parents. He has 1 sister,who is 32.    He enjoys making his bed and watching movies. He has 700 movies.     Allergies:  Allergies  Allergen Reactions  . Cefaclor Anaphylaxis and Shortness Of Breath  . Penicillins Anaphylaxis and Rash    Has patient had a PCN reaction causing immediate rash, facial/tongue/throat swelling, SOB or lightheadedness with hypotension: yes Has patient had a PCN reaction causing severe rash involving mucus membranes or skin necrosis: no Has patient had a PCN reaction that required hospitalization: no Has patient had a PCN reaction occurring within the last 10 years: no If all of the above answers are "NO", then may proceed with Cephalosporin use.   . Clindamycin Rash  . Clindamycin/Lincomycin Rash    Metabolic Disorder Labs: No results found for: HGBA1C, MPG Lab Results  Component Value Date   PROLACTIN 51.5 (H) 01/21/2013   No results found for: CHOL, TRIG, HDL, CHOLHDL, VLDL, LDLCALC Lab Results  Component Value Date   TSH 3.020 01/21/2013    Therapeutic Level Labs: No results found for: LITHIUM No results found for: VALPROATE No components found for:  CBMZ  Current Medications: Current Outpatient Medications  Medication Sig Dispense Refill  . doxycycline (VIBRA-TABS) 100 MG tablet Take 1 tablet (100 mg total) by mouth 2 (two) times daily. 20 tablet 0  . fluconazole (DIFLUCAN) 100 MG tablet Take 1 tablet (100 mg total) by mouth daily. Except take two , together, on the first day 15 tablet 0  . hydrOXYzine (VISTARIL) 50 MG capsule TAKE (1) CAPSULE THREE TIMES DAILY. 90 capsule 0  . hydrOXYzine (VISTARIL) 50 MG capsule Take one capsule po tid 90 capsule 0  . ipratropium-albuterol (DUONEB) 0.5-2.5 (3) MG/3ML SOLN Take 3 mLs by nebulization every 6 (six) hours as needed. Dx 493.90 (Patient  taking differently: Take 3 mLs by nebulization every 6 (six) hours as needed (SOB, wheezing). Dx 493.90) 360 mL 5  . ketoconazole (NIZORAL) 2 % shampoo Apply to affected areas, lather, leave on 3 to 5 minutes then rinse.Do this twice weekly for the next 2 to 4 weeks. 120 mL 0  . Melatonin 10 MG TABS Take 10 mg by mouth at bedtime.     . traZODone (DESYREL) 100 MG tablet Take 3 tablets (300 mg total) by mouth at bedtime.  90 tablet 0  . traZODone (DESYREL) 100 MG tablet TAKE 3 TABLETS AT BEDTIME 90 tablet 0  . traZODone (DESYREL) 100 MG tablet Take 3 tablets (300 mg total) by mouth at bedtime. 90 tablet 0  . Vitamin D, Ergocalciferol, (DRISDOL) 50000 units CAPS capsule TAKE 1 CAPSULE ONCE A WEEK 12 capsule 0  . donepezil (ARICEPT) 10 MG tablet TAKE ONE TABLET AT BEDTIME (Patient not taking: Reported on 06/18/2018) 30 tablet 0  . metroNIDAZOLE (METROCREAM) 0.75 % cream Apply topically 2 (two) times daily. 45 g 0  . paliperidone (INVEGA) 3 MG 24 hr tablet TAKE ONE TABLET AT BEDTIME 90 tablet 0   No current facility-administered medications for this visit.      Musculoskeletal: Strength & Muscle Tone: within normal limits Gait & Station: normal Patient leans: N/A  Psychiatric Specialty Exam: Review of Systems  Constitutional: Negative.  Negative for fever and malaise/fatigue.  HENT: Negative.  Negative for congestion, hearing loss and sore throat.   Eyes: Negative.  Negative for blurred vision, double vision, photophobia, pain, discharge and redness.  Cardiovascular: Negative.  Negative for chest pain and palpitations.  Gastrointestinal: Negative.  Negative for abdominal pain, heartburn, nausea and vomiting.  Skin: Negative.  Negative for rash.  Neurological: Negative.  Negative for dizziness, seizures, loss of consciousness and headaches.  Psychiatric/Behavioral: Negative.  Negative for depression, hallucinations, substance abuse and suicidal ideas. The patient is not nervous/anxious and does  not have insomnia.     Blood pressure 129/70, pulse 78, height 1' (0.305 m), weight 279 lb (126.6 kg).Body mass index is 1,362.21 kg/m.  General Appearance: Casual  Eye Contact:  Good  Speech:  Slow  Volume:  Normal  Mood:  Euthymic  Affect:  Appropriate and Congruent  Thought Process:  Coherent and Descriptions of Associations: Intact  Orientation:  Other:  Place and person  Thought Content: Delusions and Hallucinations: Auditory   Suicidal Thoughts:  No  Homicidal Thoughts:  No  Memory:  Immediate;   Fair Recent;   Fair Remote;   Poor  Judgement:  Impaired  Insight:  Shallow  Psychomotor Activity:  Decreased and Wide Base  Concentration:  Concentration: Fair and Attention Span: Fair  Recall:  [employer] of Knowledge: Poor  Language: Fair  Akathisia:  No  Handed:  Right  AIMS (if indicated): not done  Assets:  Wellsite geologist  ADL's:  Intact  Cognition: Impaired,  Moderate  Sleep:  Fair   Screenings: PHQ2-9     Office Visit from 12/30/2017 in [facility] Visit from 11/11/2017 in [facility] Visit from 06/30/2017 in [facility] Visit from 02/26/2017 in [facility] Visit from 06/14/2016 in [facility]  PHQ-2 Total Score  0  0  0  0  0       Assessment and Plan:  Schizophrenia To continue Invega 3 mg 1 at night for psychosis.  Discussed again about the need for patient to have his hemoglobin A1c, lipid panel done at his next PCP visit.  Mom states that she will make this appointment. To continue Vistaril 50 mg 3 times a day as needed for agitation Insomnia To continue trazodone 300 mg at bedtime for sleep To continue melatonin 10 mg at sunset to help patient sleep with his sleep-wake cycle Discussed again sleep hygiene, use and reward system to help patient sleep at night.  Mom reports that,  there  has been improvement with this Obstructive sleep apnea Continue to use CPAP at night Dementia patient no longer on Aricept Aspiration pneumonia Patient using yogurt to take his medication, seems to be doing well and has not had pneumonia in the past 3 years  Call as needed Follow-up in 6 to 12 months 50% of this visit was spent in assessing patient in regards to his dementia, his medical complaints, the progress patient's made, discussing daily report system to continue to help with patient's behavior, discussed sleep hygiene, and medications at length at this visit.  This visit was a 30-minute appointment and was of moderate complexity.  Also discussed diet and exercise in length with mom as patient is obese  Hampton Abbot, MD 06/18/2018, 1:55 PM

## 2018-06-22 ENCOUNTER — Ambulatory Visit (HOSPITAL_COMMUNITY): Payer: Medicare Other | Admitting: Psychiatry

## 2018-06-22 ENCOUNTER — Other Ambulatory Visit: Payer: Self-pay | Admitting: Family Medicine

## 2018-06-22 ENCOUNTER — Telehealth: Payer: Self-pay | Admitting: Family Medicine

## 2018-06-22 DIAGNOSIS — J9801 Acute bronchospasm: Secondary | ICD-10-CM

## 2018-06-22 DIAGNOSIS — Q909 Down syndrome, unspecified: Secondary | ICD-10-CM

## 2018-06-22 NOTE — Telephone Encounter (Signed)
Mother aware rx has been faxed to [facility]

## 2018-06-22 NOTE — Telephone Encounter (Signed)
Faxed to [facility]  

## 2018-06-22 NOTE — Telephone Encounter (Signed)
Cc gina

## 2018-07-02 DIAGNOSIS — L304 Erythema intertrigo: Secondary | ICD-10-CM | POA: Diagnosis not present

## 2018-07-02 DIAGNOSIS — L719 Rosacea, unspecified: Secondary | ICD-10-CM | POA: Diagnosis not present

## 2018-07-02 DIAGNOSIS — L219 Seborrheic dermatitis, unspecified: Secondary | ICD-10-CM | POA: Diagnosis not present

## 2018-07-04 DIAGNOSIS — Z23 Encounter for immunization: Secondary | ICD-10-CM | POA: Diagnosis not present

## 2018-07-22 ENCOUNTER — Other Ambulatory Visit: Payer: Self-pay | Admitting: Family

## 2018-10-05 ENCOUNTER — Telehealth: Payer: Self-pay | Admitting: Family Medicine

## 2018-11-03 NOTE — Telephone Encounter (Signed)
Asked to talk to [facility]

## 2018-11-03 NOTE — Telephone Encounter (Signed)
Apt scheduled.  

## 2018-11-09 ENCOUNTER — Other Ambulatory Visit: Payer: Self-pay

## 2018-11-09 ENCOUNTER — Ambulatory Visit (INDEPENDENT_AMBULATORY_CARE_PROVIDER_SITE_OTHER): Payer: Medicare Other | Admitting: Family Medicine

## 2018-11-09 VITALS — BP 109/69 | HR 61 | Temp 99.3°F | Ht 62.0 in | Wt 278.0 lb

## 2018-11-09 DIAGNOSIS — L719 Rosacea, unspecified: Secondary | ICD-10-CM

## 2018-11-09 DIAGNOSIS — Z205 Contact with and (suspected) exposure to viral hepatitis: Secondary | ICD-10-CM

## 2018-11-09 MED ORDER — DOXYCYCLINE HYCLATE 100 MG PO TABS: 100.0000 mg | ORAL_TABLET | Freq: Every day | ORAL | 1 refills | Status: DC

## 2018-11-09 MED ORDER — METRONIDAZOLE 0.75 % EX CREA: TOPICAL_CREAM | Freq: Two times a day (BID) | CUTANEOUS | 3 refills | Status: DC

## 2018-11-09 NOTE — Progress Notes (Signed)
Subjective: CC: Possible exposure to hepatitis B PCP: Janora Norlander, DO Sergio [name] is a 34 y.o. male presenting to clinic today for:  1.  Possible exposure to hepatitis B Patient is brought today by his father, who notes that the patient's mother was recently diagnosed with hepatitis B.  Unsure where she contracted this virus from but she is adamant about having the patient tested.  No change in skin color.  No unplanned weight loss.  No night sweats.  No abdominal pain.  2.  Rosacea Patient was seen by dermatology last year for rosacea.  He has been treated with doxycycline 100 mg daily as well as metronidazole applied to the affected areas up to 2 times daily.  His father is asking for refills on this medication as it is helping control symptoms.  ROS: Per HPI  Allergies  Allergen Reactions  . Cefaclor Anaphylaxis and Shortness Of Breath  . Penicillins Anaphylaxis and Rash    Has patient had a PCN reaction causing immediate rash, facial/tongue/throat swelling, SOB or lightheadedness with hypotension: yes Has patient had a PCN reaction causing severe rash involving mucus membranes or skin necrosis: no Has patient had a PCN reaction that required hospitalization: no Has patient had a PCN reaction occurring within the last 10 years: no If all of the above answers are "NO", then may proceed with Cephalosporin use.   . Clindamycin Rash  . Clindamycin/Lincomycin Rash   Past Medical History:  Diagnosis Date  . Dementia ([place])   . Difficulty swallowing   . Down's syndrome   . Dysphagia causing pulmonary aspiration with swallowing   . GERD (gastroesophageal reflux disease)   . Headache(784.0)   . Hydrocephalus ([place])   . Insomnia   . Memory loss   . OSA (obstructive sleep apnea)   . Pituitary adenoma ([place])   . Schizoaffective disorder   . Weakness     Current Outpatient Medications:  .  doxycycline (VIBRA-TABS) 100 MG tablet, Take 1 tablet (100 mg total) by  mouth daily., Disp: 90 tablet, Rfl: 1 .  hydrOXYzine (VISTARIL) 50 MG capsule, Take 1 capsule (50 mg total) by mouth 3 (three) times daily as needed., Disp: 90 capsule, Rfl: 6 .  ipratropium-albuterol (DUONEB) 0.5-2.5 (3) MG/3ML SOLN, Take 3 mLs by nebulization every 6 (six) hours as needed (SOB, wheezing). Dx 493.90, Disp: 180 mL, Rfl: 2 .  ketoconazole (NIZORAL) 2 % shampoo, Apply to affected areas, lather, leave on 3 to 5 minutes then rinse.Do this twice weekly for the next 2 to 4 weeks., Disp: 120 mL, Rfl: 0 .  Melatonin 10 MG TABS, Take 10 mg by mouth at bedtime. , Disp: , Rfl:  .  metroNIDAZOLE (METROCREAM) 0.75 % cream, Apply topically 2 (two) times daily., Disp: 45 g, Rfl: 3 .  paliperidone (INVEGA) 3 MG 24 hr tablet, Take 1 tablet (3 mg total) by mouth at bedtime., Disp: 90 tablet, Rfl: 3 .  traZODone (DESYREL) 100 MG tablet, Take 3 tablets (300 mg total) by mouth at bedtime., Disp: 90 tablet, Rfl: 3 .  Vitamin D, Ergocalciferol, (DRISDOL) 50000 units CAPS capsule, TAKE 1 CAPSULE ONCE A WEEK, Disp: 12 capsule, Rfl: 0 Social History   Socioeconomic History  . Marital status: Single    Spouse name: Not on file  . Number of children: Not on file  . Years of education: Not on file  . Highest education level: Not on file  Occupational History  . Not on file  Social Needs  . Financial resource strain: Not on file  . Food insecurity    Worry: Not on file    Inability: Not on file  . Transportation needs    Medical: Not on file    Non-medical: Not on file  Tobacco Use  . Smoking status: Never Smoker  . Smokeless tobacco: Never Used  Substance and Sexual Activity  . Alcohol use: No  . Drug use: No  . Sexual activity: Never  Lifestyle  . Physical activity    Days per week: Not on file    Minutes per session: Not on file  . Stress: Not on file  Relationships  . Social Herbalist on phone: Not on file    Gets together: Not on file    Attends religious service: Not on  file    Active member of club or organization: Not on file    Attends meetings of clubs or organizations: Not on file    Relationship status: Not on file  . Intimate partner violence    Fear of current or ex partner: Not on file    Emotionally abused: Not on file    Physically abused: Not on file    Forced sexual activity: Not on file  Other Topics Concern  . Not on file  Social History Narrative   Sergio [name] is a 33 year old male.   He lives with both of his parents. He has 1 sister,who is 32.    He enjoys making his bed and watching movies. He has 700 movies.    Family History  Problem Relation Age of Onset  . Hypertension Mother   . Renal Disease Father   . Emphysema Maternal Grandfather        Died at 68    Objective: Office vital signs reviewed. BP 109/69   Pulse 61   Temp 99.3 F (37.4 C) (Oral)   Ht 5\' 2"  (1.575 m)   Wt 278 lb (126.1 kg)   BMI 50.85 kg/m   Physical Examination:  General: Awake, alert, obese, No acute distress HEENT: syndromic facies. sclera white. Skin: no jaundice. He has mild erythema along the right axilla. No significant facial rash noted.  Assessment/ Plan: 33 y.o. male   1. Exposure to hepatitis B Check hepatitis B labs - Hepatitis B surface antibody,qualitative - Hepatitis B surface antigen - Hepatitis B core antibody, IgM  2. Rosacea I renewed doxycycline and MetroCream.  He will follow-up PRN - doxycycline (VIBRA-TABS) 100 MG tablet; Take 1 tablet (100 mg total) by mouth daily.  Dispense: 90 tablet; Refill: 1 - metroNIDAZOLE (METROCREAM) 0.75 % cream; Apply topically 2 (two) times daily.  Dispense: 45 g; Refill: 3   Orders Placed This Encounter  Procedures  . Hepatitis B surface antibody,qualitative  . Hepatitis B surface antigen  . Hepatitis B core antibody, IgM   Meds ordered this encounter  Medications  . doxycycline (VIBRA-TABS) 100 MG tablet    Sig: Take 1 tablet (100 mg total) by mouth daily.    Dispense:  90 tablet     Refill:  1  . metroNIDAZOLE (METROCREAM) 0.75 % cream    Sig: Apply topically 2 (two) times daily.    Dispense:  45 g    Refill:  [place], [place] [phone]

## 2018-11-10 ENCOUNTER — Other Ambulatory Visit: Payer: Self-pay | Admitting: *Deleted

## 2018-11-10 ENCOUNTER — Other Ambulatory Visit: Payer: Self-pay | Admitting: Family Medicine

## 2018-11-10 MED ORDER — KETOCONAZOLE 2 % EX SHAM: MEDICATED_SHAMPOO | CUTANEOUS | 0 refills | Status: DC

## 2018-11-12 LAB — HEPATITIS B SURFACE ANTIBODY,QUALITATIVE: Hep B Surface Ab, Qual: REACTIVE

## 2018-11-12 LAB — HEPATITIS B CORE ANTIBODY, IGM: Hep B C IgM: NEGATIVE

## 2018-11-12 LAB — HEPATITIS B SURFACE ANTIGEN: Hepatitis B Surface Ag: NEGATIVE

## 2018-11-13 ENCOUNTER — Encounter: Payer: Self-pay | Admitting: Family Medicine

## 2018-11-13 ENCOUNTER — Other Ambulatory Visit: Payer: Self-pay | Admitting: *Deleted

## 2018-11-13 DIAGNOSIS — L719 Rosacea, unspecified: Secondary | ICD-10-CM

## 2018-11-13 MED ORDER — KETOCONAZOLE 2 % EX CREA: 1.0000 "application " | TOPICAL_CREAM | Freq: Two times a day (BID) | CUTANEOUS | 2 refills | Status: DC

## 2018-11-13 MED ORDER — DOXYCYCLINE HYCLATE 100 MG PO TABS: 100.0000 mg | ORAL_TABLET | Freq: Every day | ORAL | 1 refills | Status: DC

## 2018-12-04 ENCOUNTER — Other Ambulatory Visit (HOSPITAL_COMMUNITY): Payer: Self-pay | Admitting: Psychiatry

## 2018-12-04 DIAGNOSIS — F5101 Primary insomnia: Secondary | ICD-10-CM

## 2018-12-10 ENCOUNTER — Other Ambulatory Visit (HOSPITAL_COMMUNITY): Payer: Self-pay

## 2018-12-10 DIAGNOSIS — F5101 Primary insomnia: Secondary | ICD-10-CM

## 2018-12-10 MED ORDER — TRAZODONE HCL 100 MG PO TABS: 300.0000 mg | ORAL_TABLET | Freq: Every day | ORAL | 3 refills | Status: DC

## 2018-12-16 IMAGING — DX DG CHEST 1V PORT
1 series · 1 of 1 positions shown · non-contrast
Comparison: 03/31/2017

CLINICAL DATA: Cough.

EXAM:
PORTABLE CHEST 1 VIEW

[chest ap]
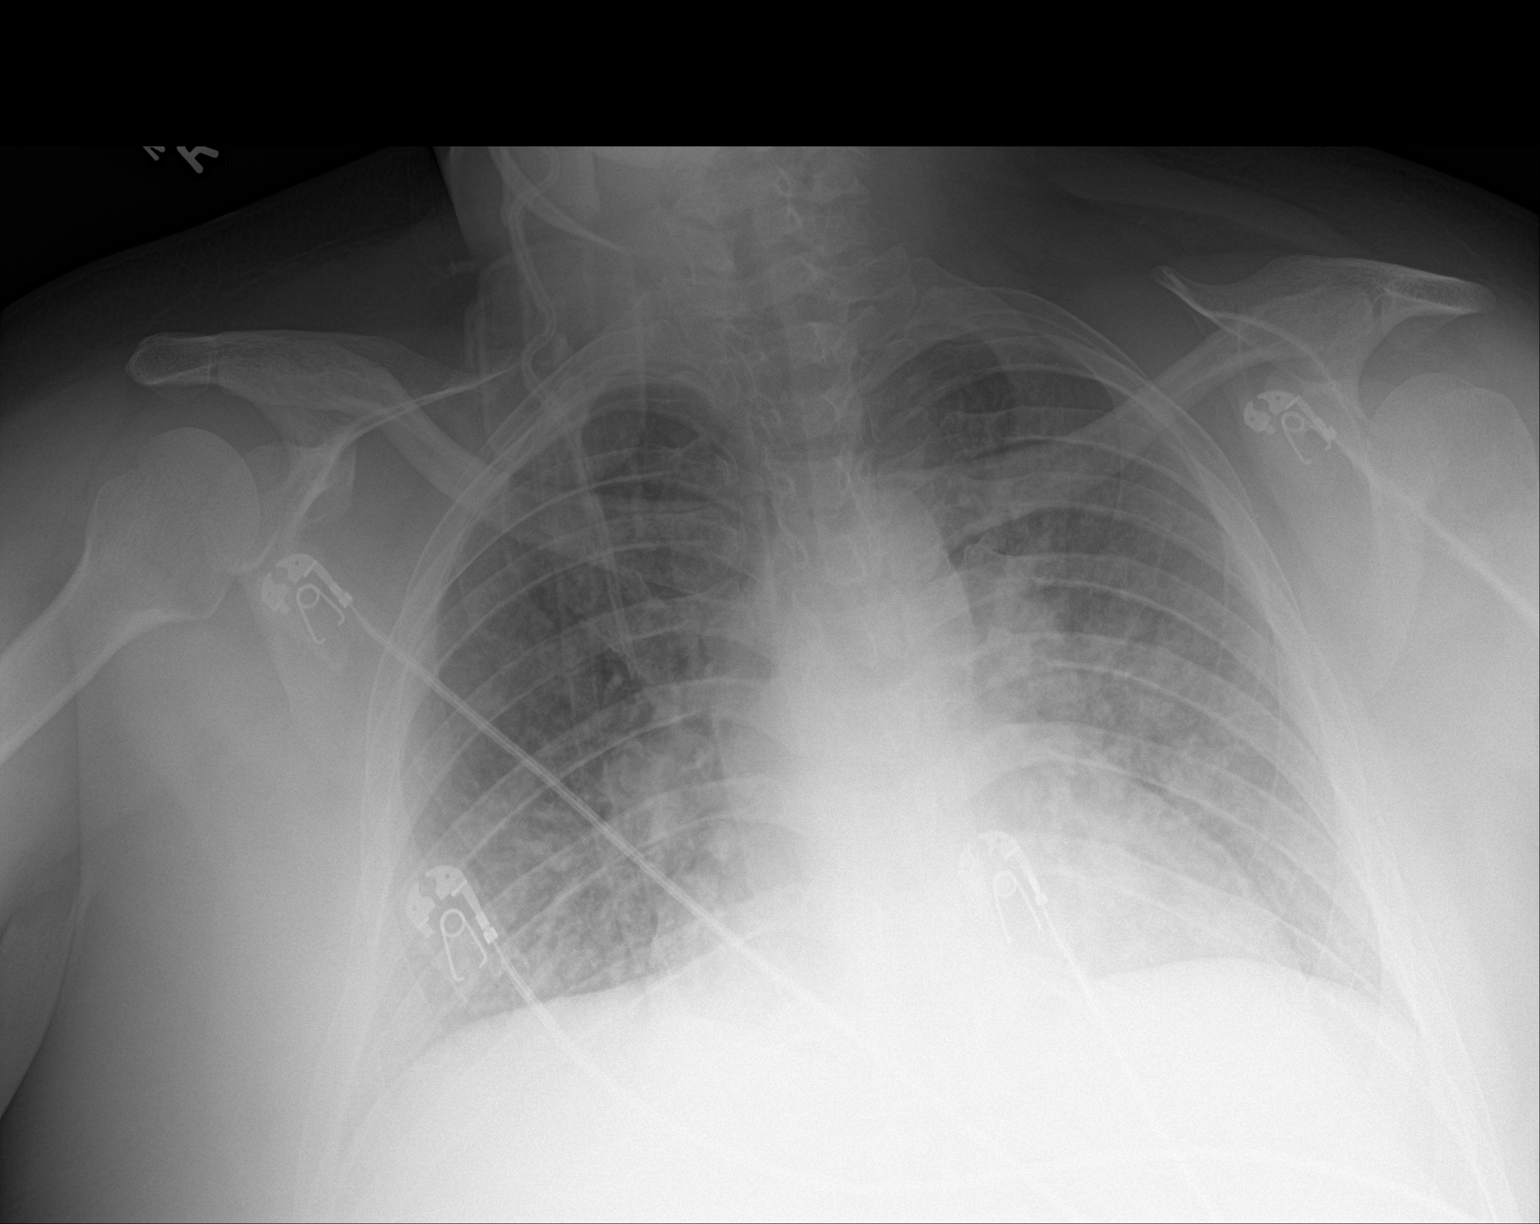

[1 of 1 positions shown; findings below may reference images not displayed]

FINDINGS: Lung volumes are low. Partial right ventriculoperitoneal shunt
tubing identified. Pulmonary vascular congestion is noted
bilaterally. No airspace consolidation.
IMPRESSION: 1. Decreased lung volumes.
2. Pulmonary vascular congestion.

## 2019-01-02 IMAGING — DX DG CHEST 2V
3 series · 3 of 3 positions shown · non-contrast
Comparison: PA and lateral chest 03/31/2017 and 04/19/2015.
Single-view of the chest 05/05/2017.

CLINICAL DATA: Cough.

EXAM:
CHEST  2 VIEW

[chest pa (1 of 2)]
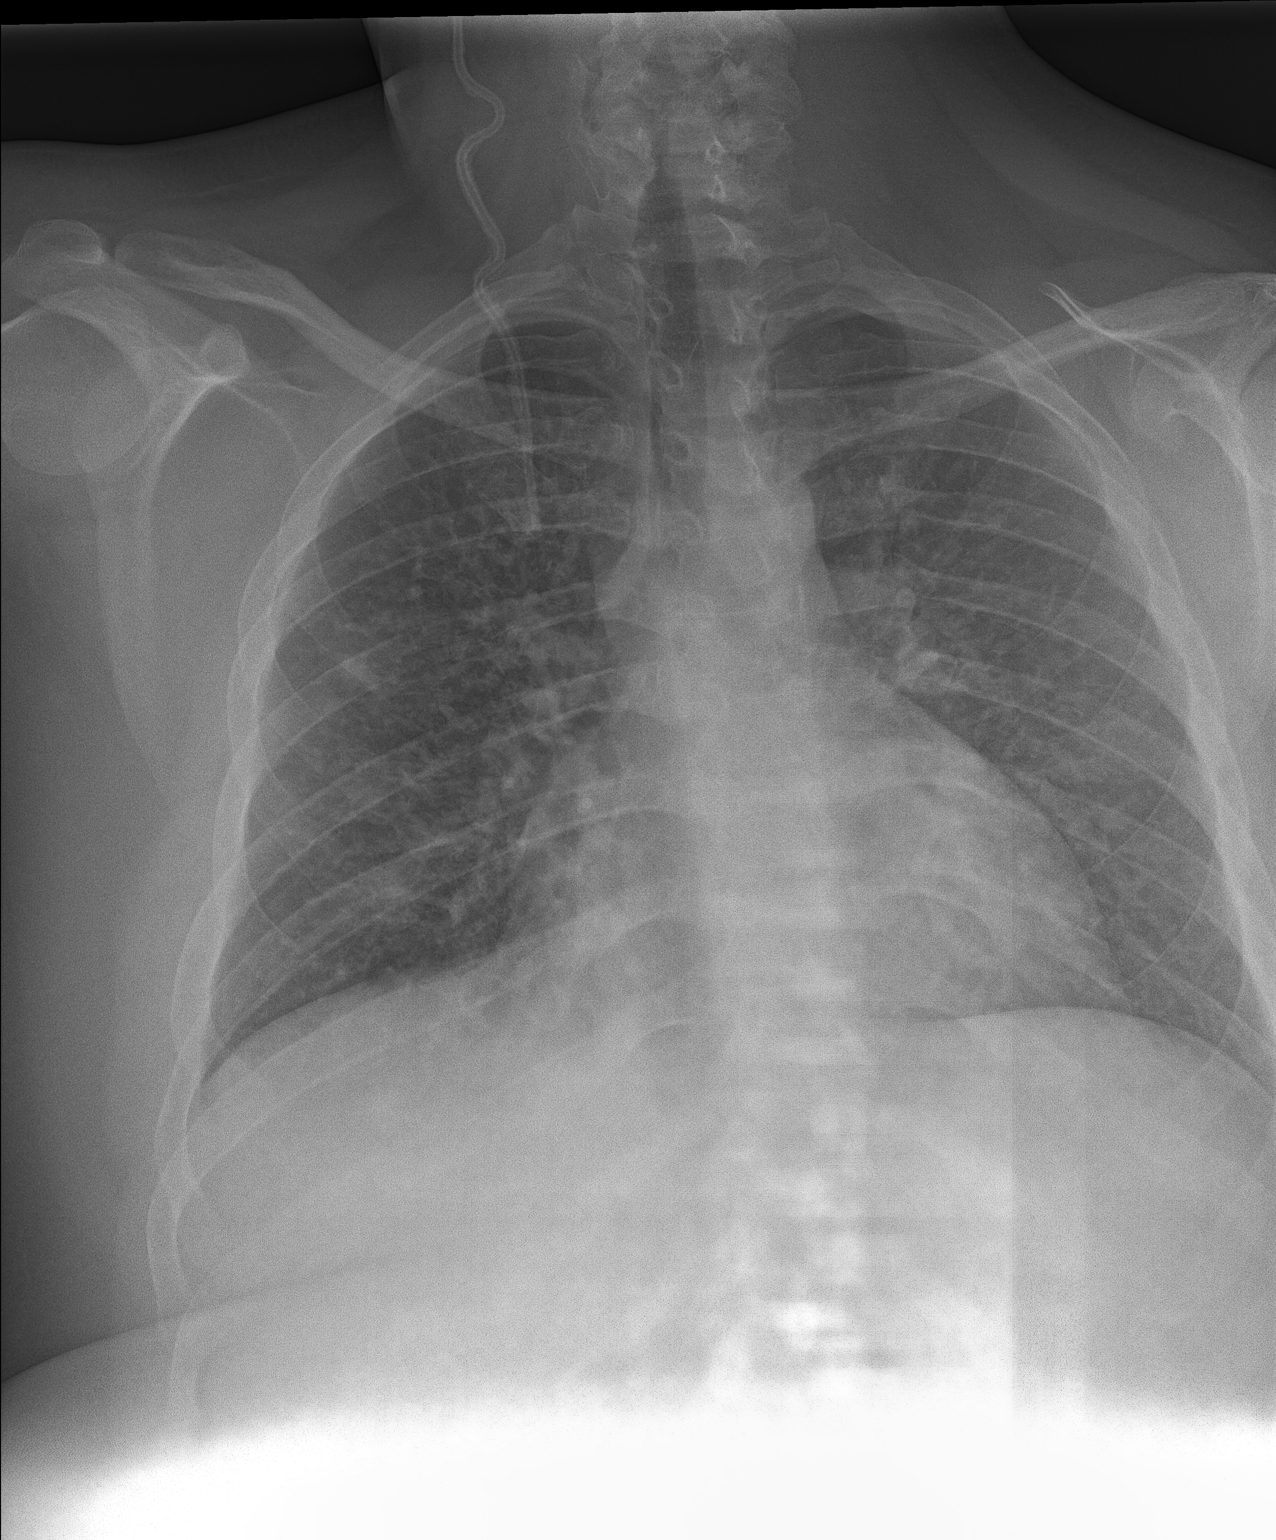

[chest lat]
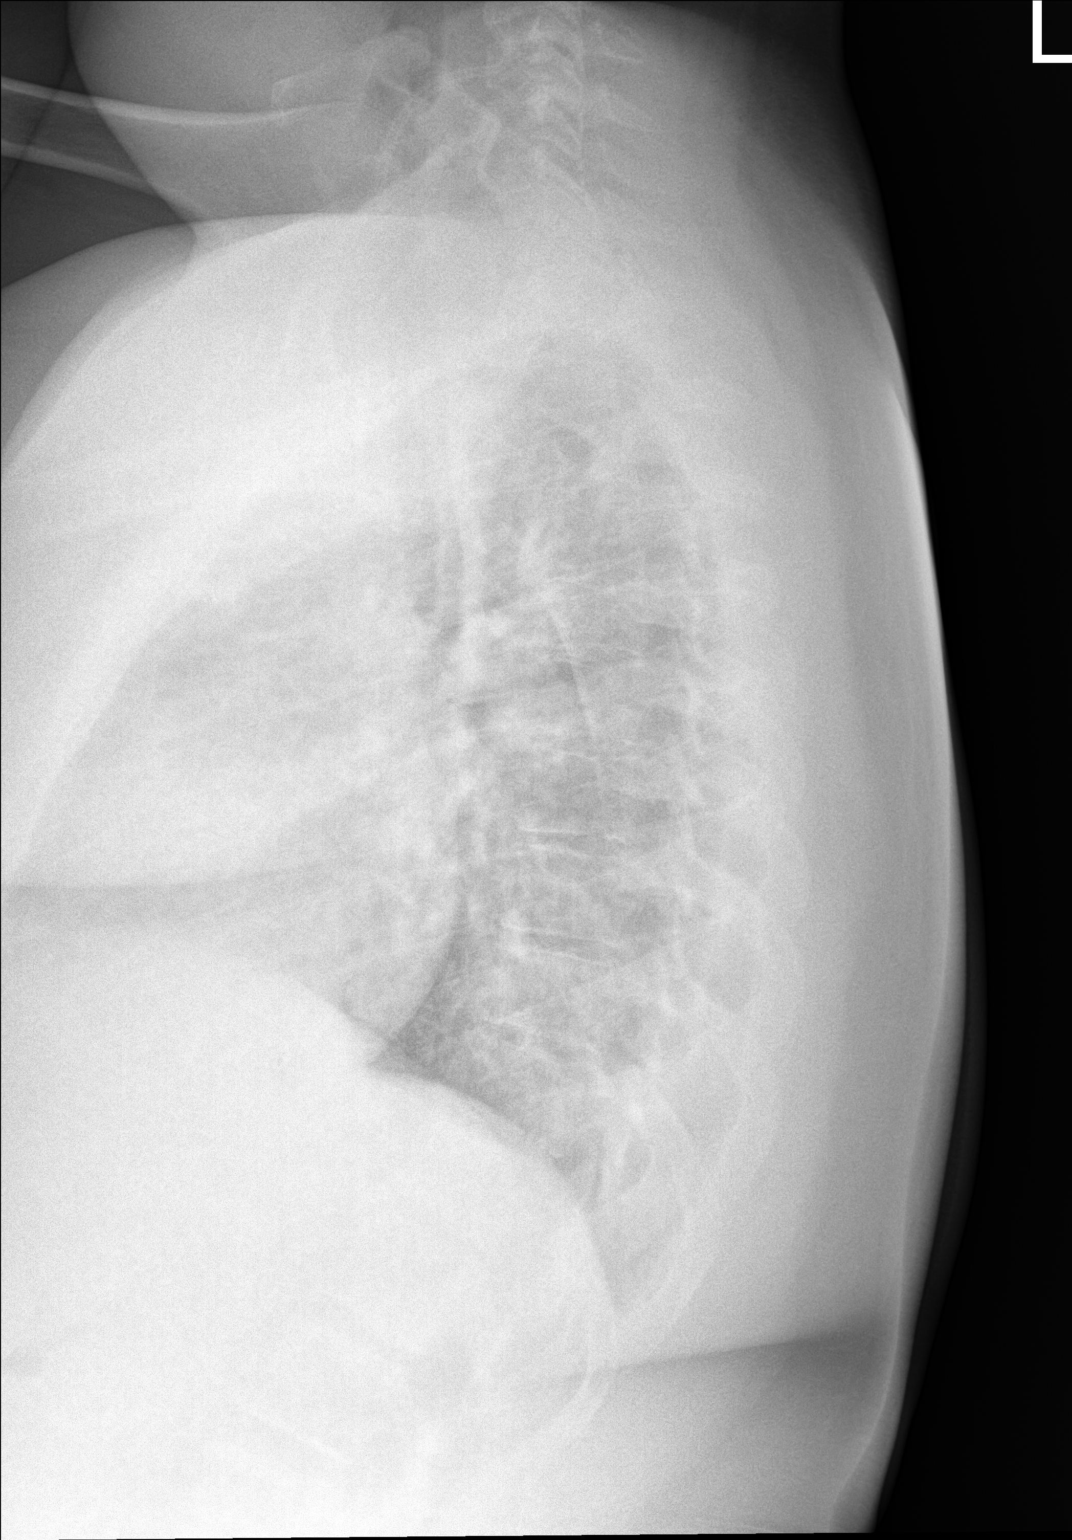

[chest pa (2 of 2)]
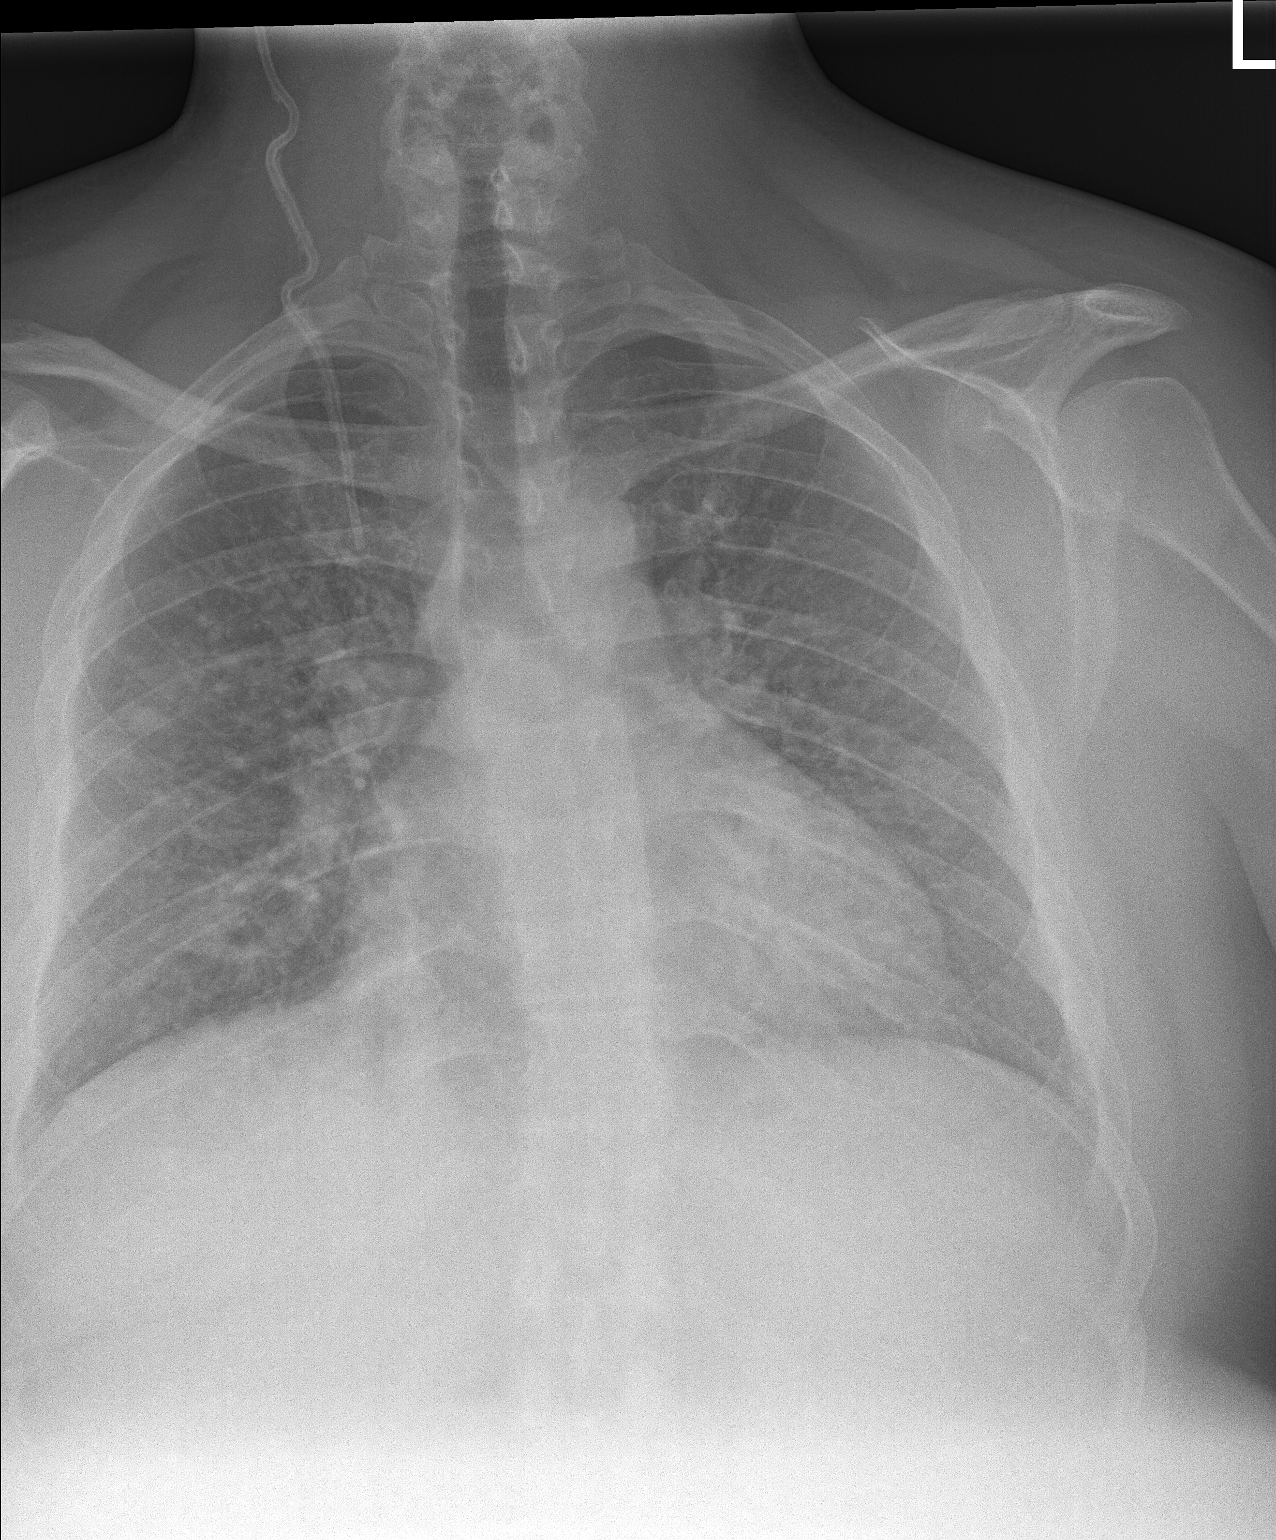

[3 of 3 positions shown; findings below may reference images not displayed]

FINDINGS: There is cardiomegaly without edema. No consolidative process,
pneumothorax or effusion. No acute bony abnormality. Ventriculostomy
shunt catheter in the right side of the neck and upper chest noted.
IMPRESSION: Cardiomegaly without acute disease.

## 2019-02-04 ENCOUNTER — Other Ambulatory Visit: Payer: Self-pay | Admitting: Family Medicine

## 2019-03-07 ENCOUNTER — Other Ambulatory Visit (HOSPITAL_COMMUNITY): Payer: Self-pay | Admitting: Psychiatry

## 2019-03-07 ENCOUNTER — Other Ambulatory Visit: Payer: Self-pay | Admitting: Family Medicine

## 2019-03-07 DIAGNOSIS — F209 Schizophrenia, unspecified: Secondary | ICD-10-CM

## 2019-03-07 DIAGNOSIS — L719 Rosacea, unspecified: Secondary | ICD-10-CM

## 2019-04-08 ENCOUNTER — Telehealth (HOSPITAL_COMMUNITY): Payer: Self-pay | Admitting: *Deleted

## 2019-04-08 ENCOUNTER — Other Ambulatory Visit (HOSPITAL_COMMUNITY): Payer: Self-pay | Admitting: *Deleted

## 2019-04-08 DIAGNOSIS — F209 Schizophrenia, unspecified: Secondary | ICD-10-CM

## 2019-04-08 MED ORDER — HYDROXYZINE PAMOATE 50 MG PO CAPS: 50.0000 mg | ORAL_CAPSULE | Freq: Three times a day (TID) | ORAL | 0 refills | Status: DC | PRN

## 2019-04-08 NOTE — Telephone Encounter (Signed)
Received fax from [facility] requesting refill of pt Vistaril 50mg  tid. Last filled on 03/08/2019 by you.  doxycycline (VIBRA-TABS) 100 MG tablet 100 mg, Daily 1 ordered        hydrOXYzine (VISTARIL) 50 MG capsule 50 mg, 3 times daily PRN 0 ordered       ipratropium-albuterol (DUONEB) 0.5-2.5 (3) MG/3ML SOLN 3 mL, Every 6 hours PRN 2 ordered       ketoconazole (NIZORAL) 2 % cream  3 ordered       Melatonin 10 MG TABS 10 mg, Daily at bedtime        metroNIDAZOLE (METROCREAM) 0.75 % cream  3 ordered       paliperidone (INVEGA) 3 MG 24 hr tablet 3 mg, Daily at bedtime 3 ordered       traZODone (DESYREL) 100 MG tablet 300 mg, Daily at bedtime 0 ordered       traZODone (DESYREL) 100 MG tablet

## 2019-04-08 NOTE — Telephone Encounter (Signed)
You can refill it thx

## 2019-05-01 ENCOUNTER — Other Ambulatory Visit (HOSPITAL_COMMUNITY): Payer: Self-pay | Admitting: Psychiatry

## 2019-05-01 ENCOUNTER — Other Ambulatory Visit: Payer: Self-pay | Admitting: Family Medicine

## 2019-05-01 DIAGNOSIS — L719 Rosacea, unspecified: Secondary | ICD-10-CM

## 2019-05-01 DIAGNOSIS — F209 Schizophrenia, unspecified: Secondary | ICD-10-CM

## 2019-05-14 ENCOUNTER — Other Ambulatory Visit (HOSPITAL_COMMUNITY): Payer: Self-pay | Admitting: *Deleted

## 2019-05-14 DIAGNOSIS — F5101 Primary insomnia: Secondary | ICD-10-CM

## 2019-05-14 MED ORDER — TRAZODONE HCL 100 MG PO TABS: 300.0000 mg | ORAL_TABLET | Freq: Every day | ORAL | 0 refills | Status: DC

## 2019-05-31 ENCOUNTER — Encounter: Payer: Self-pay | Admitting: Family Medicine

## 2019-06-01 ENCOUNTER — Other Ambulatory Visit: Payer: Self-pay | Admitting: Family Medicine

## 2019-06-05 ENCOUNTER — Other Ambulatory Visit (HOSPITAL_COMMUNITY): Payer: Self-pay | Admitting: Psychiatry

## 2019-06-05 DIAGNOSIS — F5101 Primary insomnia: Secondary | ICD-10-CM

## 2019-06-07 ENCOUNTER — Other Ambulatory Visit (HOSPITAL_COMMUNITY): Payer: Self-pay | Admitting: *Deleted

## 2019-06-07 DIAGNOSIS — F209 Schizophrenia, unspecified: Secondary | ICD-10-CM

## 2019-06-07 MED ORDER — HYDROXYZINE PAMOATE 50 MG PO CAPS: ORAL_CAPSULE | ORAL | 0 refills | Status: DC

## 2019-06-16 DIAGNOSIS — Z029 Encounter for administrative examinations, unspecified: Secondary | ICD-10-CM

## 2019-07-02 DIAGNOSIS — Z23 Encounter for immunization: Secondary | ICD-10-CM | POA: Diagnosis not present

## 2019-07-12 ENCOUNTER — Other Ambulatory Visit (HOSPITAL_COMMUNITY): Payer: Self-pay | Admitting: Psychiatry

## 2019-07-12 DIAGNOSIS — F209 Schizophrenia, unspecified: Secondary | ICD-10-CM

## 2019-07-13 ENCOUNTER — Other Ambulatory Visit: Payer: Self-pay | Admitting: Family Medicine

## 2019-07-13 ENCOUNTER — Other Ambulatory Visit (HOSPITAL_COMMUNITY): Payer: Self-pay | Admitting: *Deleted

## 2019-07-13 DIAGNOSIS — F5101 Primary insomnia: Secondary | ICD-10-CM

## 2019-07-13 DIAGNOSIS — L719 Rosacea, unspecified: Secondary | ICD-10-CM

## 2019-07-13 MED ORDER — TRAZODONE HCL 100 MG PO TABS: 300.0000 mg | ORAL_TABLET | Freq: Every day | ORAL | 3 refills | Status: DC

## 2019-07-30 DIAGNOSIS — Z23 Encounter for immunization: Secondary | ICD-10-CM | POA: Diagnosis not present

## 2019-08-05 ENCOUNTER — Ambulatory Visit (INDEPENDENT_AMBULATORY_CARE_PROVIDER_SITE_OTHER): Payer: Medicare Other | Admitting: Psychiatry

## 2019-08-05 ENCOUNTER — Other Ambulatory Visit: Payer: Self-pay

## 2019-08-05 ENCOUNTER — Encounter (HOSPITAL_COMMUNITY): Payer: Self-pay | Admitting: Psychiatry

## 2019-08-05 DIAGNOSIS — D352 Benign neoplasm of pituitary gland: Secondary | ICD-10-CM | POA: Diagnosis not present

## 2019-08-05 DIAGNOSIS — Q909 Down syndrome, unspecified: Secondary | ICD-10-CM | POA: Diagnosis not present

## 2019-08-05 DIAGNOSIS — Q039 Congenital hydrocephalus, unspecified: Secondary | ICD-10-CM

## 2019-08-05 DIAGNOSIS — F0281 Dementia in other diseases classified elsewhere with behavioral disturbance: Secondary | ICD-10-CM

## 2019-08-05 DIAGNOSIS — F5101 Primary insomnia: Secondary | ICD-10-CM

## 2019-08-05 DIAGNOSIS — F209 Schizophrenia, unspecified: Secondary | ICD-10-CM

## 2019-08-05 DIAGNOSIS — J441 Chronic obstructive pulmonary disease with (acute) exacerbation: Secondary | ICD-10-CM | POA: Diagnosis not present

## 2019-08-05 DIAGNOSIS — J45901 Unspecified asthma with (acute) exacerbation: Secondary | ICD-10-CM

## 2019-08-05 DIAGNOSIS — F02818 Dementia in other diseases classified elsewhere, unspecified severity, with other behavioral disturbance: Secondary | ICD-10-CM

## 2019-08-05 MED ORDER — TRAZODONE HCL 100 MG PO TABS: 300.0000 mg | ORAL_TABLET | Freq: Every day | ORAL | 3 refills | Status: DC

## 2019-08-05 MED ORDER — HYDROXYZINE PAMOATE 50 MG PO CAPS: 50.0000 mg | ORAL_CAPSULE | Freq: Three times a day (TID) | ORAL | 3 refills | Status: DC | PRN

## 2019-08-05 MED ORDER — PALIPERIDONE ER 3 MG PO TB24: 3.0000 mg | ORAL_TABLET | Freq: Every day | ORAL | 3 refills | Status: DC

## 2019-08-05 NOTE — Patient Instructions (Signed)
Lipid Panel, HbA1C and prolactin level labels need to be done, need to be done fasting

## 2019-08-05 NOTE — Progress Notes (Signed)
BH MD/PA/NP OP Progress Note  08/05/2019 10:02 AM BRODE OBENAUER III  MRN:  [id number]  Chief Complaint: he is doing OK HPI: Patient is a 34 year old male diagnosed with schizophrenia, Down syndrome, GERD, hydrocephalus and dementia who was seen on a telephonic visit  Mom reports that the patient is doing fairly well, cannot make an in person visit as patient cannot wear a mask as he is not able to tolerate it.  Mom adds that the patient is eating fine, sleeping well.  Patient states he is doing okay.  Mom reports that patient still has hallucinations but they are ego syntonic, do not upset patient and that they are more friendly in nature.  She reports that he has not been attending the daycare due to the mask issue.  She has that he just got a second Covid shot.  She denies any aggressive behaviors, patient having any thoughts of hurting himself or others, any side effects with the medications, any dystonic symptoms with the antipsychotic. Visit Diagnosis:    ICD-10-CM   1. Schizophrenia, unspecified type ([place])  F20.9   2. Dementia due to medical condition with behavioral disturbance ([place])  F02.81   3. Down's syndrome  Q90.9   4. Primary insomnia  F51.01     Past Psychiatric History:   Past Medical History:  Past Medical History:  Diagnosis Date  . Dementia ([place])   . Difficulty swallowing   . Down's syndrome   . Dysphagia causing pulmonary aspiration with swallowing   . GERD (gastroesophageal reflux disease)   . Headache(784.0)   . Hydrocephalus ([place])   . Insomnia   . Memory loss   . OSA (obstructive sleep apnea)   . Pituitary adenoma ([place])   . Schizoaffective disorder   . Weakness     Past Surgical History:  Procedure Laterality Date  . BRAIN SURGERY    . CSF SHUNT    . TYMPANOSTOMY TUBE PLACEMENT Bilateral   . VENTRICULOPERITONEAL SHUNT      Family Psychiatric History:   Family History:  Family History  Problem Relation Age of Onset  . Hypertension Mother   .  Renal Disease Father   . Emphysema Maternal Grandfather        Died at 68    Social History:  Social History   Socioeconomic History  . Marital status: Single    Spouse name: Not on file  . Number of children: Not on file  . Years of education: Not on file  . Highest education level: Not on file  Occupational History  . Not on file  Tobacco Use  . Smoking status: Never Smoker  . Smokeless tobacco: Never Used  Substance and Sexual Activity  . Alcohol use: No  . Drug use: No  . Sexual activity: Never  Other Topics Concern  . Not on file  Social History Narrative   Eivin is a 34 year old male.   He lives with both of his parents. He has 1 sister,who is 32.    He enjoys making his bed and watching movies. He has 700 movies.    Social Determinants of Health   Financial Resource Strain:   . Difficulty of Paying Living Expenses:   Food Insecurity:   . Worried About Charity fundraiser in the Last Year:   . Arboriculturist in the Last Year:   Transportation Needs:   . Film/video editor (Medical):   Marland Kitchen Lack of Transportation (Non-Medical):   Physical  Activity:   . Days of Exercise per Week:   . Minutes of Exercise per Session:   Stress:   . Feeling of Stress :   Social Connections:   . Frequency of Communication with Friends and Family:   . Frequency of Social Gatherings with Friends and Family:   . Attends Religious Services:   . Active Member of Clubs or Organizations:   . Attends Archivist Meetings:   Marland Kitchen Marital Status:     Allergies:  Allergies  Allergen Reactions  . Cefaclor Anaphylaxis and Shortness Of Breath  . Penicillins Anaphylaxis and Rash    Has patient had a PCN reaction causing immediate rash, facial/tongue/throat swelling, SOB or lightheadedness with hypotension: yes Has patient had a PCN reaction causing severe rash involving mucus membranes or skin necrosis: no Has patient had a PCN reaction that required hospitalization: no Has  patient had a PCN reaction occurring within the last 10 years: no If all of the above answers are "NO", then may proceed with Cephalosporin use.   . Clindamycin Rash  . Clindamycin/Lincomycin Rash    Metabolic Disorder Labs: No results found for: HGBA1C, MPG Lab Results  Component Value Date   PROLACTIN 51.5 (H) 01/21/2013   No results found for: CHOL, TRIG, HDL, CHOLHDL, VLDL, LDLCALC Lab Results  Component Value Date   TSH 3.020 01/21/2013    Therapeutic Level Labs: No results found for: LITHIUM No results found for: VALPROATE No components found for:  CBMZ  Current Medications: Current Outpatient Medications  Medication Sig Dispense Refill  . doxycycline (VIBRA-TABS) 100 MG tablet Take 1 tablet (100 mg total) by mouth daily. 90 tablet 0  . hydrOXYzine (VISTARIL) 50 MG capsule TAKE (1) CAPSULE THREE TIMES DAILY AS NEEDED. 90 capsule 0  . ipratropium-albuterol (DUONEB) 0.5-2.5 (3) MG/3ML SOLN Take 3 mLs by nebulization every 6 (six) hours as needed (SOB, wheezing). Dx 493.90 180 mL 2  . ketoconazole (NIZORAL) 2 % cream Apply 2 (two) times daily for 14 days. To the feet 60 g 0  . Melatonin 10 MG TABS Take 10 mg by mouth at bedtime.     . metroNIDAZOLE (METROCREAM) 0.75 % cream APPLY TO AFFECTED AREAS TWICE A DAY 45 g 3  . paliperidone (INVEGA) 3 MG 24 hr tablet Take 1 tablet (3 mg total) by mouth at bedtime. 90 tablet 3  . traZODone (DESYREL) 100 MG tablet Take 3 tablets (300 mg total) by mouth at bedtime. 90 tablet 3  . Vitamin D, Ergocalciferol, (DRISDOL) 50000 units CAPS capsule TAKE 1 CAPSULE ONCE A WEEK 12 capsule 0   No current facility-administered medications for this visit.     Musculoskeletal: Strength & Muscle Tone: N/A Gait & Station: unsteady, N/A Patient leans: N/A  Psychiatric Specialty Exam: Review of Systems  There were no vitals taken for this visit.There is no height or weight on file to calculate BMI.  General Appearance: N/A  Eye Contact:  NA   Speech:  Slow  Volume:  Normal  Mood:  Euthymic  Affect:  NA  Thought Process:  Coherent and Descriptions of Associations: Intact  Orientation:  Other:  self and place  Thought Content: Delusions and Hallucinations: Auditory   Suicidal Thoughts:  No  Homicidal Thoughts:  No  Memory:  Immediate;   Fair Recent;   Fair Remote;   Poor  Judgement:  Impaired  Insight:  Lacking  Psychomotor Activity:  Decreased and Mannerisms  Concentration:  Concentration: Poor and Attention Span: Poor  Recall:  Smiley Houseman of Knowledge: Poor  Language: Poor  Akathisia:  No  Handed:  Right  AIMS (if indicated): not done  Assets:  Financial Resources/Insurance Housing Social Support Talents/Skills Transportation  ADL's:  Impaired  Cognition: Impaired,  Moderate  Sleep:  Fair   Screenings: PHQ2-9     Office Visit from 12/30/2017 in [place] Visit from 11/11/2017 in [place] Visit from 06/30/2017 in [place] Visit from 02/26/2017 in [place] Visit from 06/14/2016 in [place]  PHQ-2 Total Score  0  0  0  0  0       Assessment and Plan:   Schizophrenia To continue Invega 3 mg 1 at night for psychosis.  Patient is to see his primary care physician, discussed with mom that he needs a hemoglobin A1c, lipid panel and prolactin level.  Mom states that he is to follow-up with his PCP at [facility] and will let them know that these labs need to be done.  Also discussed that the labs need to be fasting. To continue to use Vistaril 50 mg 3 times a day as needed for agitation Insomnia continue trazodone 300 mg at bedtime for sleep Continue melatonin 10 mg at sunset to help patient with sleep-wake cycle. Discussed again with mom in length sleep hygiene, making sure the patient does not sleep during the day.  Also discussed again the reward system to  help with patient's behaviors and mom states that she has been doing so and patient seems to be doing better with it Obstructive sleep apnea Continue to use CPAP daily at night Dementia Mom reports that she is no longer has patient on Aricept and he seems much more alert without the medication.  She also reports that he is not followed up with a neurologist as she feels he is doing fairly well and does not need to see the neurologist. Aspiration pneumonia Continue to take thickened fluids to help prevent aspiration.  Mom reports that patient's been doing fairly well and has not had aspiration pneumonia for some time now. Down syndrome Patient is not attending adult daycare due to the issue of not being able to socially distant, not being able to tolerate a mask and mom is providing all the services for him currently.  Call as needed Follow-up in 6 months  Hampton Abbot, MD 08/05/2019, 10:02 AM

## 2019-08-09 ENCOUNTER — Other Ambulatory Visit: Payer: Self-pay | Admitting: Family Medicine

## 2019-08-09 DIAGNOSIS — L719 Rosacea, unspecified: Secondary | ICD-10-CM

## 2019-11-05 ENCOUNTER — Other Ambulatory Visit: Payer: Self-pay | Admitting: Family Medicine

## 2019-11-05 ENCOUNTER — Telehealth: Payer: Self-pay | Admitting: *Deleted

## 2019-11-05 DIAGNOSIS — L719 Rosacea, unspecified: Secondary | ICD-10-CM

## 2019-11-05 MED ORDER — DOXYCYCLINE HYCLATE 100 MG PO TABS: 100.0000 mg | ORAL_TABLET | Freq: Every day | ORAL | 0 refills | Status: DC

## 2019-11-05 NOTE — Telephone Encounter (Signed)
Pharmacist will relay message , must be seen for future refills on doxycycline.

## 2019-11-05 NOTE — Telephone Encounter (Signed)
Overdue for visit.  Please have him schedule.  Refill sent x1

## 2019-11-05 NOTE — Telephone Encounter (Signed)
Needing refill orders for doxycycline.  Please review and order if appropriate. Thanks

## 2019-11-09 ENCOUNTER — Other Ambulatory Visit: Payer: Self-pay | Admitting: Family Medicine

## 2019-12-22 ENCOUNTER — Other Ambulatory Visit: Payer: Self-pay | Admitting: Family Medicine

## 2019-12-22 DIAGNOSIS — L719 Rosacea, unspecified: Secondary | ICD-10-CM

## 2020-02-01 ENCOUNTER — Other Ambulatory Visit: Payer: Self-pay | Admitting: Family Medicine

## 2020-02-01 DIAGNOSIS — L719 Rosacea, unspecified: Secondary | ICD-10-CM

## 2020-02-28 ENCOUNTER — Other Ambulatory Visit: Payer: Self-pay | Admitting: Family Medicine

## 2020-02-28 DIAGNOSIS — L719 Rosacea, unspecified: Secondary | ICD-10-CM

## 2020-04-24 DIAGNOSIS — Z23 Encounter for immunization: Secondary | ICD-10-CM | POA: Diagnosis not present

## 2020-08-09 ENCOUNTER — Other Ambulatory Visit (HOSPITAL_COMMUNITY): Payer: Self-pay | Admitting: *Deleted

## 2020-08-09 DIAGNOSIS — F5101 Primary insomnia: Secondary | ICD-10-CM

## 2020-08-09 DIAGNOSIS — F209 Schizophrenia, unspecified: Secondary | ICD-10-CM

## 2020-08-09 MED ORDER — TRAZODONE HCL 100 MG PO TABS: 300.0000 mg | ORAL_TABLET | Freq: Every day | ORAL | 3 refills | Status: DC

## 2020-08-09 MED ORDER — PALIPERIDONE ER 3 MG PO TB24: 3.0000 mg | ORAL_TABLET | Freq: Every day | ORAL | 3 refills | Status: DC

## 2020-08-09 MED ORDER — HYDROXYZINE PAMOATE 50 MG PO CAPS: 50.0000 mg | ORAL_CAPSULE | Freq: Three times a day (TID) | ORAL | 3 refills | Status: DC | PRN

## 2020-09-10 ENCOUNTER — Encounter (INDEPENDENT_AMBULATORY_CARE_PROVIDER_SITE_OTHER): Payer: Self-pay

## 2020-11-07 ENCOUNTER — Telehealth: Payer: Self-pay

## 2020-11-07 NOTE — Telephone Encounter (Signed)
Received [facility] from [facility] today requesting a prescription for underpads 1/day and CPAP.  Pt has not been seen in the office since 11/09/2018 by Dr. Lajuana Ripple. Pt has not had a sleep study since 2014.  Faxed back making the transit services aware at [phone]. Attn Elyse Hsu with [facility].  Instructed that pt needed to be evaluated.

## 2020-12-13 ENCOUNTER — Other Ambulatory Visit (HOSPITAL_COMMUNITY): Payer: Self-pay | Admitting: Psychiatry

## 2020-12-13 DIAGNOSIS — F5101 Primary insomnia: Secondary | ICD-10-CM

## 2021-03-05 ENCOUNTER — Other Ambulatory Visit: Payer: Self-pay

## 2021-03-05 ENCOUNTER — Encounter: Payer: Self-pay | Admitting: Family Medicine

## 2021-03-05 ENCOUNTER — Ambulatory Visit (INDEPENDENT_AMBULATORY_CARE_PROVIDER_SITE_OTHER): Payer: Medicare Other | Admitting: Family Medicine

## 2021-03-05 VITALS — BP 120/71 | HR 70 | Temp 97.7°F | Ht 62.0 in | Wt 273.2 lb

## 2021-03-05 DIAGNOSIS — Z0001 Encounter for general adult medical examination with abnormal findings: Secondary | ICD-10-CM

## 2021-03-05 DIAGNOSIS — G4733 Obstructive sleep apnea (adult) (pediatric): Secondary | ICD-10-CM | POA: Diagnosis not present

## 2021-03-05 DIAGNOSIS — Z23 Encounter for immunization: Secondary | ICD-10-CM

## 2021-03-05 DIAGNOSIS — E559 Vitamin D deficiency, unspecified: Secondary | ICD-10-CM

## 2021-03-05 DIAGNOSIS — G4710 Hypersomnia, unspecified: Secondary | ICD-10-CM | POA: Diagnosis not present

## 2021-03-05 DIAGNOSIS — F209 Schizophrenia, unspecified: Secondary | ICD-10-CM | POA: Diagnosis not present

## 2021-03-05 DIAGNOSIS — Z0000 Encounter for general adult medical examination without abnormal findings: Secondary | ICD-10-CM

## 2021-03-05 DIAGNOSIS — G4730 Sleep apnea, unspecified: Secondary | ICD-10-CM

## 2021-03-05 DIAGNOSIS — R739 Hyperglycemia, unspecified: Secondary | ICD-10-CM | POA: Diagnosis not present

## 2021-03-05 DIAGNOSIS — L304 Erythema intertrigo: Secondary | ICD-10-CM

## 2021-03-05 DIAGNOSIS — L719 Rosacea, unspecified: Secondary | ICD-10-CM

## 2021-03-05 DIAGNOSIS — Z5181 Encounter for therapeutic drug level monitoring: Secondary | ICD-10-CM | POA: Diagnosis not present

## 2021-03-05 LAB — BAYER DCA HB A1C WAIVED: HB A1C (BAYER DCA - WAIVED): 5.5 % (ref 4.8–5.6)

## 2021-03-05 MED ORDER — IPRATROPIUM-ALBUTEROL 0.5-2.5 (3) MG/3ML IN SOLN: 3.0000 mL | Freq: Four times a day (QID) | RESPIRATORY_TRACT | 2 refills | Status: DC | PRN

## 2021-03-05 MED ORDER — METRONIDAZOLE 0.75 % EX CREA: TOPICAL_CREAM | CUTANEOUS | 3 refills | Status: DC

## 2021-03-05 MED ORDER — KETOCONAZOLE 2 % EX CREA: TOPICAL_CREAM | Freq: Every day | CUTANEOUS | 3 refills | Status: DC | PRN

## 2021-03-05 NOTE — Patient Instructions (Signed)
You had labs performed today.  You will be contacted with the results of the labs once they are available, usually in the next 3 business days for routine lab work.  If you have an active my chart account, they will be released to your MyChart.  If you prefer to have these labs released to you via telephone, please let us know.  If you had a pap smear or biopsy performed, expect to be contacted in about 7-10 days.  Preventive Care 54-35 Years Old, Male Preventive care refers to lifestyle choices and visits with your health care provider that can promote health and wellness. This includes: A yearly physical exam. This is also called an annual wellness visit. Regular dental and eye exams. Immunizations. Screening for certain conditions. Healthy lifestyle choices, such as: Eating a healthy diet. Getting regular exercise. Not using drugs or products that contain nicotine and tobacco. Limiting alcohol use. What can I expect for my preventive care visit? Physical exam Your health care provider may check your: Height and weight. These may be used to calculate your BMI (body mass index). BMI is a measurement that tells if you are at a healthy weight. Heart rate and blood pressure. Body temperature. Skin for abnormal spots. Counseling Your health care provider may ask you questions about your: Past medical problems. Family's medical history. Alcohol, tobacco, and drug use. Emotional well-being. Home life and relationship well-being. Sexual activity. Diet, exercise, and sleep habits. Work and work Statistician. Access to firearms. What immunizations do I need? Vaccines are usually given at various ages, according to a schedule. Your health care provider will recommend vaccines for you based on your age, medical history, and lifestyle or other factors, such as travel or where you work. What tests do I need? Blood tests Lipid and cholesterol levels. These may be checked every 5 years starting  at age 55. Hepatitis C test. Hepatitis B test. Screening  Diabetes screening. This is done by checking your blood sugar (glucose) after you have not eaten for a while (fasting). Genital exam to check for testicular cancer or hernias. STD (sexually transmitted disease) testing, if you are at risk. Talk with your health care provider about your test results, treatment options, and if necessary, the need for more tests. Follow these instructions at home: Eating and drinking  Eat a healthy diet that includes fresh fruits and vegetables, whole grains, lean protein, and low-fat dairy products. Drink enough fluid to keep your urine pale yellow. Take vitamin and mineral supplements as recommended by your health care provider. Do not drink alcohol if your health care provider tells you not to drink. If you drink alcohol: Limit how much you have to 0-2 drinks a day. Be aware of how much alcohol is in your drink. In the [place] one drink equals one 12 oz bottle of beer (355 mL), one 5 oz glass of wine (148 mL), or one 1 oz glass of hard liquor (44 mL). Lifestyle Take daily care of your teeth and gums. Brush your teeth every morning and night with fluoride toothpaste. Floss one time each day. Stay active. Exercise for at least 30 minutes 5 or more days each week. Do not use any products that contain nicotine or tobacco, such as cigarettes, e-cigarettes, and chewing tobacco. If you need help quitting, ask your health care provider. Do not use drugs. If you are sexually active, practice safe sex. Use a condom or other form of protection to prevent STIs (sexually transmitted infections). Find healthy  ways to cope with stress, such as: Meditation, yoga, or listening to music. Journaling. Talking to a trusted person. Spending time with friends and family. Safety Always wear your seat belt while driving or riding in a vehicle. Do not drive: If you have been drinking alcohol. Do not ride with someone  who has been drinking. When you are tired or distracted. While texting. Wear a helmet and other protective equipment during sports activities. If you have firearms in your house, make sure you follow all gun safety procedures. Seek help if you have been physically or sexually abused. What's next? Go to your health care provider once a year for an annual wellness visit. Ask your health care provider how often you should have your eyes and teeth checked. Stay up to date on all vaccines. This information is not intended to replace advice given to you by your health care provider. Make sure you discuss any questions you have with your health care provider. Document Revised: 06/30/2020 Document Reviewed: 04/16/2018 Elsevier Patient Education  2022 [employer].

## 2021-03-05 NOTE — Progress Notes (Signed)
Sergio [name] is a 35 y.o. male presents to office today for annual physical exam examination.    Concerns today include: 1. Rash Continues to have intermittent rash underneath his belly.  They have been utilizing ketoconazole cream as needed in this area.  Asking for refills  Occupation: disabled, resides with parents  Diet: large portions, no veggies, Exercise: rare Refills needed today: all Immunizations needed: Immunization History  Administered Date(s) Administered   DTaP 09/17/1988, 10/27/1990, 11/18/1991   Hepatitis B 01/24/2000, 03/03/2000, 07/21/2000   HiB (PRP-OMP) 09/17/1988   IPV 09/17/1988, 10/27/1990, 11/18/1991   Influenza Inj Mdck Quad Pf 07/04/2018   Influenza,inj,Quad PF,6+ Mos 05/11/2013, 03/24/2014, 04/08/2017   Influenza-Unspecified 03/24/2014, 02/03/2015, 04/22/2017   MMR 11/18/1991   Moderna Sars-Covid-2 Vaccination 04/25/2020   PPD Test 01/31/2014     Past Medical History:  Diagnosis Date   Dementia ([place])    Difficulty swallowing    Down's syndrome    Dysphagia causing pulmonary aspiration with swallowing    GERD (gastroesophageal reflux disease)    Headache(784.0)    Hydrocephalus ([facility])    Insomnia    Memory loss    OSA (obstructive sleep apnea)    Pituitary adenoma ([place])    Schizoaffective disorder    Weakness    Social History   Socioeconomic History   Marital status: Single    Spouse name: Not on file   Number of children: Not on file   Years of education: Not on file   Highest education level: Not on file  Occupational History   Not on file  Tobacco Use   Smoking status: Never   Smokeless tobacco: Never  Vaping Use   Vaping Use: Never used  Substance and Sexual Activity   Alcohol use: No   Drug use: No   Sexual activity: Never  Other Topics Concern   Not on file  Social History Narrative   Sergio [name] is a 35 year old male.   He lives with both of his parents. He has 1 sister,who is 32.    He enjoys making his bed and  watching movies. He has 700 movies.    Social Determinants of Health   Financial Resource Strain: Not on file  Food Insecurity: Not on file  Transportation Needs: Not on file  Physical Activity: Not on file  Stress: Not on file  Social Connections: Not on file  Intimate Partner Violence: Not on file   Past Surgical History:  Procedure Laterality Date   BRAIN SURGERY     CSF SHUNT     TYMPANOSTOMY TUBE PLACEMENT Bilateral    VENTRICULOPERITONEAL SHUNT     Family History  Problem Relation Age of Onset   Hypertension Mother    Renal Disease Father    Emphysema Maternal Grandfather        Died at 27    Current Outpatient Medications:    hydrOXYzine (VISTARIL) 50 MG capsule, Take 1 capsule (50 mg total) by mouth 3 (three) times daily as needed., Disp: 90 capsule, Rfl: 3   ipratropium-albuterol (DUONEB) 0.5-2.5 (3) MG/3ML SOLN, Take 3 mLs by nebulization every 6 (six) hours as needed (SOB, wheezing). Dx 493.90, Disp: 180 mL, Rfl: 2   ketoconazole (NIZORAL) 2 % cream, Apply 2 (two) times daily for 14 days. To the feet, Disp: 60 g, Rfl: 0   Melatonin 10 MG TABS, Take 10 mg by mouth at bedtime. , Disp: , Rfl:    metroNIDAZOLE (METROCREAM) 0.75 % cream, APPLY TO AFFECTED  AREAS TWICE A DAY, Disp: 45 g, Rfl: 3   paliperidone (INVEGA) 3 MG 24 hr tablet, Take 1 tablet (3 mg total) by mouth at bedtime., Disp: 90 tablet, Rfl: 3   traZODone (DESYREL) 100 MG tablet, TAKE 3 TABLETS AT BEDTIME, Disp: 90 tablet, Rfl: 3   Vitamin D, Ergocalciferol, (DRISDOL) 50000 units CAPS capsule, TAKE 1 CAPSULE ONCE A WEEK, Disp: 12 capsule, Rfl: 0  Allergies  Allergen Reactions   Cefaclor Anaphylaxis and Shortness Of Breath   Penicillins Anaphylaxis and Rash    Has patient had a PCN reaction causing immediate rash, facial/tongue/throat swelling, SOB or lightheadedness with hypotension: yes Has patient had a PCN reaction causing severe rash involving mucus membranes or skin necrosis: no Has patient had a PCN  reaction that required hospitalization: no Has patient had a PCN reaction occurring within the last 10 years: no If all of the above answers are "NO", then may proceed with Cephalosporin use.    Clindamycin Rash   Clindamycin/Lincomycin Rash     ROS: Review of Systems Pertinent items are noted in HPI.    Physical exam BP 120/71   Pulse 70   Temp 97.7 F (36.5 C)   Ht _0  (1.575 m)   Wt 273 lb 3.2 oz (123.9 kg)   SpO2 96%   BMI 49.97 kg/m  General appearance: alert, cooperative, appears stated age, and syndromic appearance - down syndrome Head: atraumatic Eyes: negative findings: lids and lashes normal, conjunctivae and sclerae normal, and corneas clear Ears: normal TM's and external ear canals both ears Nose: Nares normal. Septum midline. Mucosa normal. No drainage or sinus tenderness. Throat:  poor dentition.  Oropharynx without masses.  Moist mucous membranes Neck: no adenopathy, supple, symmetrical, trachea midline, and thyroid not enlarged, symmetric, no tenderness/mass/nodules Back: symmetric, no curvature. ROM normal. No CVA tenderness. Lungs: clear to auscultation bilaterally Chest wall: no tenderness Heart:  Distant heart sounds.  S1-S2 heard.  No murmurs appreciated Abdomen:  Obese.  Exam limited by habitus.  No appreciable masses palpated. Extremities: extremities normal, atraumatic, no cyanosis or edema Pulses: 2+ and symmetric Skin:  Minimal erythema noted underneath his pannus.  No skin breakdown Lymph nodes: Cervical, supraclavicular, and axillary nodes normal. Neurologic: Cognitive delay.  Follows commands been given by father   Assessment/ Plan: [name] here for annual physical exam.   Annual physical exam  Severe obesity (BMI >= 40) ([place]) - Plan: EKG 12-Lead, CMP14+EGFR, LDL Cholesterol, Direct, Bayer DCA Hb A1c Waived  OSA (obstructive sleep apnea) - Plan: EKG 12-Lead  Hypersomnia with sleep apnea  Schizophrenia, unspecified type  ([place]) - Plan: EKG 12-Lead, CMP14+EGFR, LDL Cholesterol, Direct, Bayer DCA Hb A1c Waived, CBC  Medication monitoring encounter - Plan: EKG 12-Lead, CMP14+EGFR, LDL Cholesterol, Direct, Bayer DCA Hb A1c Waived, CBC  Intertrigo - Plan: ketoconazole (NIZORAL) 2 % cream  Rosacea - Plan: metroNIDAZOLE (METROCREAM) 0.75 % cream  Vitamin D deficiency - Plan: VITAMIN D 25 Hydroxy (Vit-D Deficiency, Fractures)  Need for immunization against influenza - Plan: Flu Vaccine QUAD 40moIM (Fluarix, Fluzone & Alfiuria Quad PF)  Influenza vaccination administered.  He is up-to-date on preventative care.  EKG obtained given use of Invega and there were no evidence of QTC prolongation.  It was fairly unchanged from since 2018 EKG.  Continues to be morbidly obese with weight gain since our last visit.  Really emphasized need for smaller portions and less calorically dense foods.  This has been somewhat difficult given cognitive limitations  and refusal to eat vegetables.  Given nonfasting status, check direct LDL, A1c, CMP and CBC  Intertrigo looks fairly uncomplicated today.  No evidence of secondary bacterial infection.  Ketoconazole renewed as needed  MetroCream renewed for facial use if needed  Check vitamin D level.  We will renew vitamin D prescription pending this  Counseled on healthy lifestyle choices, including diet (rich in fruits, vegetables and lean meats and low in salt and simple carbohydrates) and exercise (at least 30 minutes of moderate physical activity daily).  Patient to follow up in 1 year for annual exam or sooner if needed.  Latoya Maulding M. Lajuana Ripple, DO

## 2021-03-05 NOTE — Addendum Note (Signed)
Addended by: Everlean Cherry on: 03/05/2021 04:17 PM   Modules accepted: Orders

## 2021-03-06 ENCOUNTER — Other Ambulatory Visit: Payer: Self-pay | Admitting: Family Medicine

## 2021-03-06 LAB — CBC
Hematocrit: 48.1 % (ref 37.5–51.0)
Hemoglobin: 16.1 g/dL (ref 13.0–17.7)
MCH: 29.9 pg (ref 26.6–33.0)
MCHC: 33.5 g/dL (ref 31.5–35.7)
MCV: 89 fL (ref 79–97)
Platelets: 227 10*3/uL (ref 150–450)
RBC: 5.38 x10E6/uL (ref 4.14–5.80)
RDW: 13.5 % (ref 11.6–15.4)
WBC: 6.1 10*3/uL (ref 3.4–10.8)

## 2021-03-06 LAB — CMP14+EGFR
ALT: 33 IU/L (ref 0–44)
AST: 24 IU/L (ref 0–40)
Albumin/Globulin Ratio: 1.3 (ref 1.2–2.2)
Albumin: 4.4 g/dL (ref 4.0–5.0)
Alkaline Phosphatase: 69 IU/L (ref 44–121)
BUN/Creatinine Ratio: 12 (ref 9–20)
BUN: 15 mg/dL (ref 6–20)
Bilirubin Total: 0.4 mg/dL (ref 0.0–1.2)
CO2: 23 mmol/L (ref 20–29)
Calcium: 9.2 mg/dL (ref 8.7–10.2)
Chloride: 99 mmol/L (ref 96–106)
Creatinine, Ser: 1.23 mg/dL (ref 0.76–1.27)
Globulin, Total: 3.4 g/dL (ref 1.5–4.5)
Glucose: 99 mg/dL (ref 70–99)
Potassium: 4.6 mmol/L (ref 3.5–5.2)
Sodium: 139 mmol/L (ref 134–144)
Total Protein: 7.8 g/dL (ref 6.0–8.5)
eGFR: 79 mL/min/{1.73_m2} (ref >59)

## 2021-03-06 LAB — VITAMIN D 25 HYDROXY (VIT D DEFICIENCY, FRACTURES): Vit D, 25-Hydroxy: 26 ng/mL — ABNORMAL LOW (ref 30.0–100.0)

## 2021-03-06 LAB — LDL CHOLESTEROL, DIRECT: LDL Direct: 110 mg/dL — ABNORMAL HIGH (ref 0–99)

## 2021-03-06 MED ORDER — VITAMIN D (ERGOCALCIFEROL) 1.25 MG (50000 UNIT) PO CAPS: 50000.0000 [IU] | ORAL_CAPSULE | ORAL | 3 refills | Status: AC

## 2021-03-28 ENCOUNTER — Telehealth: Payer: Self-pay | Admitting: Family Medicine

## 2021-04-03 ENCOUNTER — Other Ambulatory Visit (HOSPITAL_COMMUNITY): Payer: Self-pay | Admitting: Psychiatry

## 2021-04-03 DIAGNOSIS — F209 Schizophrenia, unspecified: Secondary | ICD-10-CM

## 2021-04-25 ENCOUNTER — Telehealth: Payer: Self-pay | Admitting: Family Medicine

## 2021-04-25 NOTE — Telephone Encounter (Signed)
Left message for patient to call back and schedule Medicare Annual Wellness Visit (AWV) to be completed by video or phone.   Last AWV: 07/04/2013  Please schedule at anytime with [place]  45 minute appointment  Any questions, please contact me at [phone]

## 2021-04-26 ENCOUNTER — Other Ambulatory Visit: Payer: Self-pay | Admitting: Family Medicine

## 2021-05-11 ENCOUNTER — Other Ambulatory Visit (HOSPITAL_COMMUNITY): Payer: Self-pay | Admitting: Psychiatry

## 2021-05-11 DIAGNOSIS — F5101 Primary insomnia: Secondary | ICD-10-CM

## 2021-05-17 ENCOUNTER — Other Ambulatory Visit: Payer: Self-pay

## 2021-05-17 ENCOUNTER — Ambulatory Visit (INDEPENDENT_AMBULATORY_CARE_PROVIDER_SITE_OTHER): Payer: Medicare Other

## 2021-05-17 VITALS — Ht 62.0 in | Wt 273.0 lb

## 2021-05-17 DIAGNOSIS — Z0000 Encounter for general adult medical examination without abnormal findings: Secondary | ICD-10-CM | POA: Diagnosis not present

## 2021-05-17 NOTE — Progress Notes (Signed)
Subjective:   Sergio [name] is a 36 y.o. male who presents for an Initial Medicare Annual Wellness Visit.  Virtual Visit via Telephone Note  I connected with  [name] on 05/17/21 at  2:45 PM EST by telephone and verified that I am speaking with the correct person using two identifiers.  Location: Patient: Home Provider: [facility] Persons participating in the virtual visit: patient/Nurse Health Advisor   I discussed the limitations, risks, security and privacy concerns of performing an evaluation and management service by telephone and the availability of in person appointments. The patient expressed understanding and agreed to proceed.  Interactive audio and video telecommunications were attempted between this nurse and patient, however failed, due to patient having technical difficulties OR patient did not have access to video capability.  We continued and completed visit with audio only.  Some vital signs may be absent or patient reported.   Sergio [name] E Cheryllynn Sarff, LPN   Review of Systems     Cardiac Risk Factors include: sedentary lifestyle;obesity (BMI >30kg/m2);male gender     Objective:    Today's Vitals   05/17/21 1429  Weight: 273 lb (123.8 kg)  Height: _0  (1.575 m)   Body mass index is 49.93 kg/m.  Advanced Directives 05/17/2021 05/22/2017 07/02/2012 07/06/2011  Does Patient Have a Medical Advance Directive? Unable to assess, patient is non-responsive or altered mental status No Patient does not have advance directive;Patient would not like information Patient does not have advance directive  Pre-existing out of facility DNR order (yellow form or pink MOST form) - - - No    Current Medications (verified) Outpatient Encounter Medications as of 05/17/2021  Medication Sig   hydrOXYzine (VISTARIL) 50 MG capsule Take 1 capsule (50 mg total) by mouth 3 (three) times daily as needed.   ipratropium-albuterol (DUONEB) 0.5-2.5 (3) MG/3ML SOLN NEBULIZE 1 VIAL EVERY 6  HOURS AS NEEDED FOR WHEEZING OR SHORTNESS OF BREATH   ketoconazole (NIZORAL) 2 % cream Apply topically daily as needed for irritation (rash in groin). X14 days per flare until cleared   Melatonin 10 MG TABS Take 10 mg by mouth at bedtime.    metroNIDAZOLE (METROCREAM) 0.75 % cream APPLY TO AFFECTED AREAS TWICE A DAY for rosacea of face   paliperidone (INVEGA) 3 MG 24 hr tablet Take 1 tablet (3 mg total) by mouth at bedtime.   traZODone (DESYREL) 100 MG tablet TAKE 3 TABLETS AT BEDTIME   Vitamin D, Ergocalciferol, (DRISDOL) 1.25 MG (50000 UNIT) CAPS capsule Take 1 capsule (50,000 Units total) by mouth every 7 (seven) days.   No facility-administered encounter medications on file as of 05/17/2021.    Allergies (verified) Cefaclor, Penicillins, Clindamycin, and Clindamycin/lincomycin   History: Past Medical History:  Diagnosis Date   Dementia ([place])    Difficulty swallowing    Down's syndrome    Dysphagia causing pulmonary aspiration with swallowing    GERD (gastroesophageal reflux disease)    Headache(784.0)    Hydrocephalus (HCC)    Insomnia    Memory loss    OSA (obstructive sleep apnea)    Pituitary adenoma ([place])    Schizoaffective disorder    Weakness    Past Surgical History:  Procedure Laterality Date   BRAIN SURGERY     CSF SHUNT     TYMPANOSTOMY TUBE PLACEMENT Bilateral    VENTRICULOPERITONEAL SHUNT     Family History  Problem Relation Age of Onset   Hypertension Mother    Renal Disease Father  Emphysema Maternal Grandfather        Died at 9   Social History   Socioeconomic History   Marital status: Single    Spouse name: Not on file   Number of children: Not on file   Years of education: Not on file   Highest education level: Not on file  Occupational History   Occupation: disabled  Tobacco Use   Smoking status: Never   Smokeless tobacco: Never  Vaping Use   Vaping Use: Never used  Substance and Sexual Activity   Alcohol use: No   Drug use: No    Sexual activity: Never  Other Topics Concern   Not on file  Social History Narrative   Sergio [name] is a 36 year old male.   He lives with both of his parents Sergio [name] and Sergio [name]). He has 1 sister,who is 32.    He enjoys making his bed and watching movies. He has 700 movies.    Down's syndrome and schizophrenia - very dependent on his mother, Sergio [name]   Social Determinants of Health   Financial Resource Strain: Medium Risk   Difficulty of Paying Living Expenses: Somewhat hard  Food Insecurity: Landscape architect Present   Worried About Charity fundraiser in the Last Year: Sometimes true   Arboriculturist in the Last Year: Never true  Transportation Needs: No Transportation Needs   Lack of Transportation (Medical): No   Lack of Transportation (Non-Medical): No  Physical Activity: Inactive   Days of Exercise per Week: 0 days   Minutes of Exercise per Session: 0 min  Stress: Stress Concern Present   Feeling of Stress : Rather much  Social Connections: Moderately Integrated   Frequency of Communication with Friends and Family: Never   Frequency of Social Gatherings with Friends and Family: More than three times a week   Attends Religious Services: 1 to 4 times per year   Active Member of [employer] or Organizations: Yes   Attends Archivist Meetings: 1 to 4 times per year   Marital Status: Never married    Tobacco Counseling Counseling given: Not Answered   Clinical Intake:  Pre-visit preparation completed: Yes  Pain : No/denies pain     BMI - recorded: 49.93 Nutritional Status: BMI > 30  Obese Nutritional Risks: None Diabetes: No     Diabetic? no         Activities of Daily Living In your present state of health, do you have any difficulty performing the following activities: 05/17/2021  Hearing? N  Vision? N  Difficulty concentrating or making decisions? Y  Walking or climbing stairs? Y  Dressing or bathing? Y  Doing errands, shopping? Y  Preparing Food and eating  ? Y  Using the Toilet? Y  In the past six months, have you accidently leaked urine? N  Do you have problems with loss of bowel control? N  Managing your Medications? Y  Managing your Finances? Y  Housekeeping or managing your Housekeeping? Y  Some recent data might be hidden    Patient Care Team: Janora Norlander, DO as PCP - General (Family Medicine) Hampton Abbot, MD as Consulting Physician (Psychiatry)  Indicate any recent Medical Services you may have received from other than [facility] providers in the past year (date may be approximate).     Assessment:   This is a routine wellness examination for [place].  Hearing/Vision screen Hearing Screening - Comments:: No hearing concerns Vision Screening - Comments:: No vision concerns -  no eye doctor at this time  Dietary issues and exercise activities discussed: Current Exercise Habits: The patient does not participate in regular exercise at present, Exercise limited by: psychological condition(s);respiratory conditions(s)   Goals Addressed             This Visit's Progress    Exercise 150 min/wk Moderate Activity         Depression Screen PHQ 2/9 Scores 05/17/2021 03/05/2021 11/09/2018 12/30/2017 11/11/2017 06/30/2017 02/26/2017  PHQ - 2 Score 0 0 - 0 0 0 0  PHQ- 9 Score - 5 - - - - -  Exception Documentation - - Medical reason - - - -    Fall Risk Fall Risk  05/17/2021 03/05/2021 12/30/2017 11/11/2017 02/26/2017  Falls in the past year? 1 1 No No No  Number falls in past yr: 1 0 - - -  Injury with Fall? 0 0 - - -  Risk for fall due to : Impaired balance/gait;History of fall(s) History of fall(s) - - -  Follow up Education provided;Falls prevention discussed Education provided - - -    FALL RISK PREVENTION PERTAINING TO THE HOME:  Any stairs in or around the home?  Recently had a ramp placed If so, are there any without handrails? No  Home free of loose throw rugs in walkways, pet beds, electrical cords, etc? Yes  Adequate  lighting in your home to reduce risk of falls? Yes   ASSISTIVE DEVICES UTILIZED TO PREVENT FALLS:  Life alert? No  Use of a cane, walker or w/c? No  Grab bars in the bathroom? Yes  Shower chair or bench in shower? Yes  Elevated toilet seat or a handicapped toilet? Yes   TIMED UP AND GO:  Was the test performed? No . Telephonic visit  Cognitive Function: Cognitive status assessed by direct observation. Patient has current diagnosis of cognitive impairment/ Down's Syndrome. Patient is unable to complete screening 6CIT or MMSE.    MMSE - Mini Mental State Exam 05/17/2021  Not completed: Unable to complete        Immunizations Immunization History  Administered Date(s) Administered   DTaP 09/17/1988, 10/27/1990, 11/18/1991   Hepatitis B 01/24/2000, 03/03/2000, 07/21/2000   HiB (PRP-OMP) 09/17/1988   IPV 09/17/1988, 10/27/1990, 11/18/1991   Influenza Inj Mdck Quad Pf 07/04/2018   Influenza,inj,Quad PF,6+ Mos 05/11/2013, 03/24/2014, 04/08/2017, 03/05/2021   Influenza-Unspecified 03/24/2014, 02/03/2015, 04/22/2017   MMR 11/18/1991   Moderna Sars-Covid-2 Vaccination 04/25/2020   PPD Test 01/31/2014    TDAP status: Due, Education has been provided regarding the importance of this vaccine. Advised may receive this vaccine at local pharmacy or Health Dept. Aware to provide a copy of the vaccination record if obtained from local pharmacy or Health Dept. Verbalized acceptance and understanding.  Flu Vaccine status: Up to date  Covid-19 vaccine status: Completed vaccines  Qualifies for Shingles Vaccine? No   Zostavax completed No    Screening Tests Health Maintenance  Topic Date Due   COVID-19 Vaccine (2 - Moderna series) 05/23/2020   Pneumococcal Vaccine 67-30 Years old (1 - PCV) 03/05/2022 (Originally 03/07/1992)   TETANUS/TDAP  03/05/2022 (Originally 03/07/2005)   Hepatitis C Screening  03/05/2022 (Originally 03/07/2004)   INFLUENZA VACCINE  Completed   HIV Screening  Completed    HPV VACCINES  Aged Out    Health Maintenance  Health Maintenance Due  Topic Date Due   COVID-19 Vaccine (2 - Moderna series) 05/23/2020    Lung Cancer Screening: (Low Dose CT Chest recommended if  Age 47-80 years, 30 pack-year currently smoking OR have quit w/in 15years.) does not qualify  Additional Screening:  Hepatitis C Screening: does not qualify  Vision Screening: Recommended annual ophthalmology exams for early detection of glaucoma and other disorders of the eye. Is the patient up to date with their annual eye exam?  No  Who is the provider or what is the name of the office in which the patient attends annual eye exams? none If pt is not established with a provider, would they like to be referred to a provider to establish care? No .   Dental Screening: Recommended annual dental exams for proper oral hygiene  Community Resource Referral / Chronic Care Management: CRR required this visit?  No   CCM required this visit?  No      Plan:     I have personally reviewed and noted the following in the patients chart:   Medical and social history Use of alcohol, tobacco or illicit drugs  Current medications and supplements including opioid prescriptions. Patient is not currently taking opioid prescriptions. Functional ability and status Nutritional status Physical activity Advanced directives List of other physicians Hospitalizations, surgeries, and ER visits in previous 12 months Vitals Screenings to include cognitive, depression, and falls Referrals and appointments  In addition, I have reviewed and discussed with patient certain preventive protocols, quality metrics, and best practice recommendations. A written personalized care plan for preventive services as well as general preventive health recommendations were provided to patient.     Sandrea Hammond, LPN   [id number]   Nurse Notes: None

## 2021-05-17 NOTE — Telephone Encounter (Signed)
Mom says she has been using this on him for years, but recently ran out (it lasts a very long time) and needs a refill - tried otc treatments but they don't work as well for the dry patches on his scalp

## 2021-05-17 NOTE — Patient Instructions (Signed)
Sergio [name] , Thank you for taking time to come for your Medicare Wellness Visit. I appreciate your ongoing commitment to your health goals. Please review the following plan we discussed and let me know if I can assist you in the future.   Screening recommendations/referrals: Colonoscopy: due at age 36 Recommended yearly ophthalmology/optometry visit for glaucoma screening and checkup Recommended yearly dental visit for hygiene and checkup  Vaccinations: Influenza vaccine: Done 03/05/2021 - Repeat annually Pneumococcal vaccine: Due after age 75 Tdap vaccine: Due (every 10 years) Shingles vaccine: Due after age 75   Covid-19: 63 - we need dates please  Advanced directives: Please bring a copy of your health care power of attorney and living will to the office to be added to your chart at your convenience.   Conditions/risks identified: Aim for 30 minutes of exercise or brisk walking each day, drink 6-8 glasses of water and eat lots of fruits and vegetables.   Next appointment: Follow up in one year for your annual wellness visit   Preventive Care 30-64 Years, Male Preventive care refers to lifestyle choices and visits with your health care provider that can promote health and wellness. What does preventive care include? A yearly physical exam. This is also called an annual well check. Dental exams once or twice a year. Routine eye exams. Ask your health care provider how often you should have your eyes checked. Personal lifestyle choices, including: Daily care of your teeth and gums. Regular physical activity. Eating a healthy diet. Avoiding tobacco and drug use. Limiting alcohol use. Practicing safe sex. Taking low-dose aspirin every day starting at age 85. What happens during an annual well check? The services and screenings done by your health care provider during your annual well check will depend on your age, overall health, lifestyle risk factors, and family history of  disease. Counseling  Your health care provider may ask you questions about your: Alcohol use. Tobacco use. Drug use. Emotional well-being. Home and relationship well-being. Sexual activity. Eating habits. Work and work Statistician. Screening  You may have the following tests or measurements: Height, weight, and BMI. Blood pressure. Lipid and cholesterol levels. These may be checked every 5 years, or more frequently if you are over 35 years old. Skin check. Lung cancer screening. You may have this screening every year starting at age 23 if you have a 30-pack-year history of smoking and currently smoke or have quit within the past 15 years. Fecal occult blood test (FOBT) of the stool. You may have this test every year starting at age 44. Flexible sigmoidoscopy or colonoscopy. You may have a sigmoidoscopy every 5 years or a colonoscopy every 10 years starting at age 83. Prostate cancer screening. Recommendations will vary depending on your family history and other risks. Hepatitis C blood test. Hepatitis B blood test. Sexually transmitted disease (STD) testing. Diabetes screening. This is done by checking your blood sugar (glucose) after you have not eaten for a while (fasting). You may have this done every 1-3 years. Discuss your test results, treatment options, and if necessary, the need for more tests with your health care provider. Vaccines  Your health care provider may recommend certain vaccines, such as: Influenza vaccine. This is recommended every year. Tetanus, diphtheria, and acellular pertussis (Tdap, Td) vaccine. You may need a Td booster every 10 years. Zoster vaccine. You may need this after age 86. Pneumococcal 13-valent conjugate (PCV13) vaccine. You may need this if you have certain conditions and have not been vaccinated.  Pneumococcal polysaccharide (PPSV23) vaccine. You may need one or two doses if you smoke cigarettes or if you have certain conditions. Talk to your  health care provider about which screenings and vaccines you need and how often you need them. This information is not intended to replace advice given to you by your health care provider. Make sure you discuss any questions you have with your health care provider. Document Released: 05/19/2015 Document Revised: 01/10/2016 Document Reviewed: 02/21/2015 Elsevier Interactive Patient Education  2017 [place] Prevention in the Home Falls can cause injuries. They can happen to people of all ages. There are many things you can do to make your home safe and to help prevent falls. What can I do on the outside of my home? Regularly fix the edges of walkways and driveways and fix any cracks. Remove anything that might make you trip as you walk through a door, such as a raised step or threshold. Trim any bushes or trees on the path to your home. Use bright outdoor lighting. Clear any walking paths of anything that might make someone trip, such as rocks or tools. Regularly check to see if handrails are loose or broken. Make sure that both sides of any steps have handrails. Any raised decks and porches should have guardrails on the edges. Have any leaves, snow, or ice cleared regularly. Use sand or salt on walking paths during winter. Clean up any spills in your garage right away. This includes oil or grease spills. What can I do in the bathroom? Use night lights. Install grab bars by the toilet and in the tub and shower. Do not use towel bars as grab bars. Use non-skid mats or decals in the tub or shower. If you need to sit down in the shower, use a plastic, non-slip stool. Keep the floor dry. Clean up any water that spills on the floor as soon as it happens. Remove soap buildup in the tub or shower regularly. Attach bath mats securely with double-sided non-slip rug tape. Do not have throw rugs and other things on the floor that can make you trip. What can I do in the bedroom? Use night  lights. Make sure that you have a light by your bed that is easy to reach. Do not use any sheets or blankets that are too big for your bed. They should not hang down onto the floor. Have a firm chair that has side arms. You can use this for support while you get dressed. Do not have throw rugs and other things on the floor that can make you trip. What can I do in the kitchen? Clean up any spills right away. Avoid walking on wet floors. Keep items that you use a lot in easy-to-reach places. If you need to reach something above you, use a strong step stool that has a grab bar. Keep electrical cords out of the way. Do not use floor polish or wax that makes floors slippery. If you must use wax, use non-skid floor wax. Do not have throw rugs and other things on the floor that can make you trip. What can I do with my stairs? Do not leave any items on the stairs. Make sure that there are handrails on both sides of the stairs and use them. Fix handrails that are broken or loose. Make sure that handrails are as long as the stairways. Check any carpeting to make sure that it is firmly attached to the stairs. Fix any carpet that  is loose or worn. Avoid having throw rugs at the top or bottom of the stairs. If you do have throw rugs, attach them to the floor with carpet tape. Make sure that you have a light switch at the top of the stairs and the bottom of the stairs. If you do not have them, ask someone to add them for you. What else can I do to help prevent falls? Wear shoes that: Do not have high heels. Have rubber bottoms. Are comfortable and fit you well. Are closed at the toe. Do not wear sandals. If you use a stepladder: Make sure that it is fully opened. Do not climb a closed stepladder. Make sure that both sides of the stepladder are locked into place. Ask someone to hold it for you, if possible. Clearly mark and make sure that you can see: Any grab bars or handrails. First and last  steps. Where the edge of each step is. Use tools that help you move around (mobility aids) if they are needed. These include: Canes. Walkers. Scooters. Crutches. Turn on the lights when you go into a dark area. Replace any light bulbs as soon as they burn out. Set up your furniture so you have a clear path. Avoid moving your furniture around. If any of your floors are uneven, fix them. If there are any pets around you, be aware of where they are. Review your medicines with your doctor. Some medicines can make you feel dizzy. This can increase your chance of falling. Ask your doctor what other things that you can do to help prevent falls. This information is not intended to replace advice given to you by your health care provider. Make sure you discuss any questions you have with your health care provider. Document Released: 02/16/2009 Document Revised: 09/28/2015 Document Reviewed: 05/27/2014 Elsevier Interactive Patient Education  2017 [employer].

## 2021-05-18 MED ORDER — KETOCONAZOLE 2 % EX SHAM: 1.0000 "application " | MEDICATED_SHAMPOO | CUTANEOUS | 3 refills | Status: DC

## 2021-05-18 NOTE — Addendum Note (Signed)
Addended by: Antonietta Barcelona D on: 05/18/2021 03:48 PM   Modules accepted: Orders

## 2021-05-18 NOTE — Telephone Encounter (Signed)
Rx failed. resent 

## 2021-06-19 ENCOUNTER — Telehealth (HOSPITAL_COMMUNITY): Payer: Self-pay | Admitting: *Deleted

## 2021-06-19 NOTE — Telephone Encounter (Signed)
Pt's mother, [name], called requesting refills on all meds. Nurse advised that pt needs an appointment on the books. Front desk to call and schedule. Please review and advise.

## 2021-07-03 ENCOUNTER — Telehealth (HOSPITAL_COMMUNITY): Payer: Self-pay | Admitting: Psychiatry

## 2021-07-03 DIAGNOSIS — F209 Schizophrenia, unspecified: Secondary | ICD-10-CM

## 2021-07-03 MED ORDER — HYDROXYZINE PAMOATE 50 MG PO CAPS: 50.0000 mg | ORAL_CAPSULE | Freq: Three times a day (TID) | ORAL | 0 refills | Status: DC | PRN

## 2021-07-03 MED ORDER — PALIPERIDONE ER 3 MG PO TB24: 3.0000 mg | ORAL_TABLET | Freq: Every day | ORAL | 0 refills | Status: DC

## 2021-07-03 NOTE — Telephone Encounter (Signed)
Dr. Dwyane Dee wants pt to see provider at [facility] on the 2nd I believe. Butch Penny communicated this to pt mother.

## 2021-07-03 NOTE — Telephone Encounter (Signed)
Patient of Dr Dwyane Dee. Needs refill. Has not seen since April 2021.  Thirty days bridge supply given. Patient must established care with new provider.

## 2021-07-04 NOTE — Telephone Encounter (Signed)
Yes I check. He has appointment with Resident on March 30 th.  ?

## 2021-07-30 ENCOUNTER — Other Ambulatory Visit: Payer: Self-pay | Admitting: Family Medicine

## 2021-07-30 DIAGNOSIS — K047 Periapical abscess without sinus: Secondary | ICD-10-CM | POA: Diagnosis not present

## 2021-07-30 DIAGNOSIS — L719 Rosacea, unspecified: Secondary | ICD-10-CM

## 2021-07-30 DIAGNOSIS — H10021 Other mucopurulent conjunctivitis, right eye: Secondary | ICD-10-CM | POA: Diagnosis not present

## 2021-08-02 ENCOUNTER — Ambulatory Visit (HOSPITAL_COMMUNITY): Payer: Medicare Other | Admitting: Student in an Organized Health Care Education/Training Program

## 2021-08-21 ENCOUNTER — Other Ambulatory Visit (HOSPITAL_COMMUNITY): Payer: Self-pay | Admitting: Psychiatry

## 2021-08-21 DIAGNOSIS — F209 Schizophrenia, unspecified: Secondary | ICD-10-CM

## 2021-08-23 ENCOUNTER — Other Ambulatory Visit (HOSPITAL_COMMUNITY): Payer: Self-pay | Admitting: Psychiatry

## 2021-08-23 DIAGNOSIS — F209 Schizophrenia, unspecified: Secondary | ICD-10-CM

## 2021-09-17 ENCOUNTER — Other Ambulatory Visit: Payer: Self-pay | Admitting: Family Medicine

## 2021-09-25 ENCOUNTER — Telehealth (HOSPITAL_COMMUNITY): Payer: Self-pay | Admitting: Psychiatry

## 2021-10-02 ENCOUNTER — Ambulatory Visit (HOSPITAL_COMMUNITY): Payer: Medicare Other | Admitting: Psychiatry

## 2021-11-01 ENCOUNTER — Other Ambulatory Visit (HOSPITAL_COMMUNITY): Payer: Self-pay | Admitting: Psychiatry

## 2021-11-01 DIAGNOSIS — F209 Schizophrenia, unspecified: Secondary | ICD-10-CM

## 2021-11-08 ENCOUNTER — Ambulatory Visit (HOSPITAL_COMMUNITY): Payer: Medicare Other | Admitting: Psychiatry

## 2021-11-14 ENCOUNTER — Telehealth (HOSPITAL_COMMUNITY): Payer: Self-pay | Admitting: Psychiatry

## 2021-11-14 NOTE — Telephone Encounter (Signed)
Clld pt to set an appt with Dr. Fatima Sanger for this coming Friday morning 11/16/21 there was no ans. LVM Sergio [name]

## 2021-12-03 ENCOUNTER — Ambulatory Visit (HOSPITAL_BASED_OUTPATIENT_CLINIC_OR_DEPARTMENT_OTHER): Payer: Medicare Other | Admitting: Student in an Organized Health Care Education/Training Program

## 2021-12-03 ENCOUNTER — Encounter (HOSPITAL_COMMUNITY): Payer: Self-pay | Admitting: Student in an Organized Health Care Education/Training Program

## 2021-12-03 DIAGNOSIS — F5101 Primary insomnia: Secondary | ICD-10-CM

## 2021-12-03 DIAGNOSIS — G4700 Insomnia, unspecified: Secondary | ICD-10-CM

## 2021-12-03 DIAGNOSIS — F209 Schizophrenia, unspecified: Secondary | ICD-10-CM

## 2021-12-03 MED ORDER — TRAZODONE HCL 100 MG PO TABS: 200.0000 mg | ORAL_TABLET | Freq: Every day | ORAL | 3 refills | Status: DC

## 2021-12-03 MED ORDER — PALIPERIDONE ER 3 MG PO TB24: 3.0000 mg | ORAL_TABLET | Freq: Every day | ORAL | 0 refills | Status: DC

## 2021-12-03 MED ORDER — HYDROXYZINE PAMOATE 50 MG PO CAPS: 50.0000 mg | ORAL_CAPSULE | Freq: Three times a day (TID) | ORAL | 0 refills | Status: DC | PRN

## 2021-12-03 NOTE — Progress Notes (Addendum)
Psychiatric Initial Adult Assessment   Patient Identification: Sergio [name] MRN:  [id number] Date of Evaluation:  12/03/2021 Referral Source:  Chief Complaint:  No chief complaint on file.  Visit Diagnosis:    ICD-10-CM   1. Schizophrenia, unspecified type ([place])  F20.9 paliperidone (INVEGA) 3 MG 24 hr tablet    hydrOXYzine (VISTARIL) 50 MG capsule    2. Insomnia, unspecified type  G47.00     3. Primary insomnia  F51.01 traZODone (DESYREL) 100 MG tablet     I connected with  Sergio [name] on 12/13/21 by a telephone telemedicine application and verified that I am speaking with the correct person using two identifiers.   I discussed the limitations of evaluation and management by telemedicine. The patient expressed understanding and agreed to proceed. Patient was at home, provider at [facility] outpatient History of Present Illness:   Sergio [name] is a 36 yr old male who presents via telephone to re-establish care and restart medication management.  PPHx is significant for Paranoid Schizophrenia, Down's Syndrome with Severe Impairment, and has Legal Guardians (parents Sergio [name] and Sergio [name]), no Suicide Attempts, 1 episode of attempting to cut Shunt out of head due to command hallucinations, and no hospitalizations.    Patient's mother/Legal Guardian, Sergio [name] [phone], and she placed the phone on speaker and [name] was in the room.  He was minimally responsive during the entire interview only saying hello/bye and answering a few questions- yes/no.  Sergio [name] provided the following information.  Patient is diagnosed with Down syndrome and is lower functioning.  He does have a history of hydrocephalus and has had shunts placed.  She reports he was born 54 weeks premature.  She reports that patient began having psychiatric issues at age 44.  She reports that he began having paranoia and delusions at that time.  She report he will talk to 3 "weirdos"  who have told him to hurt himself in the past.  She reports that when he has his Lorayne Bender the delusions are still present but much improved (estimates 90% better and able to mostly tune them out).  She reports that he has not needed the hydroxyzine but that she would like to continue with it as needed.  Past psychiatric history is significant for Paranoid Schizophrenia.  Down's Syndrome - low functioning.  He has Interior and spatial designer (parents Sergio [name] and Sergio [name]).  There are no Suicide Attempts.  He has 1 episode of attempting to cut the Shunt out of head due to command hallucinations. No history of hospitalizations.  PMH is significant for hydrocephalus.  PSH significant for shunt placement, tear duct opening, T tube placement, Molar removal.  No history of head trauma or seizures.  He has allergies to Cefaclor, Penicillin, and Clindamycin.  He lives with his parents who are his legal guardians.  He does not work.  He does not use tobacco, EtOH, or Illicit substances.  Has good relationship with his family enjoying when his sister and her children visit.  He loves watching movies.  He graduated HS with an IEP.  He has no current legal issues.  He has no access to firearms.    Discussed restarting his Lorayne Bender and she was agreeable to this. Discussed continuing Trazodone and Hydroxyzine and she was agreeable.  She reports he does not have any SI or HI.  He is responding to internal stimuli.  Discussed with them what to do in the event of a future crisis.  Discussed  that they can come to the office, go to [facility], go to the nearest ED, or call 911 or [phone].   She reported understanding and had no concerns.    Associated Signs/Symptoms: Depression Symptoms:   Reports None (Hypo) Manic Symptoms:   Reports None Anxiety Symptoms:   Reports None Psychotic Symptoms:  Delusions, Hallucinations: Auditory Visual Paranoia, PTSD Symptoms: NA  Past Psychiatric History: Paranoid Schizophrenia, Down's Syndrome with Severe  Impairment, and has Legal Guardians (parents Aden and Sergio [name]), no Suicide Attempts, 1 episode of attempting to cut Shunt out of head due to command hallucinations, and no hospitalizations.   Previous Psychotropic Medications: Yes   Substance Abuse History in the last 12 months:  No.  Consequences of Substance Abuse: NA  Past Medical History:  Past Medical History:  Diagnosis Date   Dementia ([place])    Difficulty swallowing    Down's syndrome    Dysphagia causing pulmonary aspiration with swallowing    GERD (gastroesophageal reflux disease)    Headache(784.0)    Hydrocephalus (HCC)    Insomnia    Memory loss    OSA (obstructive sleep apnea)    Pituitary adenoma ([place])    Schizoaffective disorder    Weakness     Past Surgical History:  Procedure Laterality Date   BRAIN SURGERY     CSF SHUNT     TYMPANOSTOMY TUBE PLACEMENT Bilateral    VENTRICULOPERITONEAL SHUNT      Family Psychiatric History: No Known Diagnosis', Substance Use, or Suicides.  Family History:  Family History  Problem Relation Age of Onset   Hypertension Mother    Renal Disease Father    Emphysema Maternal Grandfather        Died at 75    Social History:   Social History   Socioeconomic History   Marital status: Single    Spouse name: Not on file   Number of children: Not on file   Years of education: Not on file   Highest education level: Not on file  Occupational History   Occupation: disabled  Tobacco Use   Smoking status: Never   Smokeless tobacco: Never  Vaping Use   Vaping Use: Never used  Substance and Sexual Activity   Alcohol use: No   Drug use: No   Sexual activity: Never  Other Topics Concern   Not on file  Social History Narrative   Sergio [name] is a 36 year old male.   He lives with both of his parents Sergio [name] and Sergio [name]). He has 1 sister,who is 32.    He enjoys making his bed and watching movies. He has 700 movies.    Down's syndrome and schizophrenia - very dependent on his  mother, Sergio [name]   Social Determinants of Health   Financial Resource Strain: Medium Risk (05/17/2021)   Overall Financial Resource Strain (CARDIA)    Difficulty of Paying Living Expenses: Somewhat hard  Food Insecurity: Food Insecurity Present (05/17/2021)   Hunger Vital Sign    Worried About Running Out of Food in the Last Year: Sometimes true    Ran Out of Food in the Last Year: Never true  Transportation Needs: No Transportation Needs (05/17/2021)   PRAPARE - Hydrologist (Medical): No    Lack of Transportation (Non-Medical): No  Physical Activity: Inactive (05/17/2021)   Exercise Vital Sign    Days of Exercise per Week: 0 days    Minutes of Exercise per Session: 0 min  Stress: Stress Concern Present (  05/17/2021)   [place]    Feeling of Stress : Rather much  Social Connections: Moderately Integrated (05/17/2021)   Social Connection and Isolation Panel [NHANES]    Frequency of Communication with Friends and Family: Never    Frequency of Social Gatherings with Friends and Family: More than three times a week    Attends Religious Services: 1 to 4 times per year    Active Member of [employer] or Organizations: Yes    Attends Archivist Meetings: 1 to 4 times per year    Marital Status: Never married    Additional Social History: None  Allergies:   Allergies  Allergen Reactions   Cefaclor Anaphylaxis and Shortness Of Breath   Penicillins Anaphylaxis and Rash    Has patient had a PCN reaction causing immediate rash, facial/tongue/throat swelling, SOB or lightheadedness with hypotension: yes Has patient had a PCN reaction causing severe rash involving mucus membranes or skin necrosis: no Has patient had a PCN reaction that required hospitalization: no Has patient had a PCN reaction occurring within the last 10 years: no If all of the above answers are "NO", then may proceed with  Cephalosporin use.    Clindamycin Rash   Clindamycin/Lincomycin Rash    Metabolic Disorder Labs: Lab Results  Component Value Date   HGBA1C 5.5 03/05/2021   Lab Results  Component Value Date   PROLACTIN 51.5 (H) 01/21/2013   No results found for: "CHOL", "TRIG", "HDL", "CHOLHDL", "VLDL", "[place]" Lab Results  Component Value Date   TSH 3.020 01/21/2013    Therapeutic Level Labs: No results found for: "LITHIUM" No results found for: "CBMZ" No results found for: "VALPROATE"  Current Medications: Current Outpatient Medications  Medication Sig Dispense Refill   hydrOXYzine (VISTARIL) 50 MG capsule Take 1 capsule (50 mg total) by mouth 3 (three) times daily as needed. 90 capsule 0   ipratropium-albuterol (DUONEB) 0.5-2.5 (3) MG/3ML SOLN NEBULIZE 1 VIAL EVERY 6 HOURS AS NEEDED FOR WHEEZING OR SHORTNESS OF BREATH 180 mL 2   ketoconazole (NIZORAL) 2 % cream Apply topically daily as needed for irritation (rash in groin). X14 days per flare until cleared 120 g 3   ketoconazole (NIZORAL) 2 % shampoo APPLY TOPICALLY 2 TIMES A WEEK 120 mL 3   Melatonin 10 MG TABS Take 10 mg by mouth at bedtime.      metroNIDAZOLE (METROCREAM) 0.75 % cream APPLY TO THE AFFECTED AREA(S) TWICE DAILY FOR ROSACEA OF FACE 45 g 3   paliperidone (INVEGA) 3 MG 24 hr tablet Take 1 tablet (3 mg total) by mouth at bedtime. 30 tablet 0   traZODone (DESYREL) 100 MG tablet TAKE 3 TABLETS AT BEDTIME 90 tablet 3   Vitamin D, Ergocalciferol, (DRISDOL) 1.25 MG (50000 UNIT) CAPS capsule Take 1 capsule (50,000 Units total) by mouth every 7 (seven) days. 12 capsule 3   No current facility-administered medications for this visit.    Musculoskeletal: Strength & Muscle Tone:  Could not assess Gait & Station:  Could not assess Patient leans: N/A  Psychiatric Specialty Exam: Review of Systems  Respiratory:  Negative for shortness of breath.   Cardiovascular:  Negative for chest pain.  Gastrointestinal:  Negative for  abdominal pain, constipation, diarrhea, rectal pain and vomiting.  Neurological:  Negative for dizziness, weakness and headaches.  Psychiatric/Behavioral:  Positive for sleep disturbance. Negative for self-injury and suicidal ideas. The patient is not nervous/anxious.     There were no vitals taken  for this visit.There is no height or weight on file to calculate BMI.  General Appearance: Could not assess  Eye Contact:  Could not assess  Speech:   minimal  Volume:  Decreased  Mood:   mother reports "fine"  Affect:   minimally interactive  Thought Process:  minimal, concrete  Orientation:  Could not assess due to minimal answers  Thought Content:  Hallucinations: Auditory Visual and Paranoid Ideation  Suicidal Thoughts:  No  Homicidal Thoughts:  No  Memory:  Immediate;   Poor  Judgement:  Impaired  Insight:  Lacking  Psychomotor Activity:  Could not assess  Concentration:  Concentration: Poor and Attention Span: Poor  Recall:  Poor  Fund of Knowledge:Poor  Language: Poor minimal  Akathisia:  Could not assess  Handed:  Right  AIMS (if indicated):  not done, Could not assess  Assets:  Financial Resources/Insurance Housing Social Support  ADL's:  Impaired  Cognition: Impaired,  Severe  Sleep:  Poor   Screenings: GAD-7    Flowsheet Row Office Visit from 03/05/2021 in [place]  Total GAD-7 Score 0      PHQ2-9    Flowsheet Row Clinical Support from 05/17/2021 in [place] Visit from 03/05/2021 in [place] Office Visit from 12/30/2017 in [place] Office Visit from 11/11/2017 in [place] Office Visit from 06/30/2017 in [place]  PHQ-2 Total Score 0 0 0 0 0  PHQ-9 Total Score -- 5 -- -- --       Assessment and Plan:  Shameek "[name]" Arico [name] is a 36 yr old male who presents via telephone to re-establish care and  restart medication management.  PPHx is significant for Paranoid Schizophrenia, Down's Syndrome with Severe Impairment, and has Legal Guardians (parents Sergio [name] and Sergio [name]), no Suicide Attempts, 1 episode of attempting to cut Shunt out of head due to command hallucinations, and no hospitalizations.    "[name]" has been having worsening hallucinations since running out of his Invega 3 weeks ago.  His mother reports that he has been talking to the 3 "weirdos" more but has not become aggressive.  We will restart his Invega and continue his Trazodone.  We will continue his PRN Hydroxyzine so that if he needs an acute medication his parents have it on hand.  This appointment was virtual today because without his Lorayne Bender it is significantly difficult for his family to get him to appointments, hopefully, once restarted on his medications he will be able to come into the office.  He will have a follow up in 4 weeks.   Paranoid Schizophrenia: -Restart Invega 3 mg QHS.  30 tablets with 0 refills. -Continue Trazodone 200 mg QHS. 90 tablets with 3 refills. -Continue Hydroxyzine 50 mg PRN TID.  90 tablets with 0 refills.   Collaboration of Care:   Patient/Guardian was advised Release of Information must be obtained prior to any record release in order to collaborate their care with an outside provider. Patient/Guardian was advised if they have not already done so to contact the registration department to sign all necessary forms in order for [place] to release information regarding their care.   Consent: Patient/Guardian gives verbal consent for treatment and assignment of benefits for services provided during this visit. Patient/Guardian expressed understanding and agreed. This was a 30 minute telephone visit Sergio Cedar, Sergio [name] 7/31/20238:09 AM

## 2021-12-31 ENCOUNTER — Telehealth (HOSPITAL_BASED_OUTPATIENT_CLINIC_OR_DEPARTMENT_OTHER): Payer: Medicare Other | Admitting: Student in an Organized Health Care Education/Training Program

## 2021-12-31 ENCOUNTER — Encounter (HOSPITAL_COMMUNITY): Payer: Self-pay | Admitting: Student in an Organized Health Care Education/Training Program

## 2021-12-31 DIAGNOSIS — F200 Paranoid schizophrenia: Secondary | ICD-10-CM

## 2021-12-31 DIAGNOSIS — F02818 Dementia in other diseases classified elsewhere, unspecified severity, with other behavioral disturbance: Secondary | ICD-10-CM | POA: Diagnosis not present

## 2021-12-31 DIAGNOSIS — Q909 Down syndrome, unspecified: Secondary | ICD-10-CM

## 2021-12-31 DIAGNOSIS — Q039 Congenital hydrocephalus, unspecified: Secondary | ICD-10-CM

## 2021-12-31 DIAGNOSIS — F5101 Primary insomnia: Secondary | ICD-10-CM

## 2021-12-31 DIAGNOSIS — Z79899 Other long term (current) drug therapy: Secondary | ICD-10-CM

## 2021-12-31 DIAGNOSIS — F209 Schizophrenia, unspecified: Secondary | ICD-10-CM

## 2021-12-31 MED ORDER — TRAZODONE HCL 100 MG PO TABS: 200.0000 mg | ORAL_TABLET | Freq: Every day | ORAL | 3 refills | Status: DC

## 2021-12-31 MED ORDER — HYDROXYZINE PAMOATE 50 MG PO CAPS: 50.0000 mg | ORAL_CAPSULE | Freq: Three times a day (TID) | ORAL | 0 refills | Status: DC | PRN

## 2021-12-31 MED ORDER — PALIPERIDONE ER 6 MG PO TB24: 6.0000 mg | ORAL_TABLET | Freq: Every day | ORAL | 1 refills | Status: DC

## 2021-12-31 NOTE — Progress Notes (Signed)
Virtual Visit via Telephone Note  I connected with Sergio [name] and his mother/Legal Sergio [name] on 12/31/21 at  8:00 AM EDT by telephone and verified that I am speaking with the correct person using two identifiers.  Location: Patient: Home Provider: [place]   I discussed the limitations, risks, security and privacy concerns of performing an evaluation and management service by telephone and the availability of in person appointments. I also discussed with the patient that there may be a patient responsible charge related to this service. The patient expressed understanding and agreed to proceed.   History of Present Illness:  Sergio [name] is a 36 yr old male who presents via telephone for follow up care and restart medication management.  PPHx is significant for Paranoid Schizophrenia, Down's Syndrome with Severe Impairment, and has Legal Guardians (parents Sergio [name] and Sergio [name]), no Suicide Attempts, 1 episode of attempting to cut Shunt out of head due to command hallucinations, and no hospitalizations.   Interview was conducted with the patient's legal guardian/mother Sergio [name] as "Sergio [name]" did not want to talk.  She reports that he has done well restarting the [place].  She reports no noticeable side effects from his medications.  She does report patient has started displaying a new behavior in the last couple weeks.  She reports that when they sit down for dinner patient will occasionally get up and walk around the table twice before sitting down and when asked why he is doing this he does not comment.  She reports that prior to sitting down he will rotate his chair to time all the way around and then said that it.  She also reports he will occasionally turn his food (gives a specific example of a [employer] sandwich on its wrapper) 2 times before eating.  She reports that this behavior is only done in the context of an eating and does not hinder his ability to eat or  impact his day-to-day functioning.  Discussed with her that as long as these behaviors are not impacting his day-to-day function it would be best to continue to monitor to see if these behaviors go away on their own.  Discussed that if they begin to worsen and impact his life we could consider adding an antidepressant to treat for OCD but for right now we will just monitor and she was agreeable with this.  She reports that he has improved with the [facility] specifically stating there have been no behavioral outbursts due to his delusions he is still having delusions.  She reports that while he is not talking to the his delusions that he is still hearing them because he will look at them.  Discussed increasing his Lorayne Bender and she was agreeable to that.  Discussed with her that he has not had lab work done since October of last year.  Discussed that since he is on an antipsychotic it is important for routine monitoring.  Discussed that he would need the following-A1c, lipid panel, CBC, CMP, and EKG.  She reported understanding and that she would make an appointment with his PCP's office to obtain these.  She reports that he is not having SI or HI.  She reports he continues to see and hear the 3 "weirdos" not acting out due to them.  She reports that his appetite is doing good.  She reports that his sleep is ok.  She reports no other concerns at present.  He will have a follow up appointment in  approximately 4 weeks.    Observations/Objective: Patient is usually limited in speech but today did not want to talk at all so his mother Sergio [name] provided information about the patient.  She reports that he is at his baseline for verbal interactions with others.  She reports his behavior has been good without any outbursts.  She reports he has had no SI or HI.  She reports he is continuing to respond to internal stimuli but that he is not talking to his delusions or acting out anything that they are saying to him.  She  reports that his sleep is doing fine.  Assessment and Plan:  Sergio [name] is a 36 yr old male who presents via telephone for follow up care and restart medication management.  PPHx is significant for Paranoid Schizophrenia, Down's Syndrome with Severe Impairment, and has Legal Guardians (parents Sergio [name] and Sergio [name]), no Suicide Attempts, 1 episode of attempting to cut Shunt out of head due to command hallucinations, and no hospitalizations.   Per [facility]" has responded well to the restart of his Invega.  She does note that he has developed new behaviors in the last weeks since restarting the medicine.  She describes her wrists also eating food.  At this point they are distressing significantly all to his day-to-day functioning so at this point we will not make any interventions but should these behaviors worsen and begin impacting his day-to-day function may consider starting an SSRI for OCD.  He has not had any behavioral issues, however, he still is responding to internal stimuli (the 3 "weirdos").  We will increase his Invega to hopefully reduce the impact of his delusions as these are most likely fixed.  Discussed the importance of getting updated labs as he is psychotic.  Please see orders for-A1c, CBC, CMP, lipid panel, and EKG.  His mother will schedule an appointment with his PCP to have these taken.  We will have a follow-up appointment in approximately 4 weeks.   Paranoid Schizophrenia: -Increase Invega to 6 mg QHS for psychosis.  30 tablets with 1 refills. -Continue Trazodone 200 mg QHS for insomnia. 90 tablets with 3 refills. -Continue Hydroxyzine 50 mg PRN TID for anxiety.  90 tablets with 0 refills.    Follow Up Instructions:    I discussed the assessment and treatment plan with the patient. The patient was provided an opportunity to ask questions and all were answered. The patient agreed with the plan and demonstrated an understanding of the instructions.    The patient was advised to call back or seek an in-person evaluation if the symptoms worsen or if the condition fails to improve as anticipated.  I provided 25 minutes of non-face-to-face time during this encounter.   Briant Cedar, MD

## 2022-01-11 ENCOUNTER — Other Ambulatory Visit: Payer: Self-pay | Admitting: Family Medicine

## 2022-02-04 ENCOUNTER — Telehealth (HOSPITAL_COMMUNITY): Payer: Medicare Other | Admitting: Student in an Organized Health Care Education/Training Program

## 2022-02-04 ENCOUNTER — Telehealth (HOSPITAL_COMMUNITY): Payer: Self-pay | Admitting: Student in an Organized Health Care Education/Training Program

## 2022-02-04 DIAGNOSIS — F209 Schizophrenia, unspecified: Secondary | ICD-10-CM

## 2022-02-04 DIAGNOSIS — Q909 Down syndrome, unspecified: Secondary | ICD-10-CM | POA: Diagnosis not present

## 2022-02-04 DIAGNOSIS — F02818 Dementia in other diseases classified elsewhere, unspecified severity, with other behavioral disturbance: Secondary | ICD-10-CM | POA: Diagnosis not present

## 2022-02-04 DIAGNOSIS — F5101 Primary insomnia: Secondary | ICD-10-CM

## 2022-02-04 DIAGNOSIS — G4700 Insomnia, unspecified: Secondary | ICD-10-CM | POA: Diagnosis not present

## 2022-02-04 MED ORDER — TRAZODONE HCL 100 MG PO TABS: 200.0000 mg | ORAL_TABLET | Freq: Every day | ORAL | 1 refills | Status: DC

## 2022-02-04 MED ORDER — PALIPERIDONE ER 6 MG PO TB24: 6.0000 mg | ORAL_TABLET | Freq: Every day | ORAL | 1 refills | Status: DC

## 2022-02-04 NOTE — Progress Notes (Addendum)
Virtual Visit via Telephone Note  I connected with TASEAN MANCHA III on 02/04/22 at  8:30 AM EDT by telephone and verified that I am speaking with the correct person using two identifiers.  Location: Patient: Home Provider: [place]   I discussed the limitations, risks, security and privacy concerns of performing an evaluation and management service by telephone and the availability of in person appointments. I also discussed with the patient that there may be a patient responsible charge related to this service. The patient expressed understanding and agreed to proceed.   History of Present Illness:  Attila "[name]" Jernigan III is a 36 yr old male who presents via telephone for follow up care and medication management.  PPHx is significant for Paranoid Schizophrenia, Down's Syndrome with Severe Impairment, and has Legal Guardians (parents [place] and [employer]), no Suicide Attempts, 1 episode of attempting to cut Shunt out of head due to command hallucinations, and no hospitalizations.    Interview was conducted with the patient's legal guardian/mother [name] as "[name]" still is not wanting to talk on the phone much.  She reports that since the increase in his [place] Bender he has done much better.  She reports that he is acting more like himself prior to not being on any of his medications.  She reports that his interactions with the "3 weirdos" are significantly reduced.  She reports he will still look towards spaces that are not occupied from time to time and she reports they can hear him talking to them in his room, however, they are much less intrusive and commanding as they had been.  She does report that his OCD like behaviors of circling the table or chairs prior to eating has gone away.  She reports that last time he did it was about 2 weeks ago.  She reports he started a new behavior of sliding his chair down towards the end of the table but that it is not that intrusive of behavior and does  not impact anything else in his life.  Discussed we would continue to monitor this but would not need to do anything at this time.  She reports she has not been able to get the updated blood work yet as the office to go to was purchased by [facility] and there is a Programmer, systems for [employer].  She reports she will be taking him to get that blood work done in the next few weeks.  She reports that both herself and trip her head had head cold the last 2 weeks but that they are both feeling better now.  She reports he has never made any mention of SI or HI.  She reports he is continuing to have auditory and visual hallucinations in the form of the "3 weirdos" but that they are much less intrusive in his day-to-day life.  She reports his sleep is doing good.  She reports his appetite is doing good.  She reports no other concerns at present.  They will return for follow-up in approximately 6 weeks.     Observations/Objective:  Patient is usually limited in speech but today did not want to talk at all so his mother [name] provided information about the patient.  She reports that his behavior continues to be good without outbursts.  She reports his interactions with other have increased since the increase in [place].  She reports he still is responding to internal stimuli but is able to block them/ignore them and that they are not as  ever present as they had been.  She reports that he has had no SI or HI.  She reports his sleep is good.  Assessment and Plan:  Renny "[name]" Forbis III is a 36 yr old male who presents via telephone for follow up care and medication management.  PPHx is significant for Paranoid Schizophrenia, Down's Syndrome with Severe Impairment, and has Legal Guardians (parents [employer] and [employer]), no Suicide Attempts, 1 episode of attempting to cut Shunt out of head due to command hallucinations, and no hospitalizations.    Per [facility]" has done much better since the increase in his Invega.   His hallucinations the "3 weirdos" while still present are not as distracting and he is able to ignore them for long stretches of time.  She reports that he is also no longer having the OCD like symptoms of circling the table or chairs when he sits down to eat.  He is having some odd behavior of scooting his chair down the table but again this does not impact/interfere with his day-to-day life so we will continue to monitor this for now.  We will not make any changes to his medications at this time.  He will return for follow-up in approximately 6 weeks.   Paranoid Schizophrenia: -Continue Invega 6 mg QHS for psychosis.  30 tablets with 1 refills. -Continue Trazodone 200 mg QHS for insomnia. 60 (100 mg) tablets with 2 refills sent. -Continue Hydroxyzine 50 mg PRN TID for anxiety.  No refill sent at this time.   Follow Up Instructions:    I discussed the assessment and treatment plan with the patient. The patient was provided an opportunity to ask questions and all were answered. The patient agreed with the plan and demonstrated an understanding of the instructions.   The patient was advised to call back or seek an in-person evaluation if the symptoms worsen or if the condition fails to improve as anticipated.  I provided 15 minutes of non-face-to-face time during this encounter.   Briant Cedar, MD

## 2022-02-04 NOTE — Telephone Encounter (Signed)
Called patients mother [name] to schedule follow up and she mentioned she didn't get a reminder text for the appointment today. I called her back and let her know that there must have been a glitch in the system but that she should get text next time.

## 2022-03-26 ENCOUNTER — Telehealth (HOSPITAL_BASED_OUTPATIENT_CLINIC_OR_DEPARTMENT_OTHER): Payer: Medicare Other | Admitting: Student in an Organized Health Care Education/Training Program

## 2022-03-26 ENCOUNTER — Encounter (HOSPITAL_COMMUNITY): Payer: Self-pay | Admitting: Student in an Organized Health Care Education/Training Program

## 2022-03-26 DIAGNOSIS — F5101 Primary insomnia: Secondary | ICD-10-CM

## 2022-03-26 DIAGNOSIS — Q909 Down syndrome, unspecified: Secondary | ICD-10-CM | POA: Diagnosis not present

## 2022-03-26 DIAGNOSIS — F209 Schizophrenia, unspecified: Secondary | ICD-10-CM

## 2022-03-26 MED ORDER — HYDROXYZINE PAMOATE 50 MG PO CAPS: 50.0000 mg | ORAL_CAPSULE | Freq: Three times a day (TID) | ORAL | 1 refills | Status: DC | PRN

## 2022-03-26 MED ORDER — PALIPERIDONE ER 6 MG PO TB24: 6.0000 mg | ORAL_TABLET | Freq: Every day | ORAL | 2 refills | Status: DC

## 2022-03-26 MED ORDER — TRAZODONE HCL 100 MG PO TABS: 200.0000 mg | ORAL_TABLET | Freq: Every day | ORAL | 2 refills | Status: DC

## 2022-03-26 NOTE — Progress Notes (Signed)
Virtual Visit via Telephone Note  I connected with MICHA DOSANJH III on 03/26/22 at  1:00 PM EST by telephone and verified that I am speaking with the correct person using two identifiers.  Location: Patient: Home Provider: [place]   I discussed the limitations, risks, security and privacy concerns of performing an evaluation and management service by telephone and the availability of in person appointments. I also discussed with the patient that there may be a patient responsible charge related to this service. The patient expressed understanding and agreed to proceed.   History of Present Illness:  Audel "[name]" Cutter III is a 36 yr old male who presents via telephone for follow up care and medication management.  PPHx is significant for Paranoid Schizophrenia, Down's Syndrome with Severe Impairment, and has Legal Guardians (parents [place] and [employer]), no Suicide Attempts, 1 episode of attempting to cut Shunt out of head due to command hallucinations, and no hospitalizations.    Interview was conducted with the patient's legal guardian/mother [name] as "[name]" does not want to talk on the phone.  She reports that he has continued to do well.  She reports that the behaviors he had been doing of circling the table or chairs when he was 5 that he had completely stopped.  He reports that he is at his baseline.  She reports that he is not appearing to have any issues with his medications.  She reports no concerns.  She reports he does continue to have his baseline chronic AVH but that he is able to still function in his day-to-day life and it is not distressing.  She reports he has never made any indication of SI or HI.  She reports sleep is good.  She reports appetite is good.  Discussed with her that we would not make any medication changes today since he is doing so well and she was agreeable to this.  He will return for follow-up in approximately 3 months.     Observations/Objective:  Patient used to be limited in speech and so again information was provided by his mother/legal guardian [name].  She reports that his behavior continues to be stable.  She reports his interactions with them have been good.  She reports he is still responding to internal stimuli but it is not distressing and he is able to ignore them at times.  She reports he has no SI or HI.  She reports his sleep is good.  Assessment and Plan:  Shelly "[name]" Hofmann III is a 36 yr old male who presents via telephone for follow up care and medication management.  PPHx is significant for Paranoid Schizophrenia, Down's Syndrome with Severe Impairment, and has Legal Guardians (parents [place] and [employer]), no Suicide Attempts, 1 episode of attempting to cut Shunt out of head due to command hallucinations, and no hospitalizations.     Per [facility]" is continuing to do well on his current medication regiment.  She reports that his chronic AVH is not distressing and he is at his baseline.  We will not make any medication changes at this time.  He will return for follow up in approximately 3 months.   Paranoid Schizophrenia: -Continue Invega 6 mg QHS for psychosis.  30 tablets with 2 refills. -Continue Trazodone 200 mg QHS for insomnia. 60 (100 mg) tablets with 2 refills sent. -Continue Hydroxyzine 50 mg PRN TID for anxiety.  90 tablets with 1 refill.   Follow Up Instructions:    I  discussed the assessment and treatment plan with the patient. The patient was provided an opportunity to ask questions and all were answered. The patient agreed with the plan and demonstrated an understanding of the instructions.   The patient was advised to call back or seek an in-person evaluation if the symptoms worsen or if the condition fails to improve as anticipated.  I provided 8 minutes of non-face-to-face time during this encounter.   Briant Cedar, MD

## 2022-05-20 ENCOUNTER — Ambulatory Visit (INDEPENDENT_AMBULATORY_CARE_PROVIDER_SITE_OTHER): Payer: Medicare Other

## 2022-05-20 DIAGNOSIS — Z0000 Encounter for general adult medical examination without abnormal findings: Secondary | ICD-10-CM | POA: Diagnosis not present

## 2022-05-20 NOTE — Patient Instructions (Signed)
Mr. Sergio [name] , Thank you for taking time to come for your Medicare Wellness Visit. I appreciate your ongoing commitment to your health goals. Please review the following plan we discussed and let me know if I can assist you in the future.   These are the goals we discussed:  Goals      Exercise 150 min/wk Moderate Activity        This is a list of the screening recommended for you and due dates:  Health Maintenance  Topic Date Due   DTaP/Tdap/Td vaccine (4 - Tdap) 03/07/1997   Hepatitis C Screening: USPSTF Recommendation to screen - Ages 16-79 yo.  Never done   COVID-19 Vaccine (2 - Moderna risk series) 05/23/2020   Flu Shot  12/04/2021   Medicare Annual Wellness Visit  05/21/2023   HIV Screening  Completed   HPV Vaccine  Aged Out    Advanced directives: Please bring a copy of your health care power of attorney and living will to the office to be added to your chart at your convenience.   Conditions/risks identified: Aim for 30 minutes of exercise or brisk walking, 6-8 glasses of water, and 5 servings of fruits and vegetables each day.   Next appointment: Follow up in one year for your annual wellness visit   Preventive Care 37-63 Years Old, Male Preventive care refers to lifestyle choices and visits with your health care provider that can promote health and wellness. Preventive care visits are also called wellness exams. What can I expect for my preventive care visit? Counseling During your preventive care visit, your health care provider may ask about your: Medical history, including: Past medical problems. Family medical history. Current health, including: Emotional well-being. Home life and relationship well-being. Sexual activity. Lifestyle, including: Alcohol, nicotine or tobacco, and drug use. Access to firearms. Diet, exercise, and sleep habits. Safety issues such as seatbelt and bike helmet use. Sunscreen use. Work and work Statistician. Physical exam Your  health care provider may check your: Height and weight. These may be used to calculate your BMI (body mass index). BMI is a measurement that tells if you are at a healthy weight. Waist circumference. This measures the distance around your waistline. This measurement also tells if you are at a healthy weight and may help predict your risk of certain diseases, such as type 2 diabetes and high blood pressure. Heart rate and blood pressure. Body temperature. Skin for abnormal spots. What immunizations do I need? Vaccines are usually given at various ages, according to a schedule. Your health care provider will recommend vaccines for you based on your age, medical history, and lifestyle or other factors, such as travel or where you work. What tests do I need? Screening Your health care provider may recommend screening tests for certain conditions. This may include: Lipid and cholesterol levels. Diabetes screening. This is done by checking your blood sugar (glucose) after you have not eaten for a while (fasting). Hepatitis B test. Hepatitis C test. HIV (human immunodeficiency virus) test. STI (sexually transmitted infection) testing, if you are at risk. Talk with your health care provider about your test results, treatment options, and if necessary, the need for more tests. Follow these instructions at home: Eating and drinking  Eat a healthy diet that includes fresh fruits and vegetables, whole grains, lean protein, and low-fat dairy products. Drink enough fluid to keep your urine pale yellow. Take vitamin and mineral supplements as recommended by your health care provider. Do not drink alcohol if  your health care provider tells you not to drink. If you drink alcohol: Limit how much you have to 0-2 drinks a day. Know how much alcohol is in your drink. In the [place] one drink equals one 12 oz bottle of beer (355 mL), one 5 oz glass of wine (148 mL), or one 1 oz glass of hard liquor (44  mL). Lifestyle Brush your teeth every morning and night with fluoride toothpaste. Floss one time each day. Exercise for at least 30 minutes 5 or more days each week. Do not use any products that contain nicotine or tobacco. These products include cigarettes, chewing tobacco, and vaping devices, such as e-cigarettes. If you need help quitting, ask your health care provider. Do not use drugs. If you are sexually active, practice safe sex. Use a condom or other form of protection to prevent STIs. Find healthy ways to manage stress, such as: Meditation, yoga, or listening to music. Journaling. Talking to a trusted person. Spending time with friends and family. Minimize exposure to UV radiation to reduce your risk of skin cancer. Safety Always wear your seat belt while driving or riding in a vehicle. Do not drive: If you have been drinking alcohol. Do not ride with someone who has been drinking. If you have been using any mind-altering substances or drugs. While texting. When you are tired or distracted. Wear a helmet and other protective equipment during sports activities. If you have firearms in your house, make sure you follow all gun safety procedures. Seek help if you have been physically or sexually abused. What's next? Go to your health care provider once a year for an annual wellness visit. Ask your health care provider how often you should have your eyes and teeth checked. Stay up to date on all vaccines. This information is not intended to replace advice given to you by your health care provider. Make sure you discuss any questions you have with your health care provider. Document Revised: 10/18/2020 Document Reviewed: 10/18/2020 Elsevier Patient Education  [place].

## 2022-05-20 NOTE — Progress Notes (Signed)
Subjective:   Sergio [name] is a 37 y.o. male who presents for Medicare Annual/Subsequent preventive examination.  I connected with  Sergio [name] on 05/20/22 by a audio enabled telemedicine application and verified that I am speaking with the correct person using two identifiers.  Patient Location: Home  Provider Location: Home Office  I discussed the limitations of evaluation and management by telemedicine. The patient expressed understanding and agreed to proceed.   Review of Systems     Cardiac Risk Factors include: male gender;obesity (BMI >30kg/m2);sedentary lifestyle     Objective:    Today's Vitals   There is no height or weight on file to calculate BMI.     05/20/2022    3:19 PM 05/17/2021    2:51 PM 05/22/2017    2:54 PM 07/02/2012    5:07 PM 07/06/2011    3:00 AM  Advanced Directives  Does Patient Have a Medical Advance Directive? Yes Unable to assess, patient is non-responsive or altered mental status No Patient does not have advance directive;Patient would not like information Patient does not have advance directive  Type of Advance Directive Living will;Healthcare Power of Attorney      Does patient want to make changes to medical advance directive? No - Patient declined      Copy of [place] in Chart? No - copy requested      Pre-existing out of facility DNR order (yellow form or pink MOST form)     No    Current Medications (verified) Outpatient Encounter Medications as of 05/20/2022  Medication Sig   hydrOXYzine (VISTARIL) 50 MG capsule Take 1 capsule (50 mg total) by mouth 3 (three) times daily as needed.   ipratropium-albuterol (DUONEB) 0.5-2.5 (3) MG/3ML SOLN NEBULIZE 1 VIAL EVERY 6 HOURS AS NEEDED FOR WHEEZING OR SHORTNESS OF BREATH   ketoconazole (NIZORAL) 2 % cream Apply topically daily as needed for irritation (rash in groin). X14 days per flare until cleared   ketoconazole (NIZORAL) 2 % shampoo APPLY TOPICALLY 2 TIMES A  WEEK   Melatonin 10 MG TABS Take 10 mg by mouth at bedtime.    metroNIDAZOLE (METROCREAM) 0.75 % cream APPLY TO THE AFFECTED AREA(S) TWICE DAILY FOR ROSACEA OF FACE   paliperidone (INVEGA) 6 MG 24 hr tablet Take 1 tablet (6 mg total) by mouth at bedtime.   traZODone (DESYREL) 100 MG tablet Take 2 tablets (200 mg total) by mouth at bedtime.   Vitamin D, Ergocalciferol, (DRISDOL) 1.25 MG (50000 UNIT) CAPS capsule Take 1 capsule (50,000 Units total) by mouth every 7 (seven) days.   No facility-administered encounter medications on file as of 05/20/2022.    Allergies (verified) Cefaclor, Penicillins, Clindamycin, and Clindamycin/lincomycin   History: Past Medical History:  Diagnosis Date   Dementia ([place])    Difficulty swallowing    Down's syndrome    Dysphagia causing pulmonary aspiration with swallowing    GERD (gastroesophageal reflux disease)    Headache(784.0)    Hydrocephalus ([facility])    Insomnia    Memory loss    OSA (obstructive sleep apnea)    Pituitary adenoma ([place])    Schizoaffective disorder    Weakness    Past Surgical History:  Procedure Laterality Date   BRAIN SURGERY     CSF SHUNT     TYMPANOSTOMY TUBE PLACEMENT Bilateral    VENTRICULOPERITONEAL SHUNT     Family History  Problem Relation Age of Onset   Hypertension Mother    Renal  Disease Father    Emphysema Maternal Grandfather        Died at 73   Social History   Socioeconomic History   Marital status: Single    Spouse name: Not on file   Number of children: Not on file   Years of education: Not on file   Highest education level: Not on file  Occupational History   Occupation: disabled  Tobacco Use   Smoking status: Never   Smokeless tobacco: Never  Vaping Use   Vaping Use: Never used  Substance and Sexual Activity   Alcohol use: No   Drug use: No   Sexual activity: Never  Other Topics Concern   Not on file  Social History Narrative   Sergio [name] is a 37 year old male.   He lives with both of his  parents [name] and [name]). He has 1 sister,who is 32.    He enjoys making his bed and watching movies. He has 700 movies.    Down's syndrome and schizophrenia - very dependent on his mother, [name]   Social Determinants of Health   Financial Resource Strain: Medium Risk (05/17/2021)   Overall Financial Resource Strain (CARDIA)    Difficulty of Paying Living Expenses: Somewhat hard  Food Insecurity: Food Insecurity Present (05/17/2021)   Hunger Vital Sign    Worried About Running Out of Food in the Last Year: Sometimes true    Ran Out of Food in the Last Year: Never true  Transportation Needs: No Transportation Needs (05/17/2021)   PRAPARE - Hydrologist (Medical): No    Lack of Transportation (Non-Medical): No  Physical Activity: Inactive (05/17/2021)   Exercise Vital Sign    Days of Exercise per Week: 0 days    Minutes of Exercise per Session: 0 min  Stress: Stress Concern Present (05/17/2021)   [place]    Feeling of Stress : Rather much  Social Connections: Moderately Integrated (05/17/2021)   Social Connection and Isolation Panel [NHANES]    Frequency of Communication with Friends and Family: Never    Frequency of Social Gatherings with Friends and Family: More than three times a week    Attends Religious Services: 1 to 4 times per year    Active Member of [employer] or Organizations: Yes    Attends Archivist Meetings: 1 to 4 times per year    Marital Status: Never married    Tobacco Counseling Counseling given: Not Answered   Clinical Intake:  Pre-visit preparation completed: Yes  Pain : No/denies pain     Diabetes: No  How often do you need to have someone help you when you read instructions, pamphlets, or other written materials from your doctor or pharmacy?: 4 - Often  Diabetic?No   Interpreter Needed?: No  Comments: Assisted with visit by mother Information  entered by :: Denman George LPN   Activities of Daily Living    05/20/2022    3:19 PM  In your present state of health, do you have any difficulty performing the following activities:  Hearing? 0  Vision? 0  Difficulty concentrating or making decisions? 0  Walking or climbing stairs? 0  Dressing or bathing? 0  Doing errands, shopping? 0  Preparing Food and eating ? N  Using the Toilet? N  In the past six months, have you accidently leaked urine? N  Do you have problems with loss of bowel control? N  Managing your Medications?  N  Managing your Finances? N  Housekeeping or managing your Housekeeping? N    Patient Care Team: Janora Norlander, DO as PCP - General (Family Medicine) Hampton Abbot, MD as Consulting Physician (Psychiatry)  Indicate any recent Medical Services you may have received from other than [facility] providers in the past year (date may be approximate).     Assessment:   This is a routine wellness examination for Krishang.  Hearing/Vision screen Hearing Screening - Comments:: Denies hearing difficulties  Vision Screening - Comments:: Denies vision problems    Dietary issues and exercise activities discussed: Current Exercise Habits: The patient does not participate in regular exercise at present   Goals Addressed   None   Depression Screen    05/17/2021    2:54 PM 03/05/2021    3:22 PM 11/09/2018    4:13 PM 12/30/2017    6:36 PM 11/11/2017    2:41 PM 06/30/2017    3:15 PM 02/26/2017   11:31 AM  PHQ 2/9 Scores  PHQ - 2 Score 0 0  0 0 0 0  PHQ- 9 Score  5       Exception Documentation   Medical reason        Fall Risk    05/20/2022    3:14 PM 05/17/2021    2:57 PM 03/05/2021    3:22 PM 12/30/2017    6:36 PM 11/11/2017    2:41 PM  [place] in the past year? 0 1 1 No No  Number falls in past yr: 0 1 0    Injury with Fall? 0 0 0    Risk for fall due to : No Fall Risks Impaired balance/gait;History of fall(s) History of fall(s)    Follow up  Falls evaluation completed;Education provided;Falls prevention discussed Education provided;Falls prevention discussed Education provided      FALL RISK PREVENTION PERTAINING TO THE HOME:  Any stairs in or around the home? No  If so, are there any without handrails? No  Home free of loose throw rugs in walkways, pet beds, electrical cords, etc? Yes  Adequate lighting in your home to reduce risk of falls? Yes   ASSISTIVE DEVICES UTILIZED TO PREVENT FALLS:  Life alert? No  Use of a cane, walker or w/c? No  Grab bars in the bathroom? Yes  Shower chair or bench in shower? No  Elevated toilet seat or a handicapped toilet? Yes   TIMED UP AND GO:  Was the test performed? No . Telephonic visit   Cognitive Function:    05/17/2021    2:58 PM  MMSE - Mini Mental State Exam  Not completed: Unable to complete        Immunizations Immunization History  Administered Date(s) Administered   DTaP 09/17/1988, 10/27/1990, 11/18/1991   HIB (PRP-OMP) 09/17/1988   Hepatitis B 01/24/2000, 03/03/2000, 07/21/2000   IPV 09/17/1988, 10/27/1990, 11/18/1991   Influenza Inj Mdck Quad Pf 07/04/2018   Influenza,inj,Quad PF,6+ Mos 05/11/2013, 03/24/2014, 04/08/2017, 03/05/2021   Influenza-Unspecified 03/24/2014, 02/03/2015, 04/22/2017   MMR 11/18/1991   Moderna Sars-Covid-2 Vaccination 04/25/2020   PPD Test 01/31/2014    TDAP status: Due, Education has been provided regarding the importance of this vaccine. Advised may receive this vaccine at local pharmacy or Health Dept. Aware to provide a copy of the vaccination record if obtained from local pharmacy or Health Dept. Verbalized acceptance and understanding.  Flu Vaccine status: Up to date  Pneumococcal vaccine status: Up to date  Covid-19 vaccine status:  Information provided on how to obtain vaccines.   Qualifies for Shingles Vaccine? No    Screening Tests Health Maintenance  Topic Date Due   DTaP/Tdap/Td (4 - Tdap) 03/07/1997   Hepatitis  C Screening  Never done   COVID-19 Vaccine (2 - Moderna risk series) 05/23/2020   INFLUENZA VACCINE  12/04/2021   Medicare Annual Wellness (AWV)  05/21/2023   HIV Screening  Completed   HPV VACCINES  Aged Out    Health Maintenance  Health Maintenance Due  Topic Date Due   DTaP/Tdap/Td (4 - Tdap) 03/07/1997   Hepatitis C Screening  Never done   COVID-19 Vaccine (2 - Moderna risk series) 05/23/2020   INFLUENZA VACCINE  12/04/2021    Lung Cancer Screening: (Low Dose CT Chest recommended if Age 42-80 years, 30 pack-year currently smoking OR have quit w/in 15years.) does not qualify.   Lung Cancer Screening Referral: n/a   Additional Screening:  Hepatitis C Screening: does not qualify  Vision Screening: Recommended annual ophthalmology exams for early detection of glaucoma and other disorders of the eye. Is the patient up to date with their annual eye exam?  No  Who is the provider or what is the name of the office in which the patient attends annual eye exams? None  If pt is not established with a provider, would they like to be referred to a provider to establish care? No .   Dental Screening: Recommended annual dental exams for proper oral hygiene  Community Resource Referral / Chronic Care Management: CRR required this visit?  No   CCM required this visit?  No      Plan:     I have personally reviewed and noted the following in the patient's chart:   Medical and social history Use of alcohol, tobacco or illicit drugs  Current medications and supplements including opioid prescriptions. Patient is not currently taking opioid prescriptions. Functional ability and status Nutritional status Physical activity Advanced directives List of other physicians Hospitalizations, surgeries, and ER visits in previous 12 months Vitals Screenings to include cognitive, depression, and falls Referrals and appointments  In addition, I have reviewed and discussed with patient  certain preventive protocols, quality metrics, and best practice recommendations. A written personalized care plan for preventive services as well as general preventive health recommendations were provided to patient.     Denman George [place], [place]   12/17/4816   Nurse Notes: Patient's mother would like to know if Nizoral cream can be changed so that they can get a larger quantity or more than 1 fill per month.

## 2022-05-26 ENCOUNTER — Other Ambulatory Visit: Payer: Self-pay

## 2022-05-26 ENCOUNTER — Encounter (HOSPITAL_BASED_OUTPATIENT_CLINIC_OR_DEPARTMENT_OTHER): Payer: Self-pay

## 2022-05-26 ENCOUNTER — Emergency Department (HOSPITAL_BASED_OUTPATIENT_CLINIC_OR_DEPARTMENT_OTHER): Payer: Medicare Other

## 2022-05-26 ENCOUNTER — Inpatient Hospital Stay (HOSPITAL_COMMUNITY): Payer: Medicare Other

## 2022-05-26 ENCOUNTER — Inpatient Hospital Stay (HOSPITAL_BASED_OUTPATIENT_CLINIC_OR_DEPARTMENT_OTHER)
Admission: EM | Admit: 2022-05-26 | Discharge: 2022-05-31 | DRG: 186 | Disposition: A | Discharge status: Home Health | Payer: Medicare Other | Attending: Internal Medicine | Admitting: Internal Medicine

## 2022-05-26 DIAGNOSIS — Z4682 Encounter for fitting and adjustment of non-vascular catheter: Secondary | ICD-10-CM | POA: Diagnosis not present

## 2022-05-26 DIAGNOSIS — Z8249 Family history of ischemic heart disease and other diseases of the circulatory system: Secondary | ICD-10-CM

## 2022-05-26 DIAGNOSIS — R0600 Dyspnea, unspecified: Secondary | ICD-10-CM | POA: Diagnosis not present

## 2022-05-26 DIAGNOSIS — K219 Gastro-esophageal reflux disease without esophagitis: Secondary | ICD-10-CM | POA: Diagnosis not present

## 2022-05-26 DIAGNOSIS — Z1152 Encounter for screening for COVID-19: Secondary | ICD-10-CM | POA: Diagnosis not present

## 2022-05-26 DIAGNOSIS — J9811 Atelectasis: Secondary | ICD-10-CM | POA: Diagnosis present

## 2022-05-26 DIAGNOSIS — Z982 Presence of cerebrospinal fluid drainage device: Secondary | ICD-10-CM

## 2022-05-26 DIAGNOSIS — Z825 Family history of asthma and other chronic lower respiratory diseases: Secondary | ICD-10-CM | POA: Diagnosis not present

## 2022-05-26 DIAGNOSIS — J90 Pleural effusion, not elsewhere classified: Secondary | ICD-10-CM | POA: Diagnosis not present

## 2022-05-26 DIAGNOSIS — Z881 Allergy status to other antibiotic agents status: Secondary | ICD-10-CM | POA: Diagnosis not present

## 2022-05-26 DIAGNOSIS — Q909 Down syndrome, unspecified: Secondary | ICD-10-CM | POA: Diagnosis not present

## 2022-05-26 DIAGNOSIS — R739 Hyperglycemia, unspecified: Secondary | ICD-10-CM | POA: Diagnosis present

## 2022-05-26 DIAGNOSIS — J9601 Acute respiratory failure with hypoxia: Principal | ICD-10-CM | POA: Diagnosis present

## 2022-05-26 DIAGNOSIS — Z79899 Other long term (current) drug therapy: Secondary | ICD-10-CM | POA: Diagnosis not present

## 2022-05-26 DIAGNOSIS — F0390 Unspecified dementia without behavioral disturbance: Secondary | ICD-10-CM | POA: Diagnosis present

## 2022-05-26 DIAGNOSIS — R52 Pain, unspecified: Secondary | ICD-10-CM | POA: Diagnosis not present

## 2022-05-26 DIAGNOSIS — R1310 Dysphagia, unspecified: Secondary | ICD-10-CM | POA: Diagnosis present

## 2022-05-26 DIAGNOSIS — J189 Pneumonia, unspecified organism: Secondary | ICD-10-CM | POA: Diagnosis present

## 2022-05-26 DIAGNOSIS — E871 Hypo-osmolality and hyponatremia: Secondary | ICD-10-CM

## 2022-05-26 DIAGNOSIS — Z6841 Body Mass Index (BMI) 40.0 and over, adult: Secondary | ICD-10-CM | POA: Diagnosis not present

## 2022-05-26 DIAGNOSIS — F259 Schizoaffective disorder, unspecified: Secondary | ICD-10-CM | POA: Diagnosis present

## 2022-05-26 DIAGNOSIS — G4733 Obstructive sleep apnea (adult) (pediatric): Secondary | ICD-10-CM | POA: Diagnosis present

## 2022-05-26 DIAGNOSIS — I3139 Other pericardial effusion (noninflammatory): Secondary | ICD-10-CM | POA: Diagnosis not present

## 2022-05-26 DIAGNOSIS — M25561 Pain in right knee: Secondary | ICD-10-CM | POA: Diagnosis present

## 2022-05-26 DIAGNOSIS — Z880 Allergy status to penicillin: Secondary | ICD-10-CM

## 2022-05-26 DIAGNOSIS — R062 Wheezing: Secondary | ICD-10-CM | POA: Diagnosis not present

## 2022-05-26 LAB — URINALYSIS, ROUTINE W REFLEX MICROSCOPIC
Bilirubin Urine: NEGATIVE
Glucose, UA: NEGATIVE mg/dL
Hgb urine dipstick: NEGATIVE
Ketones, ur: NEGATIVE mg/dL
Leukocytes,Ua: NEGATIVE
Nitrite: NEGATIVE
Protein, ur: 30 mg/dL — AB
Specific Gravity, Urine: 1.03 (ref 1.005–1.030)
pH: 5 (ref 5.0–8.0)

## 2022-05-26 LAB — CBC WITH DIFFERENTIAL/PLATELET
Abs Immature Granulocytes: 0.06 10*3/uL (ref 0.00–0.07)
Basophils Absolute: 0.1 10*3/uL (ref 0.0–0.1)
Basophils Relative: 1 %
Eosinophils Absolute: 0 10*3/uL (ref 0.0–0.5)
Eosinophils Relative: 0 %
HCT: 46.8 % (ref 39.0–52.0)
Hemoglobin: 15.5 g/dL (ref 13.0–17.0)
Immature Granulocytes: 0 %
Lymphocytes Relative: 9 %
Lymphs Abs: 1.3 10*3/uL (ref 0.7–4.0)
MCH: 31.8 pg (ref 26.0–34.0)
MCHC: 33.1 g/dL (ref 30.0–36.0)
MCV: 96.1 fL (ref 80.0–100.0)
Monocytes Absolute: 0.7 10*3/uL (ref 0.1–1.0)
Monocytes Relative: 5 %
Neutro Abs: 12.2 10*3/uL — ABNORMAL HIGH (ref 1.7–7.7)
Neutrophils Relative %: 85 %
Platelets: 325 10*3/uL (ref 150–400)
RBC: 4.87 MIL/uL (ref 4.22–5.81)
RDW: 14.2 % (ref 11.5–15.5)
WBC: 14.5 10*3/uL — ABNORMAL HIGH (ref 4.0–10.5)
nRBC: 0 % (ref 0.0–0.2)

## 2022-05-26 LAB — COMPREHENSIVE METABOLIC PANEL WITH GFR
ALT: 22 U/L (ref 0–44)
AST: 21 U/L (ref 15–41)
Albumin: 3 g/dL — ABNORMAL LOW (ref 3.5–5.0)
Alkaline Phosphatase: 69 U/L (ref 38–126)
Anion gap: 8 (ref 5–15)
BUN: 16 mg/dL (ref 6–20)
CO2: 24 mmol/L (ref 22–32)
Calcium: 8 mg/dL — ABNORMAL LOW (ref 8.9–10.3)
Chloride: 98 mmol/L (ref 98–111)
Creatinine, Ser: 1.21 mg/dL (ref 0.61–1.24)
GFR, Estimated: >60 mL/min
Glucose, Bld: 183 mg/dL — ABNORMAL HIGH (ref 70–99)
Potassium: 3.6 mmol/L (ref 3.5–5.1)
Sodium: 130 mmol/L — ABNORMAL LOW (ref 135–145)
Total Bilirubin: 0.4 mg/dL (ref 0.3–1.2)
Total Protein: 7.5 g/dL (ref 6.5–8.1)

## 2022-05-26 LAB — I-STAT VENOUS BLOOD GAS, ED
Acid-Base Excess: 4 mmol/L — ABNORMAL HIGH (ref 0.0–2.0)
Bicarbonate: 29.1 mmol/L — ABNORMAL HIGH (ref 20.0–28.0)
Calcium, Ion: 1.1 mmol/L — ABNORMAL LOW (ref 1.15–1.40)
HCT: 46 % (ref 39.0–52.0)
Hemoglobin: 15.6 g/dL (ref 13.0–17.0)
O2 Saturation: 94 %
Patient temperature: 98.7
Potassium: 4.6 mmol/L (ref 3.5–5.1)
Sodium: 132 mmol/L — ABNORMAL LOW (ref 135–145)
TCO2: 30 mmol/L (ref 22–32)
pCO2, Ven: 45.6 mmHg (ref 44–60)
pH, Ven: 7.413 (ref 7.25–7.43)
pO2, Ven: 69 mmHg — ABNORMAL HIGH (ref 32–45)

## 2022-05-26 LAB — BODY FLUID CELL COUNT WITH DIFFERENTIAL
Eos, Fluid: 0 %
Lymphs, Fluid: 8 %
Monocyte-Macrophage-Serous Fluid: 13 % — ABNORMAL LOW (ref 50–90)
Neutrophil Count, Fluid: 79 % — ABNORMAL HIGH (ref 0–25)
Total Nucleated Cell Count, Fluid: 4566 cu mm — ABNORMAL HIGH (ref 0–1000)

## 2022-05-26 LAB — RESP PANEL BY RT-PCR (RSV, FLU A&B, COVID)  RVPGX2
Influenza A by PCR: NEGATIVE
Influenza B by PCR: NEGATIVE
Resp Syncytial Virus by PCR: NEGATIVE
SARS Coronavirus 2 by RT PCR: NEGATIVE

## 2022-05-26 LAB — GLUCOSE, CAPILLARY
Glucose-Capillary: 190 mg/dL — ABNORMAL HIGH (ref 70–99)
Glucose-Capillary: 184 mg/dL — ABNORMAL HIGH (ref 70–99)
Glucose-Capillary: 148 mg/dL — ABNORMAL HIGH (ref 70–99)
Glucose-Capillary: 155 mg/dL — ABNORMAL HIGH (ref 70–99)
Glucose-Capillary: 144 mg/dL — ABNORMAL HIGH (ref 70–99)

## 2022-05-26 LAB — LACTATE DEHYDROGENASE, PLEURAL OR PERITONEAL FLUID: LD, Fluid: 290 U/L — ABNORMAL HIGH (ref 3–23)

## 2022-05-26 LAB — GLUCOSE, PLEURAL OR PERITONEAL FLUID: Glucose, Fluid: 67 mg/dL

## 2022-05-26 LAB — SODIUM, URINE, RANDOM: Sodium, Ur: <10 mmol/L

## 2022-05-26 LAB — STREP PNEUMONIAE URINARY ANTIGEN: Strep Pneumo Urinary Antigen: NEGATIVE

## 2022-05-26 LAB — PROTEIN, PLEURAL OR PERITONEAL FLUID: Total protein, fluid: 5 g/dL

## 2022-05-26 LAB — PROTIME-INR
INR: 1.1 (ref 0.8–1.2)
Prothrombin Time: 14.5 s (ref 11.4–15.2)

## 2022-05-26 LAB — LACTIC ACID, PLASMA: Lactic Acid, Venous: 1.5 mmol/L (ref 0.5–1.9)

## 2022-05-26 LAB — MRSA NEXT GEN BY PCR, NASAL: MRSA by PCR Next Gen: DETECTED — AB

## 2022-05-26 MED ORDER — ALBUTEROL SULFATE (2.5 MG/3ML) 0.083% IN NEBU
2.5000 mg | INHALATION_SOLUTION | Freq: Once | RESPIRATORY_TRACT | Status: AC
  Administered 2022-05-26: 2.5 mg via RESPIRATORY_TRACT
  Filled 2022-05-26: qty 3

## 2022-05-26 MED ORDER — POLYETHYLENE GLYCOL 3350 17 G PO PACK: 17.0000 g | PACK | Freq: Every day | ORAL | Status: DC | PRN

## 2022-05-26 MED ORDER — INSULIN ASPART 100 UNIT/ML IJ SOLN
1.0000 [IU] | INTRAMUSCULAR | Status: DC
  Administered 2022-05-26 (×3): 2 [IU] via SUBCUTANEOUS
  Administered 2022-05-27 – 2022-05-30 (×5): 1 [IU] via SUBCUTANEOUS

## 2022-05-26 MED ORDER — CALCIUM GLUCONATE-NACL 1-0.675 GM/50ML-% IV SOLN
1.0000 g | Freq: Once | INTRAVENOUS | Status: AC
  Administered 2022-05-26: 1000 mg via INTRAVENOUS
  Filled 2022-05-26: qty 50

## 2022-05-26 MED ORDER — MELATONIN 5 MG PO TABS
10.0000 mg | ORAL_TABLET | Freq: Every day | ORAL | Status: DC
  Administered 2022-05-26 – 2022-05-30 (×5): 10 mg via ORAL
  Filled 2022-05-26 (×5): qty 2

## 2022-05-26 MED ORDER — SODIUM CHLORIDE (PF) 0.9 % IJ SOLN
10.0000 mg | Freq: Once | INTRAMUSCULAR | Status: AC
  Administered 2022-05-26: 10 mg via INTRAPLEURAL
  Filled 2022-05-26: qty 10

## 2022-05-26 MED ORDER — CHLORHEXIDINE GLUCONATE CLOTH 2 % EX PADS
6.0000 | MEDICATED_PAD | Freq: Every day | CUTANEOUS | Status: DC
  Administered 2022-05-26 – 2022-05-30 (×4): 6 via TOPICAL

## 2022-05-26 MED ORDER — STERILE WATER FOR INJECTION IJ SOLN
5.0000 mg | Freq: Once | RESPIRATORY_TRACT | Status: AC
  Administered 2022-05-26: 5 mg via INTRAPLEURAL
  Filled 2022-05-26: qty 5

## 2022-05-26 MED ORDER — MAGNESIUM SULFATE 2 GM/50ML IV SOLN
INTRAVENOUS | Status: AC
  Administered 2022-05-26: 2 g
  Filled 2022-05-26: qty 50

## 2022-05-26 MED ORDER — DOCUSATE SODIUM 100 MG PO CAPS: 100.0000 mg | ORAL_CAPSULE | Freq: Two times a day (BID) | ORAL | Status: DC | PRN

## 2022-05-26 MED ORDER — IPRATROPIUM-ALBUTEROL 0.5-2.5 (3) MG/3ML IN SOLN
3.0000 mL | RESPIRATORY_TRACT | Status: DC
  Administered 2022-05-26 (×2): 3 mL via RESPIRATORY_TRACT
  Filled 2022-05-26 (×2): qty 3

## 2022-05-26 MED ORDER — LACTATED RINGERS IV BOLUS
1000.0000 mL | Freq: Once | INTRAVENOUS | Status: AC
  Administered 2022-05-26: 1000 mL via INTRAVENOUS

## 2022-05-26 MED ORDER — TRAZODONE HCL 50 MG PO TABS
200.0000 mg | ORAL_TABLET | Freq: Every day | ORAL | Status: DC
  Administered 2022-05-26 – 2022-05-28 (×3): 200 mg via ORAL
  Filled 2022-05-26 (×4): qty 4

## 2022-05-26 MED ORDER — METHYLPREDNISOLONE SODIUM SUCC 125 MG IJ SOLR
INTRAMUSCULAR | Status: AC
  Administered 2022-05-26: 125 mg
  Filled 2022-05-26: qty 2

## 2022-05-26 MED ORDER — LEVOFLOXACIN IN D5W 750 MG/150ML IV SOLN
750.0000 mg | Freq: Once | INTRAVENOUS | Status: AC
  Administered 2022-05-26: 750 mg via INTRAVENOUS
  Filled 2022-05-26: qty 150

## 2022-05-26 MED ORDER — ORAL CARE MOUTH RINSE: 15.0000 mL | OROMUCOSAL | Status: DC | PRN

## 2022-05-26 MED ORDER — VANCOMYCIN HCL 750 MG/150ML IV SOLN
750.0000 mg | Freq: Two times a day (BID) | INTRAVENOUS | Status: DC
  Administered 2022-05-26 – 2022-05-27 (×2): 750 mg via INTRAVENOUS
  Filled 2022-05-26 (×2): qty 150

## 2022-05-26 MED ORDER — SODIUM CHLORIDE 0.9 % IV SOLN: 100.0000 mg | Freq: Two times a day (BID) | INTRAVENOUS | Status: DC

## 2022-05-26 MED ORDER — SODIUM CHLORIDE 0.9% FLUSH
10.0000 mL | Freq: Three times a day (TID) | INTRAVENOUS | Status: DC
  Administered 2022-05-26 – 2022-05-30 (×10): 10 mL via INTRAPLEURAL

## 2022-05-26 MED ORDER — FENTANYL CITRATE PF 50 MCG/ML IJ SOSY
25.0000 ug | PREFILLED_SYRINGE | INTRAMUSCULAR | Status: DC | PRN
  Administered 2022-05-26 – 2022-05-27 (×5): 50 ug via INTRAVENOUS
  Administered 2022-05-27: 25 ug via INTRAVENOUS
  Administered 2022-05-27 (×3): 50 ug via INTRAVENOUS
  Administered 2022-05-28 (×2): 25 ug via INTRAVENOUS
  Administered 2022-05-28: 50 ug via INTRAVENOUS
  Administered 2022-05-29 (×2): 25 ug via INTRAVENOUS
  Filled 2022-05-26 (×14): qty 1

## 2022-05-26 MED ORDER — HEPARIN SODIUM (PORCINE) 5000 UNIT/ML IJ SOLN
5000.0000 [IU] | Freq: Three times a day (TID) | INTRAMUSCULAR | Status: DC
  Administered 2022-05-26 – 2022-05-30 (×14): 5000 [IU] via SUBCUTANEOUS
  Filled 2022-05-26 (×14): qty 1

## 2022-05-26 MED ORDER — SODIUM CHLORIDE 0.9 % IV SOLN: INTRAVENOUS | Status: DC | PRN

## 2022-05-26 MED ORDER — VANCOMYCIN HCL IN DEXTROSE 1-5 GM/200ML-% IV SOLN
1000.0000 mg | INTRAVENOUS | Status: AC
  Administered 2022-05-26 (×2): 1000 mg via INTRAVENOUS
  Filled 2022-05-26 (×2): qty 200

## 2022-05-26 MED ORDER — IPRATROPIUM-ALBUTEROL 0.5-2.5 (3) MG/3ML IN SOLN: 3.0000 mL | Freq: Four times a day (QID) | RESPIRATORY_TRACT | Status: DC | PRN

## 2022-05-26 MED ORDER — PALIPERIDONE ER 6 MG PO TB24
6.0000 mg | ORAL_TABLET | Freq: Every day | ORAL | Status: DC
  Administered 2022-05-26 – 2022-05-28 (×3): 6 mg via ORAL
  Filled 2022-05-26 (×5): qty 1

## 2022-05-26 MED ORDER — LEVOFLOXACIN IN D5W 750 MG/150ML IV SOLN
750.0000 mg | INTRAVENOUS | Status: DC
  Administered 2022-05-27 – 2022-05-29 (×3): 750 mg via INTRAVENOUS
  Filled 2022-05-26 (×4): qty 150

## 2022-05-26 NOTE — ED Notes (Signed)
MD and ED staff'@bedside'$ , attempt imaging shortly.

## 2022-05-26 NOTE — Progress Notes (Addendum)
Pharmacy Antibiotic Note  Sergio [name] is a 37 y.o. male admitted on 05/26/2022 with pneumonia.  Pharmacy has been consulted for vancomycin and Levaquin dosing.  Plan: Vancomycin '2000mg'$  given in ED; continue with '750mg'$  IV Q12H. Goal AUC 400-550.  Expected AUC 415. Levaquin '750mg'$  IV Q24H.  Height: '5\' 2"'$  (157.5 cm) Weight: 123.8 kg (272 lb 14.9 oz) IBW/kg (Calculated) : 54.6  Temp (24hrs), Avg:99.5 F (37.5 C), Min:98.7 F (37.1 C), Max:100.2 F (37.9 C)  Recent Labs  Lab 05/26/22 0046  WBC 14.5*  CREATININE 1.21  LATICACIDVEN 1.5    Estimated Creatinine Clearance: 98.2 mL/min (by C-G formula based on SCr of 1.21 mg/dL).    Allergies  Allergen Reactions   Cefaclor Anaphylaxis and Shortness Of Breath   Penicillins Anaphylaxis and Rash    Has patient had a PCN reaction causing immediate rash, facial/tongue/throat swelling, SOB or lightheadedness with hypotension: yes Has patient had a PCN reaction causing severe rash involving mucus membranes or skin necrosis: no Has patient had a PCN reaction that required hospitalization: no Has patient had a PCN reaction occurring within the last 10 years: no If all of the above answers are "NO", then may proceed with Cephalosporin use.    Clindamycin Rash   Clindamycin/Lincomycin Rash    Thank you for allowing pharmacy to be a part of this patient's care.  Wynona Neat, PharmD, BCPS  05/26/2022 6:39 AM

## 2022-05-26 NOTE — H&P (Cosign Needed Addendum)
NAME:  Sergio [name], MRN:  [id number], DOB:  07/06/1985, LOS: 0 ADMISSION DATE:  05/26/2022 CONSULTATION DATE:  05/26/2022 REFERRING MD:  Sedonia Small - EDP CHIEF COMPLAINT:  Respiratory distress, large R pleural effusion   History of Present Illness:  37 year old man who presented to [facility] 1/21 for SOB and fever. Received neb x 2 without relief. Evaluated at [facility], found to have large R pleural effusion and transferred to [facility] ED for higher level of care. PMHx significant for Down syndrome, OSA, GERD with dysphagia, hydrocephalus (s/p VP shunt placement), pituitary adenoma, schizoaffective disorder, dementia/memory impairment.  History is obtained from chart and from patient's father, at bedside. Patient's father states that they were eating dinner the evening prior to admission when patient was noted to have audible wheezing. Appetite was down which was abnormal for patient. +Fever on temperature check. They administered a nebulizer treatment without any improvement. Patient's father states that he has had breathing issues in the remote past, but they thought he had "grown out of it" since it had been several years since patient had any issues with his breathing. Presented to [facility] for evaluation.  On evaluation at [facility], patient was febrile to 100.9F, mildly tachycardic, normotensive. SpO2 initially 87%, improved with supplemental O2. Labs notable for WBC 14.5, Hgb/Plt WNL, Na 130, K 3.6, Cr 1.21 (near baseline), normal LFTs. COVID/Flu/RSV negative. CXR with large R pleural effusion and compressive atelectasis with collapse of RML/RLL. Broad-spectrum Levaquin/Vanc started. Decision made to transfer patient to [facility] ED for higher level of care/additional pulmonary and anesthesia services, should respiratory status decline.   PCCM consulted for ICU admission and intervention.  Pertinent Medical History:   Past Medical History:  Diagnosis Date   Dementia ([place])    Difficulty swallowing    Down's syndrome     Dysphagia causing pulmonary aspiration with swallowing    GERD (gastroesophageal reflux disease)    Headache(784.0)    Hydrocephalus ([facility])    Insomnia    Memory loss    OSA (obstructive sleep apnea)    Pituitary adenoma ([place])    Schizoaffective disorder    Weakness    Significant Hospital Events: Including procedures, antibiotic start and stop dates in addition to other pertinent events   1/21 - Presented to [facility] for SOB, fever. CXR with large R pleural effusion, compressive atelectasis. Broad-spectrum antibiotics started. Nebs administered and placed on HFNC with improvement in sats. Transferred to [facility] for higher level of care (additional pulmonary/anesthesia resources). Bedside POCUS with large R fluid pocket noted on R posterior aspect of thorax.  Interim History / Subjective:  PCCM consulted for respiratory distress and large R pleural effusion with compressive atelectasis  Objective:  Blood pressure 110/66, pulse 71, temperature 98.7 F (37.1 C), temperature source Oral, resp. rate 16, height '5\' 2"'$  (1.575 m), weight 123.8 kg, SpO2 97 %.    FiO2 (%):  [50 %] 50 %   Intake/Output Summary (Last 24 hours) at 05/26/2022 0521 Last data filed at 05/26/2022 0505 Gross per 24 hour  Intake 619.79 ml  Output --  Net 619.79 ml   Filed Weights   05/26/22 0026  Weight: 123.8 kg   Physical Examination: General: Acutely ill-appearing young man in NAD. Pleasant affect. HEENT: [place]/AT, anicteric sclera, PERRL, moist mucous membranes. Down facies. Neuro:  Awake, alert. Answering questions appropriately in short phrases.  Responds to verbal stimuli. Following commands consistently. Moves all 4 extremities spontaneously. CV: RRR, no m/g/r. PULM: Breathing with mild tachypnea to high teens/low 20s,  and mildly labored on 15L [place]. Lung fields significantly diminished on R with little air movement. Fields CTA on L. GI: Soft, nontender, nondistended. Normoactive bowel sounds. Extremities:  Trace symmetric BLE edema noted. Skin: Warm/dry, no rashes.  Resolved Hospital Problem List:    Assessment & Plan:  Large R pleural effusion with compressive atelectasis Concern for PNA Presented to [facility] with SOB/fever. CXR with significant R-sided pleural effusion and associated compressive atelectasis with RML/RLL collapse. ?Parapneumonic effusion; difficult to assess if lobar/multifocal PNA present due to large amount of fluid present. - Admit to ICU for close monitoring - Thoracentesis versus chest tube placement discussed with patient/father; at this time thoracentesis seems most appropriate for symptomatic relief; no known process such as HF or malignancy to suggest that fluid reaccumulation would be an issue - Bedside POCUS with fluid pocket suitable for R-sided thora - F/u pleural fluid studies - Broad spectrum antibiotic coverage for now (Levaquin/Vanc); obtain MRSA PCR and narrow as able  - Follow CXR  Acute hypoxemic respiratory failure History of OSA - Continue supplemental O2 support; suspect needs will decrease significantly once pleural effusion is evacuated - Consider BiPAP/CPAP if needed - Wean O2 for sat > 90% - Bronchodilators PRN (DuoNebs) - Pulmonary hygiene  Schizoaffective disorder Dementia versus memory impairment - Resume home medications (Invega, trazodone, melatonin)   Down syndrome - Anticipate possibility of difficult airway if requiring intubation - Consider Echo  History of hydrocephalus s/p VP shunt - Noted   Best Practice: (right click and "Reselect all SmartList Selections" daily)   Diet/type: NPO w/ oral meds DVT prophylaxis: SCDs GI prophylaxis: N/A Lines: N/A Foley:  N/A Code Status:  full code Last date of multidisciplinary goals of care discussion [1/21 - Discussed code status with father at bedside in ED (patient has a previous DNR documented 2018); patient's father confirms FULL CODE status including intubation/MV and CPR if  necessary.]  Labs:  CBC: Recent Labs  Lab 05/26/22 0046 05/26/22 0145  WBC 14.5*  --   NEUTROABS 12.2*  --   HGB 15.5 15.6  HCT 46.8 46.0  MCV 96.1  --   PLT 325  --    Basic Metabolic Panel: Recent Labs  Lab 05/26/22 0046 05/26/22 0145  NA 130* 132*  K 3.6 4.6  CL 98  --   CO2 24  --   GLUCOSE 183*  --   BUN 16  --   CREATININE 1.21  --   CALCIUM 8.0*  --    GFR: Estimated Creatinine Clearance: 98.2 mL/min (by C-G formula based on SCr of 1.21 mg/dL). Recent Labs  Lab 05/26/22 0046  WBC 14.5*  LATICACIDVEN 1.5   Liver Function Tests: Recent Labs  Lab 05/26/22 0046  AST 21  ALT 22  ALKPHOS 69  BILITOT 0.4  PROT 7.5  ALBUMIN 3.0*   No results for input(s): "LIPASE", "AMYLASE" in the last 168 hours. No results for input(s): "AMMONIA" in the last 168 hours.  ABG:    Component Value Date/Time   PHART 7.350 03/18/2010 0844   PCO2ART 46.3 (H) 03/18/2010 0844   PO2ART 57.0 (L) 03/18/2010 0844   HCO3 29.1 (H) 05/26/2022 0145   TCO2 30 05/26/2022 0145   O2SAT 94 05/26/2022 0145    Coagulation Profile: Recent Labs  Lab 05/26/22 0046  INR 1.1   Cardiac Enzymes: No results for input(s): "CKTOTAL", "CKMB", "CKMBINDEX", "TROPONINI" in the last 168 hours.  HbA1C: HB A1C (BAYER DCA - WAIVED)  Date/Time Value Ref Range Status  03/05/2021 04:19 PM 5.5 4.8 - 5.6 % Final    Comment:             Prediabetes: 5.7 - 6.4          Diabetes: >6.4          Glycemic control for adults with diabetes: <7.0    CBG: No results for input(s): "GLUCAP" in the last 168 hours.  Review of Systems:   Review of systems completed with pertinent positives/negatives outlined in above HPI.  Past Medical History:  He,  has a past medical history of Dementia ([place]), Difficulty swallowing, Down's syndrome, Dysphagia causing pulmonary aspiration with swallowing, GERD (gastroesophageal reflux disease), Headache(784.0), Hydrocephalus ([place]), Insomnia, Memory loss, OSA (obstructive  sleep apnea), Pituitary adenoma ([place]), Schizoaffective disorder, and Weakness.   Surgical History:   Past Surgical History:  Procedure Laterality Date   BRAIN SURGERY     CSF SHUNT     TYMPANOSTOMY TUBE PLACEMENT Bilateral    VENTRICULOPERITONEAL SHUNT      Social History:   reports that he has never smoked. He has never used smokeless tobacco. He reports that he does not drink alcohol and does not use drugs.   Family History:  His family history includes Emphysema in his maternal grandfather; Hypertension in his mother; Renal Disease in his father.   Allergies: Allergies  Allergen Reactions   Cefaclor Anaphylaxis and Shortness Of Breath   Penicillins Anaphylaxis and Rash    Has patient had a PCN reaction causing immediate rash, facial/tongue/throat swelling, SOB or lightheadedness with hypotension: yes Has patient had a PCN reaction causing severe rash involving mucus membranes or skin necrosis: no Has patient had a PCN reaction that required hospitalization: no Has patient had a PCN reaction occurring within the last 10 years: no If all of the above answers are "NO", then may proceed with Cephalosporin use.    Clindamycin Rash   Clindamycin/Lincomycin Rash    Home Medications: Prior to Admission medications   Medication Sig Start Date End Date Taking? Authorizing Provider  ipratropium-albuterol (DUONEB) 0.5-2.5 (3) MG/3ML SOLN NEBULIZE 1 VIAL EVERY 6 HOURS AS NEEDED FOR WHEEZING OR SHORTNESS OF BREATH Patient taking differently: Take 3 mLs by nebulization every 6 (six) hours as needed (for shortness of breath or wheezing). 04/27/21  Yes Gottschalk, Leatrice Jewels M, DO  ketoconazole (NIZORAL) 2 % cream Apply topically daily as needed for irritation (rash in groin). X14 days per flare until cleared Patient taking differently: Apply 1 Application topically daily. X14 days per flare until cleared 03/05/21  Yes Gottschalk, Ashly M, DO  ketoconazole (NIZORAL) 2 % shampoo APPLY TOPICALLY 2  TIMES A WEEK Patient taking differently: Apply 1 Application topically every other day. 01/11/22  Yes Gottschalk, Leatrice Jewels M, DO  Melatonin 10 MG TABS Take 10 mg by mouth at bedtime.    Yes [provider]  metroNIDAZOLE (METROCREAM) 0.75 % cream APPLY TO THE AFFECTED AREA(S) TWICE DAILY FOR ROSACEA OF FACE 07/30/21  Yes Gottschalk, Ashly M, DO  paliperidone (INVEGA) 6 MG 24 hr tablet Take 1 tablet (6 mg total) by mouth at bedtime. 03/26/22  Yes Pashayan, Redgie Grayer, MD  traZODone (DESYREL) 100 MG tablet Take 2 tablets (200 mg total) by mouth at bedtime. 03/26/22  Yes Pashayan, Redgie Grayer, MD  Vitamin D, Ergocalciferol, (DRISDOL) 1.25 MG (50000 UNIT) CAPS capsule Take 1 capsule (50,000 Units total) by mouth every 7 (seven) days. Patient taking differently: Take 50,000 Units by mouth every 7 (seven) days. Wednesday 03/06/21  Yes Ronnie Doss M, DO  hydrOXYzine (VISTARIL) 50 MG capsule Take 1 capsule (50 mg total) by mouth 3 (three) times daily as needed. Patient not taking: Reported on 05/26/2022 03/26/22   Briant Cedar, MD    Critical care time: 35 minutes   Lestine Mount, [place] [facility] 05/26/22 5:21 AM  Please see [email or web address] for pager details.  From 7A-7P if no response, please call [phone] After hours, please call ELink [phone]

## 2022-05-26 NOTE — ED Notes (Signed)
Report given to [facility].

## 2022-05-26 NOTE — Progress Notes (Addendum)
Only about 500cc draining from tube, still light yellow with proteinaceous debris intermittently.  Discussed my recommendation for tPA and dornase through his chest tube to help break down additional fluid.  I discussed the bleeding risk with his mother and sister at bedside.  I have previously discussed this with his father as well.  His mom agrees to proceed.  tPA dornase ordered, will administer when available.  His sister mentioned that he complained of right leg pain last week.  Will order right lower extremity Doppler.  Julian Hy, DO 05/26/22 3:01 PM [facility]

## 2022-05-26 NOTE — ED Triage Notes (Signed)
Pt arrives to ED via [place] for SOB and plural effusion. Pt is on hi-flow Pt with hx of asthma

## 2022-05-26 NOTE — ED Notes (Signed)
Report called to Gerald Stabs, RN, ED charge RN at [facility].

## 2022-05-26 NOTE — Procedures (Signed)
Pleural Fibrinolytic Administration Procedure Note  Sergio [name]  [id number]  [birth year]  Date:05/26/22  Time:4:10 PM   Provider Performing:Brynlei Klausner Rodman Pickle, MD  Procedure: Pleural Fibrinolysis Initial day [phone])  Indication(s) Fibrinolysis of complicated pleural effusion  Consent Risks of the procedure as well as the alternatives and risks of each were explained to the patient and/or caregiver.  Consent for the procedure was obtained.   Anesthesia None   Time Out Verified patient identification, verified procedure, site/side was marked, verified correct patient position, special equipment/implants available, medications/allergies/relevant history reviewed, required imaging and test results available.   Sterile Technique Hand hygiene, gloves   Procedure Description Existing pleural catheter was cleaned and accessed in sterile manner.  '10mg'$  of tPA in 30cc of saline and '5mg'$  of dornase in 30cc of sterile water were injected into pleural space using existing pleural catheter.  Catheter will be clamped for 1 hour and then placed back to suction.   Complications/Tolerance None; patient tolerated the procedure well.   EBL None   Specimen(s) None

## 2022-05-26 NOTE — Progress Notes (Addendum)
Pleural catheter unclamped at 1656, 1 hour after tPA was injected.

## 2022-05-26 NOTE — ED Triage Notes (Signed)
Pt presents with caregiver. Pt with history of down syndrome and long history of respiratory problems. Tonight has had fever of 100.4, tylenol dose at 2330. Has received 2 breathing treatments this evening with no improvement. Audible wheezing at triage.

## 2022-05-26 NOTE — ED Provider Notes (Signed)
[facility] DEPT [facility] Provider Note: Georgena Spurling, MD, FACEP  CSN: [id number] MRN: [id number] ARRIVAL: 05/26/22 at [place]: [place]  Respiratory Distress  Level 5 caveat: Down syndrome HISTORY OF PRESENT ILLNESS  05/26/22 12:44 AM Sergio [name] is a 37 y.o. male with Down's syndrome and a history of pneumonia.  His father brought him here after he suddenly developed difficulty breathing over the last several hours.  On arrival his oxygen saturation was noted to be 87% and he was tachypneic 28.  He had a low-grade fever of 100.2.  He was immediately started on albuterol/ipratropium neb by respiratory therapy.  His oxygen saturation improved with supplemental oxygen.  He has had no known sick contacts recently.   Past Medical History:  Diagnosis Date   Dementia ([place])    Difficulty swallowing    Down's syndrome    Dysphagia causing pulmonary aspiration with swallowing    GERD (gastroesophageal reflux disease)    Headache(784.0)    Hydrocephalus (HCC)    Insomnia    Memory loss    OSA (obstructive sleep apnea)    Pituitary adenoma (HCC)    Schizoaffective disorder    Weakness     Past Surgical History:  Procedure Laterality Date   BRAIN SURGERY     CSF SHUNT     TYMPANOSTOMY TUBE PLACEMENT Bilateral    VENTRICULOPERITONEAL SHUNT      Family History  Problem Relation Age of Onset   Hypertension Mother    Renal Disease Father    Emphysema Maternal Grandfather        Died at 27    Social History   Tobacco Use   Smoking status: Never   Smokeless tobacco: Never  Vaping Use   Vaping Use: Never used  Substance Use Topics   Alcohol use: No   Drug use: No    Prior to Admission medications   Medication Sig Start Date End Date Taking? Authorizing Provider  hydrOXYzine (VISTARIL) 50 MG capsule Take 1 capsule (50 mg total) by mouth 3 (three) times daily as needed. 03/26/22   Briant Cedar, MD  ipratropium-albuterol (DUONEB)  0.5-2.5 (3) MG/3ML SOLN NEBULIZE 1 VIAL EVERY 6 HOURS AS NEEDED FOR WHEEZING OR SHORTNESS OF BREATH 04/27/21   Ronnie Doss M, DO  ketoconazole (NIZORAL) 2 % cream Apply topically daily as needed for irritation (rash in groin). X14 days per flare until cleared 03/05/21   Ronnie Doss M, DO  ketoconazole (NIZORAL) 2 % shampoo APPLY TOPICALLY 2 TIMES A WEEK 01/11/22   Ronnie Doss M, DO  Melatonin 10 MG TABS Take 10 mg by mouth at bedtime.     [provider]  metroNIDAZOLE (METROCREAM) 0.75 % cream APPLY TO THE AFFECTED AREA(S) TWICE DAILY FOR ROSACEA OF FACE 07/30/21   Ronnie Doss M, DO  paliperidone (INVEGA) 6 MG 24 hr tablet Take 1 tablet (6 mg total) by mouth at bedtime. 03/26/22   Briant Cedar, MD  traZODone (DESYREL) 100 MG tablet Take 2 tablets (200 mg total) by mouth at bedtime. 03/26/22   Briant Cedar, MD  Vitamin D, Ergocalciferol, (DRISDOL) 1.25 MG (50000 UNIT) CAPS capsule Take 1 capsule (50,000 Units total) by mouth every 7 (seven) days. 03/06/21   Janora Norlander, DO    Allergies Cefaclor, Penicillins, Clindamycin, and Clindamycin/lincomycin   REVIEW OF SYSTEMS  Level 5 caveat   PHYSICAL EXAMINATION  Initial Vital Signs Blood pressure 116/76, pulse (!) 104, temperature 100.2 F (  37.9 C), temperature source Oral, resp. rate (!) 28, height '5\' 2"'$  (1.575 m), weight 123.8 kg, SpO2 (!) 87 %.  Examination General: Well-developed, well-nourished male in mild distress; appearance consistent with age of record HENT: Down's facies; atraumatic Eyes: Normal appearance Neck: supple Heart: regular rate and rhythm; tachycardia Lungs: Tachypnea; stridor; decreased air movement bilaterally Abdomen: soft; nondistended; nontender; bowel sounds present Extremities: No deformity; full range of motion; pulses normal Neurologic: Awake, alert; motor function intact in all extremities and symmetric; no facial droop Skin: Warm and dry Psychiatric:  Flat affect   RESULTS  Summary of this visit's results, reviewed and interpreted by myself:   EKG Interpretation  Date/Time:  Sunday May 26 2022 00:52:55 EST Ventricular Rate:  101 PR Interval:  146 QRS Duration: 106 QT Interval:  335 QTC Calculation: 435 R Axis:   128 Text Interpretation: Sinus tachycardia Low voltage, precordial leads Probable RVH w/ secondary repol abnormality Borderline ST elevation, lateral leads Confirmed by Samah Lapiana, Jenny Reichmann [phone]) on 05/26/2022 1:01:21 AM       Laboratory Studies: Results for orders placed or performed during the hospital encounter of 05/26/22 (from the past 24 hour(s))  Resp panel by RT-PCR (RSV, Flu A&B, Covid) Peripheral     Status: None   Collection Time: 05/26/22 12:40 AM   Specimen: Peripheral; Nasal Swab  Result Value Ref Range   SARS Coronavirus 2 by RT PCR NEGATIVE NEGATIVE   Influenza A by PCR NEGATIVE NEGATIVE   Influenza B by PCR NEGATIVE NEGATIVE   Resp Syncytial Virus by PCR NEGATIVE NEGATIVE  Comprehensive metabolic panel     Status: Abnormal   Collection Time: 05/26/22 12:46 AM  Result Value Ref Range   Sodium 130 (L) 135 - 145 mmol/L   Potassium 3.6 3.5 - 5.1 mmol/L   Chloride 98 98 - 111 mmol/L   CO2 24 22 - 32 mmol/L   Glucose, Bld 183 (H) 70 - 99 mg/dL   BUN 16 6 - 20 mg/dL   Creatinine, Ser 1.21 0.61 - 1.24 mg/dL   Calcium 8.0 (L) 8.9 - 10.3 mg/dL   Total Protein 7.5 6.5 - 8.1 g/dL   Albumin 3.0 (L) 3.5 - 5.0 g/dL   AST 21 15 - 41 U/L   ALT 22 0 - 44 U/L   Alkaline Phosphatase 69 38 - 126 U/L   Total Bilirubin 0.4 0.3 - 1.2 mg/dL   GFR, Estimated >60 >60 mL/min   Anion gap 8 5 - 15  Lactic acid, plasma     Status: None   Collection Time: 05/26/22 12:46 AM  Result Value Ref Range   Lactic Acid, Venous 1.5 0.5 - 1.9 mmol/L  CBC with Differential     Status: Abnormal   Collection Time: 05/26/22 12:46 AM  Result Value Ref Range   WBC 14.5 (H) 4.0 - 10.5 K/uL   RBC 4.87 4.22 - 5.81 MIL/uL   Hemoglobin  15.5 13.0 - 17.0 g/dL   HCT 46.8 39.0 - 52.0 %   MCV 96.1 80.0 - 100.0 fL   MCH 31.8 26.0 - 34.0 pg   MCHC 33.1 30.0 - 36.0 g/dL   RDW 14.2 11.5 - 15.5 %   Platelets 325 150 - 400 K/uL   nRBC 0.0 0.0 - 0.2 %   Neutrophils Relative % 85 %   Neutro Abs 12.2 (H) 1.7 - 7.7 K/uL   Lymphocytes Relative 9 %   Lymphs Abs 1.3 0.7 - 4.0 K/uL   Monocytes Relative 5 %  Monocytes Absolute 0.7 0.1 - 1.0 K/uL   Eosinophils Relative 0 %   Eosinophils Absolute 0.0 0.0 - 0.5 K/uL   Basophils Relative 1 %   Basophils Absolute 0.1 0.0 - 0.1 K/uL   Immature Granulocytes 0 %   Abs Immature Granulocytes 0.06 0.00 - 0.07 K/uL  Protime-INR     Status: None   Collection Time: 05/26/22 12:46 AM  Result Value Ref Range   Prothrombin Time 14.5 11.4 - 15.2 seconds   INR 1.1 0.8 - 1.2  I-Stat venous blood gas, ([facility], [facility], [facility])     Status: Abnormal   Collection Time: 05/26/22  1:45 AM  Result Value Ref Range   pH, Ven 7.413 7.25 - 7.43   pCO2, Ven 45.6 44 - 60 mmHg   pO2, Ven 69 (H) 32 - 45 mmHg   Bicarbonate 29.1 (H) 20.0 - 28.0 mmol/L   TCO2 30 22 - 32 mmol/L   O2 Saturation 94 %   Acid-Base Excess 4.0 (H) 0.0 - 2.0 mmol/L   Sodium 132 (L) 135 - 145 mmol/L   Potassium 4.6 3.5 - 5.1 mmol/L   Calcium, Ion 1.10 (L) 1.15 - 1.40 mmol/L   HCT 46.0 39.0 - 52.0 %   Hemoglobin 15.6 13.0 - 17.0 g/dL   Patient temperature 98.7 F    Sample type VENOUS    Imaging Studies: DG Chest Port 1 View  Result Date: 05/26/2022 CLINICAL DATA:  Dyspnea, fever, wheezing EXAM: PORTABLE CHEST 1 VIEW COMPARISON:  05/22/2017 FINDINGS: Retained ventricular shunt catheter fragment noted overlying the right neck base and right apex. Large right pleural effusion has developed with compressive atelectasis of the right upper lobe and collapse of the right middle and lower lobes. No pneumothorax. Left lung is clear. No pleural effusion on the left. Cardiac size within normal limits. No acute bone abnormality. IMPRESSION: 1. Large  right pleural effusion with compressive atelectasis of the right upper lobe and collapse of the right middle and lower lobes. Electronically Signed   By: Fidela Salisbury M.D.   On: 05/26/2022 01:09    ED COURSE and MDM  Nursing notes, initial and subsequent vitals signs, including pulse oximetry, reviewed and interpreted by myself.  Vitals:   05/26/22 0230 05/26/22 0245 05/26/22 0250 05/26/22 0310  BP: 125/74 106/81 123/79 120/81  Pulse: 89 89 89 88  Resp: (!) 26 (!) 24 (!) 25 (!) 22  Temp:      TempSrc:      SpO2: 95% 95% 94% 94%  Weight:      Height:       Medications  ipratropium-albuterol (DUONEB) 0.5-2.5 (3) MG/3ML nebulizer solution 3 mL (3 mLs Nebulization Given 05/26/22 0131)  vancomycin (VANCOCIN) IVPB 1000 mg/200 mL premix (1,000 mg Intravenous New Bag/Given 05/26/22 0257)  albuterol (PROVENTIL) (2.5 MG/3ML) 0.083% nebulizer solution 2.5 mg (2.5 mg Nebulization Given 05/26/22 0131)  methylPREDNISolone sodium succinate (SOLU-MEDROL) 125 mg/2 mL injection (125 mg  Given 05/26/22 0053)  magnesium sulfate 2 GM/50ML IVPB (0 g  Stopped 05/26/22 0141)  levofloxacin (LEVAQUIN) IVPB 750 mg (0 mg Intravenous Stopped 05/26/22 0251)  lactated ringers bolus 1,000 mL (0 mLs Intravenous Stopped 05/26/22 0300)   1:00 AM Patient's chest x-ray shows a nearly completely whited out right lung field consistent with a multi lobar pneumonia.  Will go ahead and start antibiotics.  Levaquin and vancomycin chosen due to patient's documented penicillin and cephalosporin allergy.  1:26 AM Chest x-ray read as containing a large right effusion with  compression of the right middle and upper lobes.   1:34 AM Discussed with Dr. Sedonia Small, EDP at [facility].  He agrees that the patient would be better served being at [facility], ED where anesthesia is available should the patient require intubation (Down syndrome patients often have difficult airway anatomy) or chest tube (pulmonary critical care and ICU are available at  [facility]).  He accepts the patient for transfer to the [facility] ED.  3:28 AM [facility] at bedside ready for transport.  Patient in no respiratory distress.  He is more alert and is able to answer sentences.  Transient mild hypotension improved with IV fluid bolus.  PROCEDURES  Procedures CRITICAL CARE Performed by: Karen Chafe Nial Hawe Total critical care time: 45 minutes Critical care time was exclusive of separately billable procedures and treating other patients. Critical care was necessary to treat or prevent imminent or life-threatening deterioration. Critical care was time spent personally by me on the following activities: development of treatment plan with patient and/or surrogate as well as nursing, discussions with consultants, evaluation of patient's response to treatment, examination of patient, obtaining history from patient or surrogate, ordering and performing treatments and interventions, ordering and review of laboratory studies, ordering and review of radiographic studies, pulse oximetry and re-evaluation of patient's condition.   ED DIAGNOSES     ICD-10-CM   1. Acute respiratory failure with hypoxia (HCC)  J96.01     2. Pleural effusion on right  J90     3. Hyponatremia  E87.1          Karleen Seebeck, Jenny Reichmann, MD 05/26/22 [phone]

## 2022-05-26 NOTE — Procedures (Signed)
Insertion of Chest Tube Procedure Note  Sergio [name]  [id number]  01-01-1986  Date:05/26/22  Time:9:56 AM    Provider Performing: Julian Hy   Procedure: Thoracentesis, required Pleural Catheter Insertion w/ Imaging Guidance [id number]) due to poor drainage due to loculated fluid  Indication(s) Effusion  Consent Risks of the procedure as well as the alternatives and risks of each were explained to the patient and/or caregiver.  Consent for the procedure was obtained and is signed in the bedside chart. Sergio [name] provided consent.   Anesthesia Topical only with 1% lidocaine    Time Out Verified patient identification, verified procedure, site/side was marked, verified correct patient position, special equipment/implants available, medications/allergies/relevant history reviewed, required imaging and test results available.   Sterile Technique Maximal sterile technique including full sterile barrier drape, hand hygiene, sterile gown, sterile gloves, mask, hair covering, sterile ultrasound probe cover (if used).   Procedure Description Ultrasound used to identify appropriate pleural anatomy for placement and overlying skin marked. Septations were noted in the fluid prior to attempting thora and need for possibel chest tube was discussed and consented.  Area of placement cleaned and draped in sterile fashion. A thoracentesis was attempted, but drainage was poor. Using the thora catheter, a guidewire was placed to exchange the catheter for a pigtail chest tube . A 14 French pigtail pleural catheter was placed into the right pleural space using Seldinger technique. Appropriate return of fluid was obtained.  The tube was connected to atrium and placed on -20 cm H2O wall suction.   Complications/Tolerance None; patient tolerated the procedure well. Chest X-ray is ordered to verify placement.   EBL Minimal  Specimen(s) fluid- culture, cell count with diff, LDH, protein, glucose,  cytology  Julian Hy, DO 05/26/22 9:59 AM [name]

## 2022-05-26 NOTE — ED Provider Notes (Signed)
  Provider Note MRN:  [id number]  [place] date & time: 05/26/22    ED Course and Medical Decision Making  Assumed care from Dr. Florina Ou upon patient transfer.  History of here with hypoxic respiratory failure, large pleural effusion on chest x-ray.  Currently on 15 L high flow nasal cannula FiO2 of 50%, on arrival here at [facility] he is comfortable, no increased work of breathing.  Case discussed with Dr. Patsey Berthold of ICU, who will come to evaluate the patient for admission.  Currently no indication for emergent chest tube, ICU will consider thoracentesis.  .Critical Care  Performed by: Maudie Flakes, MD Authorized by: Maudie Flakes, MD   Critical care provider statement:    Critical care time (minutes):  30   Critical care was time spent personally by me on the following activities:  Development of treatment plan with patient or surrogate, discussions with consultants, evaluation of patient's response to treatment, examination of patient, ordering and review of laboratory studies, ordering and review of radiographic studies, ordering and performing treatments and interventions, pulse oximetry, re-evaluation of patient's condition and review of old charts   Final Clinical Impressions(s) / ED Diagnoses     ICD-10-CM   1. Acute respiratory failure with hypoxia (HCC)  J96.01     2. Pleural effusion on right  J90     3. Hyponatremia  E87.1       ED Discharge Orders     None       Discharge Instructions   None     Barth Kirks. Sedonia Small, [place] [email or web address]    Maudie Flakes, MD 05/26/22 [phone]

## 2022-05-26 NOTE — Progress Notes (Signed)
Seen and examined in the ED and later this morning in the ICU. Hadn't been feeling bad really, but slept more on Thurs 1/18 than normal and last night was wheezing, not responding to breathing treatments. He has some pain on his right side just superior to his nipple. Temp ~99 at home. No sick contacts.  Not on blood thinners or antiplatelets.  BP 105/66 (BP Location: Right Arm)   Pulse 75   Temp 97.9 F (36.6 C) (Axillary)   Resp (!) 22   Ht '5\' 2"'$  (1.575 m)   Wt 123.8 kg   SpO2 93%   BMI 49.92 kg/m  Young man sitting up in bed no acute distress [place]/AT, eyes anicteric Breathing comfortably on nasal cannula, wheezing when moving around.  No accessory muscle use.  On salter nasal cannula. S1-S2, regular rate and rhythm Abdomen obese, soft, nontender No peripheral edema, no cyanosis Awake, answering questions with short phrases, which dad reports is around his baseline.  Moving extremities. Skin warm, dry, no diffuse rashes  CXR personally reviewed-large right pleural effusion  Bedside ultrasound: Fluid with a few septations.  Appears dependent on ultrasound.  Labs reviewed.   Sodium 130 BUN 16 Creatinine 1.21 WBC 14.5 Platelets 325 INR 1.1. COVID, RSV, flu negative MRSA nares positive  A1c 5.5 in 2022  Assessment & plan:  Acute respiratory failure with hypoxia Large right pleural effusion Concern for right lower lobe community-acquired pneumonia, possible left lower lobe pneumonia -vanc, levofloxacin due to PCN allergy (anaphylaxis) -Attempted bedside Thora, unable to get adequate drainage, less than 100 cc despite attempting to manipulate the catheter in place.  Exchanged thoracentesis catheter for small bore pigtail chest tube with a guidewire.  Keep chest tube to suction - CXR to confirm chest tube - If inadequate drainage persists, will need intrapleural lytics. -check pneumococcal antigen -Neb PRN - Pulmonary hygiene - Wean supplemental oxygen as able to maintain  SpO2 greater than 90%.  Hyperglycemia - Check A1c - Sliding scale insulin as needed  Hyponatremia, potentially due to close effusion, acute chest infection - Check urine sodium  Hypocalcemia -repleted  This patient is critically ill with multiple organ system failure which requires frequent high complexity decision making, assessment, support, evaluation, and titration of therapies. This was completed through the application of advanced monitoring technologies and extensive interpretation of multiple databases. During this encounter critical care time was devoted to patient care services described in this note for 30 minutes.   Julian Hy, DO 05/26/22 10:15 AM [facility]  For contact information, see Amion. If no response to pager, please call PCCM consult pager. After hours, 7PM- 7AM, please call [place].

## 2022-05-26 NOTE — ED Notes (Signed)
ED TO INPATIENT HANDOFF REPORT  ED Nurse Name and Phone #:  Bonna Gains [phone] S Name/Age/Gender Sergio [name] 37 y.o. male Room/Bed: 034C/034C  Code Status   Code Status: Full Code  Home/SNF/Other Home Patient oriented to: self Is this baseline? Yes   Triage Complete: Triage complete  Chief Complaint Acute hypoxemic respiratory failure ([place]) [J96.01]  Triage Note Pt presents with caregiver. Pt with history of down syndrome and long history of respiratory problems. Tonight has had fever of 100.4, tylenol dose at 2330. Has received 2 breathing treatments this evening with no improvement. Audible wheezing at triage.   Pt arrives to ED via [place] for SOB and plural effusion. Pt is on hi-flow Pt with hx of asthma   Allergies Allergies  Allergen Reactions   Cefaclor Anaphylaxis and Shortness Of Breath   Penicillins Anaphylaxis and Rash    Has patient had a PCN reaction causing immediate rash, facial/tongue/throat swelling, SOB or lightheadedness with hypotension: yes Has patient had a PCN reaction causing severe rash involving mucus membranes or skin necrosis: no Has patient had a PCN reaction that required hospitalization: no Has patient had a PCN reaction occurring within the last 10 years: no If all of the above answers are "NO", then may proceed with Cephalosporin use.    Clindamycin Rash   Clindamycin/Lincomycin Rash    Level of Care/Admitting Diagnosis ED Disposition     ED Disposition  Admit   Condition  --   Comment  Hospital Area: [facility] [[id number]]  Level of Care: ICU [6]  May admit patient to [facility] or [facility] if equivalent level of care is available:: No  Covid Evaluation: Confirmed COVID Negative  Diagnosis: Acute hypoxemic respiratory failure [facility]) [[id number]]  Admitting Physician: Collier Bullock [[id number]]  Attending Physician: Collier Bullock [[id number]]  Certification:: I certify  this patient will need inpatient services for at least 2 midnights  Estimated Length of Stay: 3          B Medical/Surgery History Past Medical History:  Diagnosis Date   Dementia ([place])    Difficulty swallowing    Down's syndrome    Dysphagia causing pulmonary aspiration with swallowing    GERD (gastroesophageal reflux disease)    Headache(784.0)    Hydrocephalus (H[facility])    Insomnia    Memory loss    OSA (obstructive sleep apnea)    Pituitary adenoma ([place])    Schizoaffective disorder    Weakness    Past Surgical History:  Procedure Laterality Date   BRAIN SURGERY     CSF SHUNT     TYMPANOSTOMY TUBE PLACEMENT Bilateral    VENTRICULOPERITONEAL SHUNT       A IV Location/Drains/Wounds Patient Lines/Drains/Airways Status     Active Line/Drains/Airways     Name Placement date Placement time Site Days   Peripheral IV 05/26/22 22 G Posterior;Right Hand 05/26/22  0051  Hand  less than 1   Wound 07/06/11 Other (Comment) Thigh Right;Left scattered raised red areas to inner thighs 07/06/11  0300  Thigh  3977            Intake/Output Last 24 hours  Intake/Output Summary (Last 24 hours) at 05/26/2022 9373 Last data filed at 05/26/2022 0505 Gross per 24 hour  Intake 619.79 ml  Output --  Net 619.79 ml    Labs/Imaging Results for orders placed or performed during the hospital encounter of 05/26/22 (from the past 48 hour(s))  Resp panel by RT-PCR (RSV,  Flu A&B, Covid) Peripheral     Status: None   Collection Time: 05/26/22 12:40 AM   Specimen: Peripheral; Nasal Swab  Result Value Ref Range   SARS Coronavirus 2 by RT PCR NEGATIVE NEGATIVE    Comment: (NOTE) SARS-CoV-2 target nucleic acids are NOT DETECTED.  The SARS-CoV-2 RNA is generally detectable in upper respiratory specimens during the acute phase of infection. The lowest concentration of SARS-CoV-2 viral copies this assay can detect is 138 copies/mL. A negative result does not preclude SARS-Cov-2 infection  and should not be used as the sole basis for treatment or other patient management decisions. A negative result may occur with  improper specimen collection/handling, submission of specimen other than nasopharyngeal swab, presence of viral mutation(s) within the areas targeted by this assay, and inadequate number of viral copies(<138 copies/mL). A negative result must be combined with clinical observations, patient history, and epidemiological information. The expected result is Negative.  Fact Sheet for Patients:  EntrepreneurPulse.com.au  Fact Sheet for Healthcare Providers:  IncredibleEmployment.be  This test is no t yet approved or cleared by the [place] FDA and  has been authorized for detection and/or diagnosis of SARS-CoV-2 by FDA under an Emergency Use Authorization (EUA). This EUA will remain  in effect (meaning this test can be used) for the duration of the COVID-19 declaration under Section 564(b)(1) of the Act, 21 U.S.C.section 360bbb-3(b)(1), unless the authorization is terminated  or revoked sooner.       Influenza A by PCR NEGATIVE NEGATIVE   Influenza B by PCR NEGATIVE NEGATIVE    Comment: (NOTE) The Xpert Xpress SARS-CoV-2/FLU/RSV plus assay is intended as an aid in the diagnosis of influenza from Nasopharyngeal swab specimens and should not be used as a sole basis for treatment. Nasal washings and aspirates are unacceptable for Xpert Xpress SARS-CoV-2/FLU/RSV testing.  Fact Sheet for Patients: EntrepreneurPulse.com.au  Fact Sheet for Healthcare Providers: IncredibleEmployment.be  This test is not yet approved or cleared by the [place] FDA and has been authorized for detection and/or diagnosis of SARS-CoV-2 by FDA under an Emergency Use Authorization (EUA). This EUA will remain in effect (meaning this test can be used) for the duration of the COVID-19 declaration under  Section 564(b)(1) of the Act, 21 U.S.C. section 360bbb-3(b)(1), unless the authorization is terminated or revoked.     Resp Syncytial Virus by PCR NEGATIVE NEGATIVE    Comment: (NOTE) Fact Sheet for Patients: EntrepreneurPulse.com.au  Fact Sheet for Healthcare Providers: IncredibleEmployment.be  This test is not yet approved or cleared by the [place] FDA and has been authorized for detection and/or diagnosis of SARS-CoV-2 by FDA under an Emergency Use Authorization (EUA). This EUA will remain in effect (meaning this test can be used) for the duration of the COVID-19 declaration under Section 564(b)(1) of the Act, 21 U.S.C. section 360bbb-3(b)(1), unless the authorization is terminated or revoked.  Performed at [facility], [address] [place], [place] [address]   Comprehensive metabolic panel     Status: Abnormal   Collection Time: 05/26/22 12:46 AM  Result Value Ref Range   Sodium 130 (L) 135 - 145 mmol/L   Potassium 3.6 3.5 - 5.1 mmol/L   Chloride 98 98 - 111 mmol/L   CO2 24 22 - 32 mmol/L   Glucose, Bld 183 (H) 70 - 99 mg/dL    Comment: Glucose reference range applies only to samples taken after fasting for at least 8 hours.   BUN 16 6 - 20 mg/dL  Creatinine, Ser 1.21 0.61 - 1.24 mg/dL   Calcium 8.0 (L) 8.9 - 10.3 mg/dL   Total Protein 7.5 6.5 - 8.1 g/dL   Albumin 3.0 (L) 3.5 - 5.0 g/dL   AST 21 15 - 41 U/L   ALT 22 0 - 44 U/L   Alkaline Phosphatase 69 38 - 126 U/L   Total Bilirubin 0.4 0.3 - 1.2 mg/dL   GFR, Estimated >60 >60 mL/min    Comment: (NOTE) Calculated using the CKD-EPI Creatinine Equation (2021)    Anion gap 8 5 - 15    Comment: Performed at [facility], [address] [place], [place] [address]  Lactic acid, plasma     Status: None   Collection Time: 05/26/22 12:46 AM  Result Value Ref Range   Lactic Acid, Venous 1.5 0.5 - 1.9 mmol/L    Comment: Performed at [facility], [address] [place], [place] [address]  CBC with Differential     Status: Abnormal   Collection Time: 05/26/22 12:46 AM  Result Value Ref Range   WBC 14.5 (H) 4.0 - 10.5 K/uL   RBC 4.87 4.22 - 5.81 MIL/uL   Hemoglobin 15.5 13.0 - 17.0 g/dL   HCT 46.8 39.0 - 52.0 %   MCV 96.1 80.0 - 100.0 fL   MCH 31.8 26.0 - 34.0 pg   MCHC 33.1 30.0 - 36.0 g/dL   RDW 14.2 11.5 - 15.5 %   Platelets 325 150 - 400 K/uL   nRBC 0.0 0.0 - 0.2 %   Neutrophils Relative % 85 %   Neutro Abs 12.2 (H) 1.7 - 7.7 K/uL   Lymphocytes Relative 9 %   Lymphs Abs 1.3 0.7 - 4.0 K/uL   Monocytes Relative 5 %   Monocytes Absolute 0.7 0.1 - 1.0 K/uL   Eosinophils Relative 0 %   Eosinophils Absolute 0.0 0.0 - 0.5 K/uL   Basophils Relative 1 %   Basophils Absolute 0.1 0.0 - 0.1 K/uL   Immature Granulocytes 0 %   Abs Immature Granulocytes 0.06 0.00 - 0.07 K/uL    Comment: Performed at [facility], [address] [place], [place] [address]  Protime-INR     Status: None   Collection Time: 05/26/22 12:46 AM  Result Value Ref Range   Prothrombin Time 14.5 11.4 - 15.2 seconds   INR 1.1 0.8 - 1.2    Comment: (NOTE) INR goal varies based on device and disease states. Performed at [facility], [address] [place], [place] [address]   I-Stat venous blood gas, [facility], [facility], [facility])     Status: Abnormal   Collection Time: 05/26/22  1:45 AM  Result Value Ref Range   pH, Ven 7.413 7.25 - 7.43   pCO2, Ven 45.6 44 - 60 mmHg   pO2, Ven 69 (H) 32 - 45 mmHg   Bicarbonate 29.1 (H) 20.0 - 28.0 mmol/L   TCO2 30 22 - 32 mmol/L   O2 Saturation 94 %   Acid-Base Excess 4.0 (H) 0.0 - 2.0 mmol/L   Sodium 132 (L) 135 - 145 mmol/L   Potassium 4.6 3.5 - 5.1 mmol/L   Calcium, Ion 1.10 (L) 1.15 - 1.40 mmol/L   HCT 46.0 39.0 - 52.0 %   Hemoglobin 15.6 13.0 - 17.0 g/dL   Patient temperature 98.7 F    Sample type VENOUS    DG Chest Port 1 View  Result Date: 05/26/2022 CLINICAL  DATA:  Dyspnea, fever,  wheezing EXAM: PORTABLE CHEST 1 VIEW COMPARISON:  05/22/2017 FINDINGS: Retained ventricular shunt catheter fragment noted overlying the right neck base and right apex. Large right pleural effusion has developed with compressive atelectasis of the right upper lobe and collapse of the right middle and lower lobes. No pneumothorax. Left lung is clear. No pleural effusion on the left. Cardiac size within normal limits. No acute bone abnormality. IMPRESSION: 1. Large right pleural effusion with compressive atelectasis of the right upper lobe and collapse of the right middle and lower lobes. Electronically Signed   By: Fidela Salisbury M.D.   On: 05/26/2022 01:09    Pending Labs Unresulted Labs (From admission, onward)     Start     Ordered   05/27/22 0500  CBC  Tomorrow morning,   R        05/26/22 0606   05/27/22 6606  Basic metabolic panel  Tomorrow morning,   R        05/26/22 0606   05/27/22 0500  Magnesium  Tomorrow morning,   R        05/26/22 0606   05/27/22 0500  Phosphorus  Tomorrow morning,   R        05/26/22 0606   05/26/22 0606  Strep pneumoniae urinary antigen (not at [facility])  Once,   R        05/26/22 0606   05/26/22 0031  Culture, blood (Routine x 2)  BLOOD CULTURE X 2,   STAT      05/26/22 0031   05/26/22 0031  Urinalysis, Routine w reflex microscopic  Once,   URGENT        05/26/22 0031            Vitals/Pain Today's Vitals   05/26/22 0421 05/26/22 0500 05/26/22 0515 05/26/22 0530  BP:  107/70 110/66 110/64  Pulse:  74 71 82  Resp:  19 16 (!) 24  Temp:      TempSrc:      SpO2:  97% 97% 96%  Weight:      Height:      PainSc: 0-No pain       Isolation Precautions No active isolations  Medications Medications  docusate sodium (COLACE) capsule 100 mg (has no administration in time range)  polyethylene glycol (MIRALAX / GLYCOLAX) packet 17 g (has no administration in time range)  heparin injection 5,000 Units (has no administration in time range)   ipratropium-albuterol (DUONEB) 0.5-2.5 (3) MG/3ML nebulizer solution 3 mL (has no administration in time range)  paliperidone (INVEGA) 24 hr tablet 6 mg (has no administration in time range)  traZODone (DESYREL) tablet 200 mg (has no administration in time range)  melatonin tablet 10 mg (has no administration in time range)  albuterol (PROVENTIL) (2.5 MG/3ML) 0.083% nebulizer solution 2.5 mg (2.5 mg Nebulization Given 05/26/22 0131)  methylPREDNISolone sodium succinate (SOLU-MEDROL) 125 mg/2 mL injection (125 mg  Given 05/26/22 0053)  magnesium sulfate 2 GM/50ML IVPB (0 g  Stopped 05/26/22 0141)  levofloxacin (LEVAQUIN) IVPB 750 mg (0 mg Intravenous Stopped 05/26/22 0251)  vancomycin (VANCOCIN) IVPB 1000 mg/200 mL premix (0 mg Intravenous Stopped 05/26/22 0505)  lactated ringers bolus 1,000 mL (0 mLs Intravenous Stopped 05/26/22 0300)    Mobility walks     Focused Assessments Pulmonary Assessment Handoff:  Lung sounds: L Breath Sounds: Diminished, Expiratory wheezes R Breath Sounds: Other (Comment) (unable to hear breath sounds on right side) O2 Device: Other (Comment) (Salt vent) O2 Flow  Rate (L/min): 21 L/min    R Recommendations: See Admitting Provider Note  Report given to:   Additional Notes:   Pt. On HI-flow humidified air on 12L. Pt non-verbal. Hx Down Syndrome. Father with pt

## 2022-05-27 ENCOUNTER — Inpatient Hospital Stay (HOSPITAL_COMMUNITY): Payer: Medicare Other

## 2022-05-27 DIAGNOSIS — R52 Pain, unspecified: Secondary | ICD-10-CM | POA: Diagnosis not present

## 2022-05-27 DIAGNOSIS — J9601 Acute respiratory failure with hypoxia: Secondary | ICD-10-CM | POA: Diagnosis not present

## 2022-05-27 LAB — CBC
HCT: 41.8 % (ref 39.0–52.0)
Hemoglobin: 13.8 g/dL (ref 13.0–17.0)
MCH: 32.3 pg (ref 26.0–34.0)
MCHC: 33 g/dL (ref 30.0–36.0)
MCV: 97.9 fL (ref 80.0–100.0)
Platelets: 277 10*3/uL (ref 150–400)
RBC: 4.27 MIL/uL (ref 4.22–5.81)
RDW: 14.1 % (ref 11.5–15.5)
WBC: 22.5 10*3/uL — ABNORMAL HIGH (ref 4.0–10.5)
nRBC: 0 % (ref 0.0–0.2)

## 2022-05-27 LAB — BASIC METABOLIC PANEL
Anion gap: 5 (ref 5–15)
BUN: 9 mg/dL (ref 6–20)
CO2: 30 mmol/L (ref 22–32)
Calcium: 8.4 mg/dL — ABNORMAL LOW (ref 8.9–10.3)
Chloride: 101 mmol/L (ref 98–111)
Creatinine, Ser: 1.03 mg/dL (ref 0.61–1.24)
GFR, Estimated: >60 mL/min (ref >60)
Glucose, Bld: 134 mg/dL — ABNORMAL HIGH (ref 70–99)
Potassium: 4.8 mmol/L (ref 3.5–5.1)
Sodium: 136 mmol/L (ref 135–145)

## 2022-05-27 LAB — PROCALCITONIN: Procalcitonin: 0.22 ng/mL

## 2022-05-27 LAB — GLUCOSE, CAPILLARY
Glucose-Capillary: 128 mg/dL — ABNORMAL HIGH (ref 70–99)
Glucose-Capillary: 114 mg/dL — ABNORMAL HIGH (ref 70–99)
Glucose-Capillary: 107 mg/dL — ABNORMAL HIGH (ref 70–99)
Glucose-Capillary: 107 mg/dL — ABNORMAL HIGH (ref 70–99)
Glucose-Capillary: 114 mg/dL — ABNORMAL HIGH (ref 70–99)
Glucose-Capillary: 110 mg/dL — ABNORMAL HIGH (ref 70–99)

## 2022-05-27 LAB — PHOSPHORUS: Phosphorus: 3.9 mg/dL (ref 2.5–4.6)

## 2022-05-27 LAB — MAGNESIUM: Magnesium: 2.1 mg/dL (ref 1.7–2.4)

## 2022-05-27 LAB — HEMOGLOBIN A1C
Hgb A1c MFr Bld: 5.5 % (ref 4.8–5.6)
Mean Plasma Glucose: 111.15 mg/dL

## 2022-05-27 MED ORDER — SODIUM CHLORIDE 0.9% FLUSH
10.0000 mL | Freq: Three times a day (TID) | INTRAVENOUS | Status: DC
  Administered 2022-05-27: 10 mL via INTRAPLEURAL

## 2022-05-27 MED ORDER — VANCOMYCIN HCL IN DEXTROSE 1-5 GM/200ML-% IV SOLN
1000.0000 mg | Freq: Two times a day (BID) | INTRAVENOUS | Status: DC
  Administered 2022-05-27 – 2022-05-28 (×2): 1000 mg via INTRAVENOUS
  Filled 2022-05-27 (×2): qty 200

## 2022-05-27 MED ORDER — SODIUM CHLORIDE (PF) 0.9 % IJ SOLN
10.0000 mg | Freq: Once | INTRAMUSCULAR | Status: AC
  Administered 2022-05-27: 10 mg via INTRAPLEURAL
  Filled 2022-05-27: qty 10

## 2022-05-27 MED ORDER — STERILE WATER FOR INJECTION IJ SOLN
5.0000 mg | Freq: Once | RESPIRATORY_TRACT | Status: AC
  Administered 2022-05-27: 5 mg via INTRAPLEURAL
  Filled 2022-05-27: qty 5

## 2022-05-27 NOTE — Progress Notes (Signed)
NAME:  Sergio [name], MRN:  [id number], DOB:  02-Mar-1986, LOS: 1 ADMISSION DATE:  05/26/2022 CONSULTATION DATE:  05/26/2022 REFERRING MD:  Sedonia Small - EDP CHIEF COMPLAINT:  Respiratory distress, large R pleural effusion   History of Present Illness:  37 year old man who presented to [facility] 1/21 for SOB and fever. Received neb x 2 without relief. Evaluated at [facility], found to have large R pleural effusion and transferred to [facility] ED for higher level of care. PMHx significant for Down syndrome, OSA, GERD with dysphagia, hydrocephalus (s/p VP shunt placement), pituitary adenoma, schizoaffective disorder, dementia/memory impairment.  History is obtained from chart and from patient's father, at bedside. Patient's father states that they were eating dinner the evening prior to admission when patient was noted to have audible wheezing. Appetite was down which was abnormal for patient. +Fever on temperature check. They administered a nebulizer treatment without any improvement. Patient's father states that he has had breathing issues in the remote past, but they thought he had "grown out of it" since it had been several years since patient had any issues with his breathing. Presented to [facility] for [facility].  On evaluation at [facility], patient was febrile to 100.59F, mildly tachycardic, normotensive. SpO2 initially 87%, improved with supplemental O2. Labs notable for WBC 14.5, Hgb/Plt WNL, Na 130, K 3.6, Cr 1.21 (near baseline), normal LFTs. COVID/Flu/RSV negative. CXR with large R pleural effusion and compressive atelectasis with collapse of RML/RLL. Broad-spectrum Levaquin/Vanc started. Decision made to transfer patient to [facility] ED for higher level of care/additional pulmonary and anesthesia services, should respiratory status decline.   PCCM consulted for ICU admission and intervention.  Pertinent Medical History:   Past Medical History:  Diagnosis Date   Dementia ([place])    Difficulty swallowing    Down's syndrome     Dysphagia causing pulmonary aspiration with swallowing    GERD (gastroesophageal reflux disease)    Headache(784.0)    Hydrocephalus (H[facility])    Insomnia    Memory loss    OSA (obstructive sleep apnea)    Pituitary adenoma ([place])    Schizoaffective disorder    Weakness    Significant Hospital Events: Including procedures, antibiotic start and stop dates in addition to other pertinent events   1/21 - Presented to [facility] for SOB, fever. CXR with large R pleural effusion, compressive atelectasis. Broad-spectrum antibiotics started. Nebs administered and placed on HFNC with improvement in sats. Transferred to [facility] for higher level of care (additional pulmonary/anesthesia resources). Bedside POCUS with large R fluid pocket noted on R posterior aspect of thorax. Chest tube placed and 1st dose of pleural lytics given.  Interim History / Subjective:   2L of total output from chest tube since being placed Chest radiograph with minimal improvement in right effusion Spoke with patient's mother at bedside, permission given to give second dose of pleural lytics today  Objective:  Blood pressure 109/80, pulse 67, temperature 97.7 F (36.5 C), temperature source Oral, resp. rate 19, height '5\' 2"'$  (1.575 m), weight 123.8 kg, SpO2 95 %.        Intake/Output Summary (Last 24 hours) at 05/27/2022 7121 Last data filed at 05/26/2022 2200 Gross per 24 hour  Intake 469.54 ml  Output 2715 ml  Net -2245.46 ml   Filed Weights   05/26/22 0026  Weight: 123.8 kg   Physical Examination: General: acutely ill male with downs syndrome, no acute distress, sitting in chair HEENT: [place]/AT, anicteric sclera, PERRL, moist mucous membranes Neuro:  Awake, alert. Answering questions  appropriately in short phrases.  Responds to verbal stimuli. Following commands consistently. Moves all 4 extremities spontaneously. CV: RRR, no m/g/r. PULM: decreased breath sounds right base, no wheezing GI: Soft, nontender, nondistended.  Normoactive bowel sounds. Extremities: warm, no edema Skin: Warm/dry, no rashes.  Resolved Hospital Problem List:    Assessment & Plan:  Large R pleural effusion, exudative with compressive atelectasis Concern for PNA - Chest tube placed 1/21 - Pleural lytics given 1/21, plan for second dose today - f/u up pleural fluid cultures - Broad spectrum antibiotic coverage for now (Levaquin/Vanc); obtain MRSA PCR and narrow as able  - Follow CXR  Acute hypoxemic respiratory failure History of OSA - Continue supplemental O2 support; suspect needs will decrease significantly once pleural effusion is evacuated - Wean O2 for sat > 90% - Bronchodilators PRN (DuoNebs) - Pulmonary hygiene  Schizoaffective disorder Dementia versus memory impairment - Continue home medications (Invega, trazodone, melatonin)   History of hydrocephalus s/p VP shunt - Noted   Best Practice: (right click and "Reselect all SmartList Selections" daily)   Diet/type: Regular consistency (see orders) DVT prophylaxis: SCDs GI prophylaxis: N/A Lines: N/A Foley:  N/A Code Status:  full code Last date of multidisciplinary goals of care discussion [1/21 - Discussed code status with father at bedside in ED (patient has a previous DNR documented 2018); patient's father confirms FULL CODE status including intubation/MV and CPR if necessary.]  Labs:  CBC: Recent Labs  Lab 05/26/22 0046 05/26/22 0145 05/27/22 0039  WBC 14.5*  --  22.5*  NEUTROABS 12.2*  --   --   HGB 15.5 15.6 13.8  HCT 46.8 46.0 41.8  MCV 96.1  --  97.9  PLT 325  --  371   Basic Metabolic Panel: Recent Labs  Lab 05/26/22 0046 05/26/22 0145 05/27/22 0039  NA 130* 132* 136  K 3.6 4.6 4.8  CL 98  --  101  CO2 24  --  30  GLUCOSE 183*  --  134*  BUN 16  --  9  CREATININE 1.21  --  1.03  CALCIUM 8.0*  --  8.4*  MG  --   --  2.1  PHOS  --   --  3.9   GFR: Estimated Creatinine Clearance: 115.4 mL/min (by C-G formula based on SCr of 1.03  mg/dL). Recent Labs  Lab 05/26/22 0046 05/27/22 0039  PROCALCITON  --  0.22  WBC 14.5* 22.5*  LATICACIDVEN 1.5  --    Liver Function Tests: Recent Labs  Lab 05/26/22 0046  AST 21  ALT 22  ALKPHOS 69  BILITOT 0.4  PROT 7.5  ALBUMIN 3.0*   No results for input(s): "LIPASE", "AMYLASE" in the last 168 hours. No results for input(s): "AMMONIA" in the last 168 hours.  ABG:    Component Value Date/Time   PHART 7.350 03/18/2010 0844   PCO2ART 46.3 (H) 03/18/2010 0844   PO2ART 57.0 (L) 03/18/2010 0844   HCO3 29.1 (H) 05/26/2022 0145   TCO2 30 05/26/2022 0145   O2SAT 94 05/26/2022 0145    Coagulation Profile: Recent Labs  Lab 05/26/22 0046  INR 1.1   Cardiac Enzymes: No results for input(s): "CKTOTAL", "CKMB", "CKMBINDEX", "TROPONINI" in the last 168 hours.  HbA1C: HB A1C (BAYER DCA - WAIVED)  Date/Time Value Ref Range Status  03/05/2021 04:19 PM 5.5 4.8 - 5.6 % Final    Comment:             Prediabetes: 5.7 - 6.4  Diabetes: >6.4          Glycemic control for adults with diabetes: <7.0    Hgb A1c MFr Bld  Date/Time Value Ref Range Status  05/27/2022 12:39 AM 5.5 4.8 - 5.6 % Final    Comment:    (NOTE) Pre diabetes:          5.7%-6.4%  Diabetes:              >6.4%  Glycemic control for   <7.0% adults with diabetes    CBG: Recent Labs  Lab 05/26/22 1523 05/26/22 1934 05/26/22 2311 05/27/22 0320 05/27/22 0738  GLUCAP 148* 155* 144* 128* 114*      Critical care time: 35 minutes   Freddi Starr, MD [facility] 05/27/22 8:52 AM  Please see [email or web address] for pager details.  From 7A-7P if no response, please call [phone] After hours, please call ELink [phone]

## 2022-05-27 NOTE — Procedures (Signed)
Pleural Fibrinolytic Administration Procedure Note  Sergio [name]  [id number]  [birth year]  Date:05/27/22  Time:6:44 PM   Provider Performing:Larkyn Greenberger B Chezney Huether   Procedure: Pleural Fibrinolysis Subsequent day (56812)  Indication(s) Fibrinolysis of complicated pleural effusion  Consent Risks of the procedure as well as the alternatives and risks of each were explained to the patient and/or caregiver.  Consent for the procedure was obtained.   Anesthesia None   Time Out Verified patient identification, verified procedure, site/side was marked, verified correct patient position, special equipment/implants available, medications/allergies/relevant history reviewed, required imaging and test results available.   Sterile Technique Hand hygiene, gloves   Procedure Description Existing pleural catheter was cleaned and accessed in sterile manner.  '10mg'$  of tPA in 30cc of saline and '5mg'$  of dornase in 30cc of sterile water were injected into pleural space using existing pleural catheter.  Catheter will be clamped for 1 hour and then placed back to suction.   Complications/Tolerance None; patient tolerated the procedure well.  EBL None   Specimen(s) None

## 2022-05-27 NOTE — Progress Notes (Signed)
Lower extremity venous duplex has been completed.   Preliminary results in CV Proc.   Sergio [name] Sergio [name] 05/27/2022 2:22 PM

## 2022-05-28 ENCOUNTER — Inpatient Hospital Stay (HOSPITAL_COMMUNITY): Payer: Medicare Other

## 2022-05-28 DIAGNOSIS — J9601 Acute respiratory failure with hypoxia: Secondary | ICD-10-CM | POA: Diagnosis not present

## 2022-05-28 LAB — BASIC METABOLIC PANEL
Anion gap: 6 (ref 5–15)
BUN: 15 mg/dL (ref 6–20)
CO2: 32 mmol/L (ref 22–32)
Calcium: 8.3 mg/dL — ABNORMAL LOW (ref 8.9–10.3)
Chloride: 96 mmol/L — ABNORMAL LOW (ref 98–111)
Creatinine, Ser: 1.07 mg/dL (ref 0.61–1.24)
GFR, Estimated: >60 mL/min (ref >60)
Glucose, Bld: 129 mg/dL — ABNORMAL HIGH (ref 70–99)
Potassium: 4.5 mmol/L (ref 3.5–5.1)
Sodium: 134 mmol/L — ABNORMAL LOW (ref 135–145)

## 2022-05-28 LAB — CBC
HCT: 41 % (ref 39.0–52.0)
Hemoglobin: 13.3 g/dL (ref 13.0–17.0)
MCH: 32.1 pg (ref 26.0–34.0)
MCHC: 32.4 g/dL (ref 30.0–36.0)
MCV: 99 fL (ref 80.0–100.0)
Platelets: 257 10*3/uL (ref 150–400)
RBC: 4.14 MIL/uL — ABNORMAL LOW (ref 4.22–5.81)
RDW: 14.4 % (ref 11.5–15.5)
WBC: 10.7 10*3/uL — ABNORMAL HIGH (ref 4.0–10.5)
nRBC: 0 % (ref 0.0–0.2)

## 2022-05-28 LAB — GLUCOSE, CAPILLARY
Glucose-Capillary: 105 mg/dL — ABNORMAL HIGH (ref 70–99)
Glucose-Capillary: 106 mg/dL — ABNORMAL HIGH (ref 70–99)
Glucose-Capillary: 106 mg/dL — ABNORMAL HIGH (ref 70–99)
Glucose-Capillary: 126 mg/dL — ABNORMAL HIGH (ref 70–99)
Glucose-Capillary: 140 mg/dL — ABNORMAL HIGH (ref 70–99)
Glucose-Capillary: 99 mg/dL (ref 70–99)

## 2022-05-28 LAB — CYTOLOGY - NON PAP

## 2022-05-28 LAB — PROCALCITONIN: Procalcitonin: 0.11 ng/mL

## 2022-05-28 NOTE — TOC Initial Note (Addendum)
Transition of Care [facility]) - Initial/Assessment Note    Patient Details  Name: Sergio [name] MRN: [id number] Date of Birth: 1985-09-19  Transition [facility]) CM/SW Contact:    Tom-Johnson, Renea Ee, RN Phone Number: 05/28/2022, 2:14 PM  Clinical Narrative:                  CM spoke with patient and mother, Sergio [name] at bedside about needs for post hospital transition.  Patient admitted for Acute Hypoxemic Respiratory Failure, currently on 5L HFNC. Found to have large Rt Pleural Effusion, Compressive Atelectasis with Collapse of RML/RLL. Had Rt Chest Tube placement on 05/26/22. Currently on IV abx.  Patient has significant hx of Down Syndrome and disabled. Lives with both parents. Mother is primary caregiver. Has a ramp at home. Does not have DME's but ambulates with assists, mother states he holds unto someone d/t his floating knee cap which is part of his Down Syndrome. Currently on O2 but does not use home O2.  PCP is  Janora Norlander, DO and uses [employer].  No TOC needs or recommendations noted at this time. CM will continue to follow as patient progresses with care towards discharge.            Barriers to Discharge: Continued Medical Work up   Patient Goals and CMS Choice Patient states their goals for this hospitalization and ongoing recovery are:: To return home CMS Medicare.gov Compare Post Acute Care list provided to:: Patient Choice offered to / list presented to : Patient, Parent (Mother)      Expected Discharge Plan and Services   Discharge Planning Services: CM Consult   Living arrangements for the past 2 months: Single Family Home                                      Prior Living Arrangements/Services Living arrangements for the past 2 months: Single Family Home Lives with:: Parents Patient language and need for interpreter reviewed:: Yes Do you feel safe going back to the place where you live?: Yes      Need for Family  Participation in Patient Care: Yes (Comment) Care giver support system in place?: Yes (comment)   Criminal Activity/Legal Involvement Pertinent to Current Situation/Hospitalization: No - Comment as needed  Activities of Daily Living      Permission Sought/Granted Permission sought to share information with : Case Manager, Family Supports Permission granted to share information with : Yes, Verbal Permission Granted              Emotional Assessment Appearance:: Developmentally appropriate (Down Syndrome.) Attitude/Demeanor/Rapport: Engaged, Gracious Affect (typically observed): Accepting, Appropriate, Calm, Hopeful, Pleasant Orientation: : Oriented to Self, Oriented to Place, Oriented to Situation Alcohol / Substance Use: Not Applicable Psych Involvement: No (comment)  Admission diagnosis:  Hyponatremia [E87.1] Pleural effusion on right [J90] Acute respiratory failure with hypoxia (HCC) [J96.01] Acute hypoxemic respiratory failure (HCC) [J96.01] Patient Active Problem List   Diagnosis Date Noted   Acute hypoxemic respiratory failure ([place]) 05/26/2022   Need for management of chest tube 05/26/2022   Pleural effusion on right 05/26/2022   Acute respiratory failure with hypoxia ([place]) 05/26/2022   Asthma, chronic obstructive, with acute exacerbation ([place]) 08/05/2019   Incontinence 12/30/2017   Sleep arousal disorder 07/12/2015   Trisomy 21 12/08/2013   Congenital hydrocephalus ([place]) 12/08/2013   Tinea pedis 05/11/2013   Severe obesity (BMI >=  40) ([place]) 04/05/2013   OSA (obstructive sleep apnea)    Pituitary adenoma ([place])    Recurrent aspiration pneumonia ([place]) 09/23/2012   Hypersomnia with sleep apnea 09/23/2012   Ingrown nail 09/23/2012   Unspecified asthma(493.90) 09/23/2012   Adverse effect of anterior pituitary hormone 09/02/2012   Headache(784.0) 09/02/2012   Schizophrenia ([place]) 09/02/2012   Dementia due to medical condition with behavioral disturbance ([place])  09/02/2012   Shortness of breath 07/05/2012   Pneumonia 07/06/2011   Fever 07/06/2011   GERD (gastroesophageal reflux disease) 07/06/2011   PCP:  Janora Norlander, DO Pharmacy:   [facility], [address] [place] [address] Phone: [phone] Fax: [phone]     Social Determinants of Health (SDOH) Social History: SDOH Screenings   Food Insecurity: Food Insecurity Present (05/17/2021)  Housing: Low Risk  (05/17/2021)  Transportation Needs: No Transportation Needs (05/17/2021)  Alcohol Screen: Low Risk  (05/17/2021)  Depression (PHQ2-9): Low Risk  (05/17/2021)  Recent Concern: Depression (PHQ2-9) - Medium Risk (03/05/2021)  Financial Resource Strain: Medium Risk (05/17/2021)  Physical Activity: Inactive (05/17/2021)  Social Connections: Moderately Integrated (05/17/2021)  Stress: Stress Concern Present (05/17/2021)  Tobacco Use: Low Risk  (05/26/2022)   SDOH Interventions: Transportation Interventions: Intervention Not Indicated, Inpatient TOC, Patient Resources (Friends/Family)   Readmission Risk Interventions     No data to display

## 2022-05-28 NOTE — Progress Notes (Signed)
This RN attempted to call report multiple times to floor and was told that the nurse taking report was busy. Another attempt was made and asked to speak with charge RN. The charge RN also stated she was unable to take report at that time. On the 4th attempt to call report to the oncoming RN at [place], the floor stated they were again unable to take report stating they did not know what RN was going to be taking that patient. Report will be given to the oncoming RN in ICU and attempts will be made to call floor to give report to the accepting RN.

## 2022-05-28 NOTE — Evaluation (Signed)
Clinical/Bedside Swallow Evaluation Patient Details  Name: LE FAULCON III MRN: [id number] Date of Birth: 02/14/1986  Today's Date: 05/28/2022 Time: SLP Start Time (ACUTE ONLY): 90 SLP Stop Time (ACUTE ONLY): 1435 SLP Time Calculation (min) (ACUTE ONLY): 15 min  Past Medical History:  Past Medical History:  Diagnosis Date   Dementia ([place])    Difficulty swallowing    Down's syndrome    Dysphagia causing pulmonary aspiration with swallowing    GERD (gastroesophageal reflux disease)    Headache(784.0)    Hydrocephalus (HCC)    Insomnia    Memory loss    OSA (obstructive sleep apnea)    Pituitary adenoma ([place])    Schizoaffective disorder    Weakness    Past Surgical History:  Past Surgical History:  Procedure Laterality Date   BRAIN SURGERY     CSF SHUNT     TYMPANOSTOMY TUBE PLACEMENT Bilateral    VENTRICULOPERITONEAL SHUNT     HPI:  Patient is a 37 y.o. male with PMH: Down's Syndrome, GERD, dysphagia, insomnia, memory loss, dementia, OSA, schizoaffective disorder, pituitary adenoma. He presented to [place] on 05/26/2022 from home with SOB and fever and found to have large right pleural effusion and was transferred to [facility] for higher level of care.    Assessment / Plan / Recommendation  Clinical Impression  Patient is not currently presenting with clinical s/s of dysphagia as per this bedside swallow evaluation. He does have a h/o dysphagia however this was remote with MBS completed in 2014 and no documented SLP intervention since then. During this evaluation, patient's mother and his sister were both present and as they provide care for him at home, were both very knowledgeable about patient's current and past functioning. Mom told SLP that after finding out the importance of having patient take his pills in puree (she uses yogurt), he hasnt had any other difficulties. SLP observed patient with PO intake of solids and liquids with his sister feeding him. No observed  impairments in oral phase and no overt s/s aspiration or penetration; swallow initiation appeared timely. SLP recommending to continue with current PO diet and advised family to request SLP return if any future concerns for dysphagia. SLP Visit Diagnosis: Dysphagia, unspecified (R13.10)    Aspiration Risk  Mild aspiration risk;No limitations    Diet Recommendation Regular;Thin liquid   Liquid Administration via: Cup;Straw Medication Administration: Whole meds with puree Supervision: Full supervision/cueing for compensatory strategies;Comment (family can assist with PO's) Compensations: Slow rate;Small sips/bites Postural Changes: Seated upright at 90 degrees    Other  Recommendations Oral Care Recommendations: Oral care BID    Recommendations for follow up therapy are one component of a multi-disciplinary discharge planning process, led by the attending physician.  Recommendations may be updated based on patient status, additional functional criteria and insurance authorization.  Follow up Recommendations No SLP follow up      Assistance Recommended at Discharge    Functional Status Assessment Patient has not had a recent decline in their functional status  Frequency and Duration   N/A         Prognosis   N/A     Swallow Study   General Date of Onset: 05/26/22 HPI: Patient is a 37 y.o. male with PMH: Down's Syndrome, GERD, dysphagia, insomnia, memory loss, dementia, OSA, schizoaffective disorder, pituitary adenoma. He presented to [facility] on 05/26/2022 from home with SOB and fever and found to have large right pleural effusion and was transferred to [facility]  for higher level of care. Type of Study: Bedside Swallow Evaluation Previous Swallow Assessment: remote, MBS in 2014 Diet Prior to this Study: Thin liquids;Regular Temperature Spikes Noted: No Respiratory Status: Nasal cannula History of Recent Intubation: No Behavior/Cognition: Alert;Cooperative;Pleasant mood Oral  Cavity Assessment: Within Functional Limits Oral Care Completed by SLP: No Oral Cavity - Dentition: Adequate natural dentition Self-Feeding Abilities: Total assist Patient Positioning: Upright in bed Baseline Vocal Quality: Normal Volitional Cough: Cognitively unable to elicit Volitional Swallow: Unable to elicit    Oral/Motor/Sensory Function Overall Oral Motor/Sensory Function: Within functional limits   Ice Chips     Thin Liquid Thin Liquid: Within functional limits Presentation: Cup    Nectar Thick     Honey Thick     Puree Puree: Not tested   Solid     Solid: Within functional limits      Sonia Baller, MA, CCC-SLP Speech Therapy

## 2022-05-28 NOTE — Progress Notes (Signed)
SLP Cancellation Note  Patient Details Name: GORO WENRICK MRN: [id number] DOB: 1985-10-08   Cancelled treatment:       Reason Eval/Treat Not Completed: Patient's level of consciousness;Other (comment) (Patient has been asleep and unable to arouse; per family and confirmed by RN, he had a dose of fentaynl this morning for pain. SLP will f/u when patient able to be alert.)   Sonia Baller, MA, CCC-SLP Speech Therapy

## 2022-05-28 NOTE — Progress Notes (Signed)
Pt transferred to 5W06 via SWOT nurse. Pt stable at this time.

## 2022-05-28 NOTE — Progress Notes (Addendum)
NAME:  Sergio [name], MRN:  [id number], DOB:  1986/02/04, LOS: 2 ADMISSION DATE:  05/26/2022 CONSULTATION DATE:  05/26/2022 REFERRING MD:  Sedonia Small - EDP CHIEF COMPLAINT:  Respiratory distress, large R pleural effusion   History of Present Illness:  37 year old man who presented to [facility] 1/21 for SOB and fever. Received neb x 2 without relief. Evaluated at [facility], found to have large R pleural effusion and transferred to [facility] ED for higher level of care. PMHx significant for Down syndrome, OSA, GERD with dysphagia, hydrocephalus (s/p VP shunt placement), pituitary adenoma, schizoaffective disorder, dementia/memory impairment.  History is obtained from chart and from patient's father, at bedside. Patient's father states that they were eating dinner the evening prior to admission when patient was noted to have audible wheezing. Appetite was down which was abnormal for patient. +Fever on temperature check. They administered a nebulizer treatment without any improvement. Patient's father states that he has had breathing issues in the remote past, but they thought he had "grown out of it" since it had been several years since patient had any issues with his breathing. Presented to [facility] for evaluation.  On evaluation at [facility], patient was febrile to 100.38F, mildly tachycardic, normotensive. SpO2 initially 87%, improved with supplemental O2. Labs notable for WBC 14.5, Hgb/Plt WNL, Na 130, K 3.6, Cr 1.21 (near baseline), normal LFTs. COVID/Flu/RSV negative. CXR with large R pleural effusion and compressive atelectasis with collapse of RML/RLL. Broad-spectrum Levaquin/Vanc started. Decision made to transfer patient to [facility] ED for higher level of care/additional pulmonary and anesthesia services, should respiratory status decline.   PCCM consulted for ICU admission and intervention.  Pertinent Medical History:   Past Medical History:  Diagnosis Date   Dementia ([place])    Difficulty swallowing    Down's syndrome     Dysphagia causing pulmonary aspiration with swallowing    GERD (gastroesophageal reflux disease)    Headache(784.0)    Hydrocephalus (HCC)    Insomnia    Memory loss    OSA (obstructive sleep apnea)    Pituitary adenoma ([place])    Schizoaffective disorder    Weakness    Significant Hospital Events: Including procedures, antibiotic start and stop dates in addition to other pertinent events   1/21 - Presented to [facility] for SOB, fever. CXR with large R pleural effusion, compressive atelectasis. Broad-spectrum antibiotics started. Nebs administered and placed on HFNC with improvement in sats. Transferred to [facility] for higher level of care (additional pulmonary/anesthesia resources). Bedside POCUS with large R fluid pocket noted on R posterior aspect of thorax. Chest tube placed and 1st dose of pleural lytics given. 1/22 second dose pleural lytics given  Interim History / Subjective:   2L of total output from chest tube since being placed Chest radiograph with minimal improvement in right effusion Spoke with patient's mother at bedside, permission given to give second dose of pleural lytics today  Objective:  Blood pressure (!) 100/59, pulse 67, temperature 98.2 F (36.8 C), temperature source Oral, resp. rate 15, height '5\' 2"'$  (1.575 m), weight 117 kg, SpO2 96 %.        Intake/Output Summary (Last 24 hours) at 05/28/2022 0752 Last data filed at 05/28/2022 0534 Gross per 24 hour  Intake 827.13 ml  Output 580 ml  Net 247.13 ml   Filed Weights   05/26/22 0026 05/28/22 0500  Weight: 123.8 kg 117 kg   Physical Examination: General: acutely ill male with downs syndrome, no acute distress, sitting in chair HEENT: [place]/AT, anicteric  sclera, PERRL, moist mucous membranes Neuro:  Awake, alert. Answering questions appropriately in short phrases.  Responds to verbal stimuli. Following commands consistently. Moves all 4 extremities spontaneously. CV: RRR, no m/g/r. PULM: crackles right base, no  wheezing GI: Soft, nontender, nondistended. Normoactive bowel sounds. Extremities: warm, no edema Skin: Warm/dry, no rashes.  Resolved Hospital Problem List:    Assessment & Plan:  Large R pleural effusion, exudative with compressive atelectasis Concern for PNA - Chest tube placed 1/21 - Pleural lytics given 1/21, and 1/22 - f/u up pleural fluid cultures - stop vancomycin, continue levaquin - cxr improved today - no further plueral lytics today, will check CT Chest scan tomorrow to determine need for further doses   Acute hypoxemic respiratory failure History of OSA - Continue supplemental O2 support; suspect needs will decrease significantly once pleural effusion is evacuated - Wean O2 for sat > 90% - Bronchodilators PRN (DuoNebs) - Pulmonary hygiene  Schizoaffective disorder Dementia versus memory impairment - Continue home medications (Invega, trazodone, melatonin)   History of hydrocephalus s/p VP shunt - Noted   Morbid Obesity BMI 47  Best Practice: (right click and "Reselect all SmartList Selections" daily)   Diet/type: Regular consistency (see orders) DVT prophylaxis: SCDs GI prophylaxis: N/A Lines: N/A Foley:  N/A Code Status:  full code Last date of multidisciplinary goals of care discussion [1/21 - Discussed code status with father at bedside in ED (patient has a previous DNR documented 2018); patient's father confirms FULL CODE status including intubation/MV and CPR if necessary.]  Labs:  CBC: Recent Labs  Lab 05/26/22 0046 05/26/22 0145 05/27/22 0039  WBC 14.5*  --  22.5*  NEUTROABS 12.2*  --   --   HGB 15.5 15.6 13.8  HCT 46.8 46.0 41.8  MCV 96.1  --  97.9  PLT 325  --  354   Basic Metabolic Panel: Recent Labs  Lab 05/26/22 0046 05/26/22 0145 05/27/22 0039  NA 130* 132* 136  K 3.6 4.6 4.8  CL 98  --  101  CO2 24  --  30  GLUCOSE 183*  --  134*  BUN 16  --  9  CREATININE 1.21  --  1.03  CALCIUM 8.0*  --  8.4*  MG  --   --  2.1   PHOS  --   --  3.9   GFR: Estimated Creatinine Clearance: 111.6 mL/min (by C-G formula based on SCr of 1.03 mg/dL). Recent Labs  Lab 05/26/22 0046 05/27/22 0039  PROCALCITON  --  0.22  WBC 14.5* 22.5*  LATICACIDVEN 1.5  --    Liver Function Tests: Recent Labs  Lab 05/26/22 0046  AST 21  ALT 22  ALKPHOS 69  BILITOT 0.4  PROT 7.5  ALBUMIN 3.0*   No results for input(s): "LIPASE", "AMYLASE" in the last 168 hours. No results for input(s): "AMMONIA" in the last 168 hours.  ABG:    Component Value Date/Time   PHART 7.350 03/18/2010 0844   PCO2ART 46.3 (H) 03/18/2010 0844   PO2ART 57.0 (L) 03/18/2010 0844   HCO3 29.1 (H) 05/26/2022 0145   TCO2 30 05/26/2022 0145   O2SAT 94 05/26/2022 0145    Coagulation Profile: Recent Labs  Lab 05/26/22 0046  INR 1.1   Cardiac Enzymes: No results for input(s): "CKTOTAL", "CKMB", "CKMBINDEX", "TROPONINI" in the last 168 hours.  HbA1C: HB A1C (BAYER DCA - WAIVED)  Date/Time Value Ref Range Status  03/05/2021 04:19 PM 5.5 4.8 - 5.6 % Final  Comment:             Prediabetes: 5.7 - 6.4          Diabetes: >6.4          Glycemic control for adults with diabetes: <7.0    Hgb A1c MFr Bld  Date/Time Value Ref Range Status  05/27/2022 12:39 AM 5.5 4.8 - 5.6 % Final    Comment:    (NOTE) Pre diabetes:          5.7%-6.4%  Diabetes:              >6.4%  Glycemic control for   <7.0% adults with diabetes    CBG: Recent Labs  Lab 05/27/22 1530 05/27/22 1923 05/27/22 2311 05/28/22 0449 05/28/22 0727  GLUCAP 107* 114* 110* 99 126*      Critical care time: n/a   Freddi Starr, MD  Pulmonary & Critical Care 05/28/22 7:52 AM  Please see [email or web address] for pager details.  From 7A-7P if no response, please call [phone] After hours, please call ELink [phone]

## 2022-05-29 ENCOUNTER — Inpatient Hospital Stay (HOSPITAL_COMMUNITY): Payer: Medicare Other

## 2022-05-29 ENCOUNTER — Telehealth: Payer: Self-pay | Admitting: Pulmonary Disease

## 2022-05-29 DIAGNOSIS — J90 Pleural effusion, not elsewhere classified: Secondary | ICD-10-CM | POA: Diagnosis not present

## 2022-05-29 DIAGNOSIS — J9601 Acute respiratory failure with hypoxia: Secondary | ICD-10-CM | POA: Diagnosis not present

## 2022-05-29 DIAGNOSIS — Z4682 Encounter for fitting and adjustment of non-vascular catheter: Secondary | ICD-10-CM | POA: Diagnosis not present

## 2022-05-29 LAB — GLUCOSE, CAPILLARY
Glucose-Capillary: 89 mg/dL (ref 70–99)
Glucose-Capillary: 145 mg/dL — ABNORMAL HIGH (ref 70–99)
Glucose-Capillary: 99 mg/dL (ref 70–99)
Glucose-Capillary: 97 mg/dL (ref 70–99)
Glucose-Capillary: 116 mg/dL — ABNORMAL HIGH (ref 70–99)

## 2022-05-29 LAB — BODY FLUID CULTURE W GRAM STAIN: Culture: NO GROWTH

## 2022-05-29 MED ORDER — OXYCODONE HCL 5 MG PO TABS: 2.5000 mg | ORAL_TABLET | ORAL | Status: DC | PRN

## 2022-05-29 MED ORDER — PALIPERIDONE ER 6 MG PO TB24
6.0000 mg | ORAL_TABLET | Freq: Every day | ORAL | Status: DC
  Administered 2022-05-29 – 2022-05-30 (×2): 6 mg via ORAL
  Filled 2022-05-29 (×2): qty 1

## 2022-05-29 MED ORDER — LEVOFLOXACIN 500 MG PO TABS: 500.0000 mg | ORAL_TABLET | Freq: Every day | ORAL | Status: DC

## 2022-05-29 MED ORDER — TRAZODONE HCL 50 MG PO TABS
100.0000 mg | ORAL_TABLET | Freq: Every day | ORAL | Status: DC
  Administered 2022-05-29 – 2022-05-30 (×2): 100 mg via ORAL
  Filled 2022-05-29 (×2): qty 2

## 2022-05-29 MED ORDER — ACETAMINOPHEN 325 MG PO TABS
650.0000 mg | ORAL_TABLET | Freq: Three times a day (TID) | ORAL | Status: AC
  Administered 2022-05-29 – 2022-05-30 (×4): 650 mg via ORAL
  Filled 2022-05-29 (×4): qty 2

## 2022-05-29 MED ORDER — LEVOFLOXACIN 750 MG PO TABS
750.0000 mg | ORAL_TABLET | Freq: Every day | ORAL | Status: DC
  Administered 2022-05-29 – 2022-05-31 (×3): 750 mg via ORAL
  Filled 2022-05-29 (×3): qty 1

## 2022-05-29 MED ORDER — PALIPERIDONE ER 3 MG PO TB24: 3.0000 mg | ORAL_TABLET | Freq: Every day | ORAL | Status: DC

## 2022-05-29 NOTE — Telephone Encounter (Signed)
Please schedule patient for hospital follow up with me or an APP in 10-14 days with a chest x-ray for pleural effusion.  Thank you, Wille Glaser

## 2022-05-29 NOTE — Progress Notes (Addendum)
NAME:  Sergio [name], MRN:  [id number], DOB:  03/31/1986, LOS: 3 ADMISSION DATE:  05/26/2022 CONSULTATION DATE:  05/26/2022 REFERRING MD:  Sedonia Small - EDP CHIEF COMPLAINT:  Respiratory distress, large R pleural effusion   History of Present Illness:  37 year old man who presented to [facility] 1/21 for SOB and fever. Received neb x 2 without relief. Evaluated at [facility], found to have large R pleural effusion and transferred to [facility] ED for higher level of care. PMHx significant for Down syndrome, OSA, GERD with dysphagia, hydrocephalus (s/p VP shunt placement), pituitary adenoma, schizoaffective disorder, dementia/memory impairment.  History is obtained from chart and from patient's father, at bedside. Patient's father states that they were eating dinner the evening prior to admission when patient was noted to have audible wheezing. Appetite was down which was abnormal for patient. +Fever on temperature check. They administered a nebulizer treatment without any improvement. Patient's father states that he has had breathing issues in the remote past, but they thought he had "grown out of it" since it had been several years since patient had any issues with his breathing. Presented to [facility] for evaluation.  On evaluation at [facility], patient was febrile to 100.63F, mildly tachycardic, normotensive. SpO2 initially 87%, improved with supplemental O2. Labs notable for WBC 14.5, Hgb/Plt WNL, Na 130, K 3.6, Cr 1.21 (near baseline), normal LFTs. COVID/Flu/RSV negative. CXR with large R pleural effusion and compressive atelectasis with collapse of RML/RLL. Broad-spectrum Levaquin/Vanc started. Decision made to transfer patient to [facility] ED for higher level of care/additional pulmonary and anesthesia services, should respiratory status decline.   PCCM consulted for ICU admission and intervention.  Pertinent Medical History:   Past Medical History:  Diagnosis Date   Dementia ([place])    Difficulty swallowing    Down's syndrome     Dysphagia causing pulmonary aspiration with swallowing    GERD (gastroesophageal reflux disease)    Headache(784.0)    Hydrocephalus ([facility])    Insomnia    Memory loss    OSA (obstructive sleep apnea)    Pituitary adenoma ([place])    Schizoaffective disorder    Weakness    Significant Hospital Events: Including procedures, antibiotic start and stop dates in addition to other pertinent events   1/21 - Presented to [facility] for SOB, fever. CXR with large R pleural effusion, compressive atelectasis. Broad-spectrum antibiotics started. Nebs administered and placed on HFNC with improvement in sats. Transferred to [facility] for higher level of care (additional pulmonary/anesthesia resources). Bedside POCUS with large R fluid pocket noted on R posterior aspect of thorax. Chest tube placed and 1st dose of pleural lytics given. 1/22 second dose pleural lytics given  Interim History / Subjective:   He is gone for CT of the chest today depending on results may discontinue chest tube today 05/29/2022  Objective:  Blood pressure 119/67, pulse 74, temperature 98.3 F (36.8 C), temperature source Oral, resp. rate (!) 25, height '5\' 2"'$  (1.575 m), weight 117.6 kg, SpO2 91 %.        Intake/Output Summary (Last 24 hours) at 05/29/2022 0957 Last data filed at 05/29/2022 0600 Gross per 24 hour  Intake 182.74 ml  Output 125 ml  Net 57.74 ml   Filed Weights   05/26/22 0026 05/28/22 0500 05/29/22 0500  Weight: 123.8 kg 117 kg 117.6 kg   Physical Examination: General: 37 year old Down syndrome male in no acute distress HEENT: MM pink/moist no JVD is appreciated Neuro: Follows simple commands CV: Heart sounds are regular PULM: Diminished in  the bases right chest tube was noted to be clamped it was unclamped with suction reapplied Scant drainage  GI: soft, bsx4 active   Extremities: warm/dry, 1+ edema  Skin: no rashes or lesions   Resolved Hospital Problem List:    Assessment & Plan:  Large R pleural  effusion, exudative with compressive atelectasis Concern for PNA  Chest tube placed 05/26/2022 with lytics given 120 1:24 AM 05/27/2022. Will obtain CT of the chest and consider discontinuing chest tube at that time Follow culture data Levaquin change to PO DC chest tube Follow up in office appt made    Acute hypoxemic respiratory failure History of OSA - Continue supplemental O2 support; suspect needs will decrease significantly once pleural effusion is evacuated - Wean O2 for sat > 90% - Bronchodilators PRN (DuoNebs) - Pulmonary hygiene  Schizoaffective disorder Dementia versus memory impairment Continue home medications  History of hydrocephalus s/p VP shunt Aware  Morbid Obesity BMI 47  Best Practice: (right click and "Reselect all SmartList Selections" daily)   Diet/type: Regular consistency (see orders) DVT prophylaxis: SCDs GI prophylaxis: N/A Lines: N/A Foley:  N/A Code Status:  full code Last date of multidisciplinary goals of care discussion [1/21 - Discussed code status with father at bedside in ED (patient has a previous DNR documented 2018); patient's father confirms FULL CODE status including intubation/MV and CPR if necessary.]  Labs:  CBC: Recent Labs  Lab 05/26/22 0046 05/26/22 0145 05/27/22 0039 05/28/22 0856  WBC 14.5*  --  22.5* 10.7*  NEUTROABS 12.2*  --   --   --   HGB 15.5 15.6 13.8 13.3  HCT 46.8 46.0 41.8 41.0  MCV 96.1  --  97.9 99.0  PLT 325  --  277 967   Basic Metabolic Panel: Recent Labs  Lab 05/26/22 0046 05/26/22 0145 05/27/22 0039 05/28/22 0856  NA 130* 132* 136 134*  K 3.6 4.6 4.8 4.5  CL 98  --  101 96*  CO2 24  --  30 32  GLUCOSE 183*  --  134* 129*  BUN 16  --  9 15  CREATININE 1.21  --  1.03 1.07  CALCIUM 8.0*  --  8.4* 8.3*  MG  --   --  2.1  --   PHOS  --   --  3.9  --    GFR: Estimated Creatinine Clearance: 107.7 mL/min (by C-G formula based on SCr of 1.07 mg/dL). Recent Labs  Lab 05/26/22 0046  05/27/22 0039 05/28/22 0856  PROCALCITON  --  0.22 0.11  WBC 14.5* 22.5* 10.7*  LATICACIDVEN 1.5  --   --    Liver Function Tests: Recent Labs  Lab 05/26/22 0046  AST 21  ALT 22  ALKPHOS 69  BILITOT 0.4  PROT 7.5  ALBUMIN 3.0*   No results for input(s): "LIPASE", "AMYLASE" in the last 168 hours. No results for input(s): "AMMONIA" in the last 168 hours.  ABG:    Component Value Date/Time   PHART 7.350 03/18/2010 0844   PCO2ART 46.3 (H) 03/18/2010 0844   PO2ART 57.0 (L) 03/18/2010 0844   HCO3 29.1 (H) 05/26/2022 0145   TCO2 30 05/26/2022 0145   O2SAT 94 05/26/2022 0145    Coagulation Profile: Recent Labs  Lab 05/26/22 0046  INR 1.1   Cardiac Enzymes: No results for input(s): "CKTOTAL", "CKMB", "CKMBINDEX", "TROPONINI" in the last 168 hours.  HbA1C: HB A1C (BAYER DCA - WAIVED)  Date/Time Value Ref Range Status  03/05/2021 04:19 PM 5.5 4.8 -  5.6 % Final    Comment:             Prediabetes: 5.7 - 6.4          Diabetes: >6.4          Glycemic control for adults with diabetes: <7.0    Hgb A1c MFr Bld  Date/Time Value Ref Range Status  05/27/2022 12:39 AM 5.5 4.8 - 5.6 % Final    Comment:    (NOTE) Pre diabetes:          5.7%-6.4%  Diabetes:              >6.4%  Glycemic control for   <7.0% adults with diabetes    CBG: Recent Labs  Lab 05/28/22 1532 05/28/22 2132 05/28/22 2343 05/29/22 0343 05/29/22 0840  GLUCAP 106* 106* 140* 89 145*      Critical care time: n/a   Richardson Landry Herberth Deharo ACNP Acute Care Nurse Practitioner [place] Please consult Amion 05/29/2022, 9:57 AM

## 2022-05-29 NOTE — Progress Notes (Signed)
PROGRESS NOTE    Sergio [name]  XBJ:[id number] DOB: Nov 02, 1985 DOA: 05/26/2022 PCP: Janora Norlander, DO  Chief Complaint  Patient presents with   Respiratory Distress    Brief Narrative:  37 year old man who presented to [facility] 1/21 for SOB and fever. Received neb x 2 without relief. Evaluated at [facility], found to have large R pleural effusion and transferred to [facility] ED for higher level of care. PMHx significant for Down syndrome, OSA, GERD with dysphagia, hydrocephalus (s/p VP shunt placement), pituitary adenoma, schizoaffective disorder, dementia/memory impairment, his workup significant for right loculated pleural effusion, status post chest tube insertion and multiple lytics treatment, transferred [facility] 1[facility]   Assessment & Plan:   Principal Problem:   Acute hypoxemic respiratory failure (HCC) Active Problems:   Need for management of chest tube   Pleural effusion on right   Acute respiratory failure with hypoxia (HCC)  Right pleural effusion, exudative with compressive atelectasis, with concern of pneumonia, likely related to microaspiration . Acute hypoxic respiratory failure -Management per PCCM, required chest tube placement 05/26/2022, lytic treatment given 05/27/2022 . -CT chest performed this morning, showing improvement of lower effusion, CT chest to be discontinued by PCCM today . -Continue with Levaquin. -Difficult to guide to use incentive spirometry given Down syndrome/intellectual impairment   OSA -Will order CPAP   Schizoaffective disorder Dementia versus memory impairment Continue with home medications   History of hydrocephalus s/p VP shunt No acute findings   Morbid Obesity bowel movementBody mass index is 47.42 kg/m.  Patient will be started on scheduled Tylenol, and will start on as needed low-dose oxycodone for pain at chest tube insertion site, trying to avoid IV fentanyl as he is more sleepy than baseline as discussed  with sister, as well will obtain right knee x-ray portable given chronic pain in the right knee.     DVT prophylaxis: [place] Heparin Code Status: Full Family Communication: D/W sister at bedside Disposition:   Status is: Inpatient    Consultants:  PCCM   Subjective:  No significant events overnight, patient reports some pain at chest tube insertion site(start report patient has chronic pain in the right knee)  Objective: Vitals:   05/28/22 2300 05/29/22 0300 05/29/22 0500 05/29/22 0900  BP: 116/61 120/65  119/67  Pulse: 64 80  74  Resp: (!) 29 19  (!) 25  Temp: 99 F (37.2 C) 98.5 F (36.9 C)  98.3 F (36.8 C)  TempSrc: Axillary Oral  Oral  SpO2: 93% 90%  91%  Weight:   117.6 kg   Height:        Intake/Output Summary (Last 24 hours) at 05/29/2022 1125 Last data filed at 05/29/2022 0600 Gross per 24 hour  Intake 182.74 ml  Output 125 ml  Net 57.74 ml   Filed Weights   05/26/22 0026 05/28/22 0500 05/29/22 0500  Weight: 123.8 kg 117 kg 117.6 kg    Examination:   Awake, frail, chronically ill-appearing, Down facies Symmetrical Chest wall movement, right lung rales RRR,No Gallops,Rubs or new Murmurs, No Parasternal Heave +ve B.Sounds, Abd Soft, No tenderness, No rebound - guarding or rigidity. No Cyanosis, Clubbing or edema, No new Rash or bruise   .     Data Reviewed: I have personally reviewed following labs and imaging studies  CBC: Recent Labs  Lab 05/26/22 0046 05/26/22 0145 05/27/22 0039 05/28/22 0856  WBC 14.5*  --  22.5* 10.7*  NEUTROABS 12.2*  --   --   --  HGB 15.5 15.6 13.8 13.3  HCT 46.8 46.0 41.8 41.0  MCV 96.1  --  97.9 99.0  PLT 325  --  277 694    Basic Metabolic Panel: Recent Labs  Lab 05/26/22 0046 05/26/22 0145 05/27/22 0039 05/28/22 0856  NA 130* 132* 136 134*  K 3.6 4.6 4.8 4.5  CL 98  --  101 96*  CO2 24  --  30 32  GLUCOSE 183*  --  134* 129*  BUN 16  --  9 15  CREATININE 1.21  --  1.03 1.07  CALCIUM 8.0*  --   8.4* 8.3*  MG  --   --  2.1  --   PHOS  --   --  3.9  --     GFR: Estimated Creatinine Clearance: 107.7 mL/min (by C-G formula based on SCr of 1.07 mg/dL).  Liver Function Tests: Recent Labs  Lab 05/26/22 0046  AST 21  ALT 22  ALKPHOS 69  BILITOT 0.4  PROT 7.5  ALBUMIN 3.0*    CBG: Recent Labs  Lab 05/28/22 1532 05/28/22 2132 05/28/22 2343 05/29/22 0343 05/29/22 0840  GLUCAP 106* 106* 140* 89 145*     Recent Results (from the past 240 hour(s))  Culture, blood (Routine x 2)     Status: None (Preliminary result)   Collection Time: 05/26/22 12:40 AM   Specimen: BLOOD RIGHT HAND  Result Value Ref Range Status   Specimen Description   Final    BLOOD RIGHT HAND Performed at [facility], [address] [place], [place] [address]    Special Requests   Final    BOTTLES DRAWN AEROBIC AND ANAEROBIC Blood Culture results may not be optimal due to an inadequate volume of blood received in culture bottles Performed at [facility], [address] [place], [place] [address]    Culture   Final    NO GROWTH 3 DAYS Performed at [facility], [place] [address], [place], [place] [address]    Report Status PENDING  Incomplete  Resp panel by RT-PCR (RSV, Flu A&B, Covid) Peripheral     Status: None   Collection Time: 05/26/22 12:40 AM   Specimen: Peripheral; Nasal Swab  Result Value Ref Range Status   SARS Coronavirus 2 by RT PCR NEGATIVE NEGATIVE Final    Comment: (NOTE) SARS-CoV-2 target nucleic acids are NOT DETECTED.  The SARS-CoV-2 RNA is generally detectable in upper respiratory specimens during the acute phase of infection. The lowest concentration of SARS-CoV-2 viral copies this assay can detect is 138 copies/mL. A negative result does not preclude SARS-Cov-2 infection and should not be used as the sole basis for treatment or other patient management decisions. A negative result may occur with  improper specimen collection/handling,  submission of specimen other than nasopharyngeal swab, presence of viral mutation(s) within the areas targeted by this assay, and inadequate number of viral copies(<138 copies/mL). A negative result must be combined with clinical observations, patient history, and epidemiological information. The expected result is Negative.  Fact Sheet for Patients:  EntrepreneurPulse.com.au  Fact Sheet for Healthcare Providers:  IncredibleEmployment.be  This test is no t yet approved or cleared by the [place] FDA and  has been authorized for detection and/or diagnosis of SARS-CoV-2 by FDA under an Emergency Use Authorization (EUA). This EUA will remain  in effect (meaning this test can be used) for the duration of the COVID-19 declaration under Section 564(b)(1) of the Act, 21 U.S.C.section  360bbb-3(b)(1), unless the authorization is terminated  or revoked sooner.       Influenza A by PCR NEGATIVE NEGATIVE Final   Influenza B by PCR NEGATIVE NEGATIVE Final    Comment: (NOTE) The Xpert Xpress SARS-CoV-2/FLU/RSV plus assay is intended as an aid in the diagnosis of influenza from Nasopharyngeal swab specimens and should not be used as a sole basis for treatment. Nasal washings and aspirates are unacceptable for Xpert Xpress SARS-CoV-2/FLU/RSV testing.  Fact Sheet for Patients: EntrepreneurPulse.com.au  Fact Sheet for Healthcare Providers: IncredibleEmployment.be  This test is not yet approved or cleared by the [place] FDA and has been authorized for detection and/or diagnosis of SARS-CoV-2 by FDA under an Emergency Use Authorization (EUA). This EUA will remain in effect (meaning this test can be used) for the duration of the COVID-19 declaration under Section 564(b)(1) of the Act, 21 U.S.C. section 360bbb-3(b)(1), unless the authorization is terminated or revoked.     Resp Syncytial Virus by PCR NEGATIVE  NEGATIVE Final    Comment: (NOTE) Fact Sheet for Patients: EntrepreneurPulse.com.au  Fact Sheet for Healthcare Providers: IncredibleEmployment.be  This test is not yet approved or cleared by the [place] FDA and has been authorized for detection and/or diagnosis of SARS-CoV-2 by FDA under an Emergency Use Authorization (EUA). This EUA will remain in effect (meaning this test can be used) for the duration of the COVID-19 declaration under Section 564(b)(1) of the Act, 21 U.S.C. section 360bbb-3(b)(1), unless the authorization is terminated or revoked.  Performed at [facility], [address] [place], [place] [address]   Culture, blood (Routine x 2)     Status: None (Preliminary result)   Collection Time: 05/26/22  1:05 AM   Specimen: BLOOD LEFT WRIST  Result Value Ref Range Status   Specimen Description   Final    BLOOD LEFT WRIST Performed at [facility], [address] [place], [place] [address]    Special Requests   Final    BOTTLES DRAWN AEROBIC AND ANAEROBIC Blood Culture results may not be optimal due to an inadequate volume of blood received in culture bottles Performed at [facility], [address] [place], [place] [address]    Culture   Final    NO GROWTH 3 DAYS Performed at [facility], [place] [address] [place], [place] [address]    Report Status PENDING  Incomplete  MRSA Next Gen by PCR, Nasal     Status: Abnormal   Collection Time: 05/26/22  6:28 AM   Specimen: Nasal Mucosa; Nasal Swab  Result Value Ref Range Status   MRSA by PCR Next Gen DETECTED (A) NOT DETECTED Final    Comment: RESULT CALLED TO, READ BACK BY AND VERIFIED WITH: RN Rolland Porter [id number] AT 0858 BY EC (NOTE) The GeneXpert MRSA Assay (FDA approved for NASAL specimens only), is one component of a comprehensive MRSA colonization surveillance program. It is not intended to diagnose MRSA infection nor to guide or  monitor treatment for MRSA infections. Test performance is not FDA approved in patients less than 14 years old. Performed at [facility], [place] [address], [place], [place] [address]   Body fluid culture w Gram Stain     Status: None   Collection Time: 05/26/22  9:55 AM   Specimen: Pleural Fluid  Result Value Ref Range Status   Specimen Description PLEURAL  Final   Special Requests RIGHT  Final   Gram  Stain   Final    MODERATE WBC PRESENT, PREDOMINANTLY PMN NO ORGANISMS SEEN    Culture   Final    NO GROWTH 3 DAYS Performed at [place] [address] [place], [place] [address]    Report Status 05/29/2022 FINAL  Final         Radiology Studies: CT CHEST WO CONTRAST  Result Date: 05/29/2022 CLINICAL DATA:  Pleural effusion follow-up status post chest tube drainage. EXAM: CT CHEST WITHOUT CONTRAST TECHNIQUE: Multidetector CT imaging of the chest was performed following the standard protocol without IV contrast. RADIATION DOSE REDUCTION: This exam was performed according to the departmental dose-optimization program which includes automated exposure control, adjustment of the mA and/or kV according to patient size and/or use of iterative reconstruction technique. COMPARISON:  Chest x-ray from yesterday. FINDINGS: Cardiovascular: The heart is at the upper limits of normal in size. Trace pericardial effusion. No thoracic aortic aneurysm. Mediastinum/Nodes: No enlarged mediastinal or axillary lymph nodes. Thyroid gland, trachea, and esophagus demonstrate no significant findings. Lungs/Pleura: Posterior approach right-sided pigtail chest tube in place. Multiple side holes are extrapleural in location. Small residual loculated right pleural effusion with pleural thickening. Dense atelectasis versus consolidation in the lateral right middle lobe medial right lower lobe. Hazy ground-glass densities and scattered smooth interlobular septal thickening throughout much of the right  lung. Trace left pleural effusion with subsegmental atelectasis in the left upper and lower lobes. Upper Abdomen: No acute abnormality. Musculoskeletal: No acute or significant osseous findings. Multiple vertebral body hemangiomas. IMPRESSION: 1. Posterior approach right-sided pigtail chest tube in place. Multiple side holes are extrapleural in location. Consider replacement or removal. 2. Small residual loculated right pleural effusion with pleural thickening, suggestive of resolving empyema. 3. Dense atelectasis versus pneumonia in the lateral right middle lobe and medial right lower lobe. 4. Hazy ground-glass densities and scattered smooth interlobular septal thickening throughout much of the right lung, likely residual re-expansion pulmonary edema. 5. Trace left pleural effusion. Electronically Signed   By: Titus Dubin M.D.   On: 05/29/2022 09:02   DG CHEST PORT 1 VIEW  Result Date: 05/28/2022 CLINICAL DATA:  Pleural effusion. EXAM: PORTABLE CHEST 1 VIEW COMPARISON:  AP chest 122 2024, 05/26/2022 FINDINGS: Ventricular shunt catheter fragment again overlies the right neck and superior right hemithorax. Right basilar pleural drainage catheter appears now estimated changed in position on single frontal view. Marked interval improvement in the prior moderate to large right pleural effusion, now with minimal fluid at the right hemidiaphragm and likely within right minor fissure. No definite left pleural effusion. Moderate bilateral interstitial thickening is similar to prior. Cardiac silhouette is again moderately enlarged. Mediastinal contours are not well evaluated due to AP technique and low lung volumes. No pneumothorax is seen. No acute skeletal abnormality. IMPRESSION: 1. Marked interval improvement in the prior moderate to large right pleural effusion, now with minimal fluid overlying the right hemidiaphragm and likely within the right minor fissure. 2. Moderate bilateral interstitial thickening is  similar to prior. Electronically Signed   By: Yvonne Kendall M.D.   On: 05/28/2022 08:22   VAS [place] LOWER EXTREMITY VENOUS (DVT)  Result Date: 05/27/2022  Lower Venous DVT Study Patient Name:  Sergio [name]  Date of Exam:   05/27/2022 Medical Rec #: [id number]           Accession #:    [id number] Date of Birth: [birth year]           Patient Gender: M Patient  Age:   36 years Exam Location:  [facility] Procedure:      VAS [place] LOWER EXTREMITY VENOUS (DVT) Referring Phys: Noemi Chapel --------------------------------------------------------------------------------  Indications: Pain.  Comparison Study: no prior Performing Technologist: Archie Patten RVS  Examination Guidelines: A complete evaluation includes B-mode imaging, spectral Doppler, color Doppler, and power Doppler as needed of all accessible portions of each vessel. Bilateral testing is considered an integral part of a complete examination. Limited examinations for reoccurring indications may be performed as noted. The reflux portion of the exam is performed with the patient in reverse Trendelenburg.  +---------+---------------+---------+-----------+----------+--------------+ RIGHT    CompressibilityPhasicitySpontaneityPropertiesThrombus Aging +---------+---------------+---------+-----------+----------+--------------+ CFV      Full           Yes      Yes                                 +---------+---------------+---------+-----------+----------+--------------+ SFJ      Full                                                        +---------+---------------+---------+-----------+----------+--------------+ FV Prox  Full                                                        +---------+---------------+---------+-----------+----------+--------------+ FV Mid   Full                                                        +---------+---------------+---------+-----------+----------+--------------+ FV DistalFull                                                         +---------+---------------+---------+-----------+----------+--------------+ PFV      Full                                                        +---------+---------------+---------+-----------+----------+--------------+ POP      Full           Yes      Yes                                 +---------+---------------+---------+-----------+----------+--------------+ PTV      Full                                                        +---------+---------------+---------+-----------+----------+--------------+ PERO     Full                                                        +---------+---------------+---------+-----------+----------+--------------+   +---------+---------------+---------+-----------+----------+--------------+  LEFT     CompressibilityPhasicitySpontaneityPropertiesThrombus Aging +---------+---------------+---------+-----------+----------+--------------+ CFV      Full           Yes      Yes                                 +---------+---------------+---------+-----------+----------+--------------+ SFJ      Full                                                        +---------+---------------+---------+-----------+----------+--------------+ FV Prox  Full                                                        +---------+---------------+---------+-----------+----------+--------------+ FV Mid   Full                                                        +---------+---------------+---------+-----------+----------+--------------+ FV DistalFull                                                        +---------+---------------+---------+-----------+----------+--------------+ PFV      Full                                                        +---------+---------------+---------+-----------+----------+--------------+ POP      Full           Yes      Yes                                  +---------+---------------+---------+-----------+----------+--------------+ PTV      Full                                                        +---------+---------------+---------+-----------+----------+--------------+ PERO     Full                                                        +---------+---------------+---------+-----------+----------+--------------+     Summary: BILATERAL: - No evidence of deep vein thrombosis seen in the lower extremities, bilaterally. -No evidence of popliteal cyst, bilaterally.   *See table(s) above for measurements and observations. Electronically signed by Servando Snare MD on 05/27/2022 at 7:49:20 PM.  Final         Scheduled Meds:  acetaminophen  650 mg Oral TID   Chlorhexidine Gluconate Cloth  6 each Topical Daily   heparin  5,000 Units Subcutaneous Q8H   insulin aspart  1-3 Units Subcutaneous Q4H   levofloxacin  750 mg Oral Daily   melatonin  10 mg Oral QHS   paliperidone  6 mg Oral QHS   sodium chloride flush  10 mL Intrapleural Q8H   traZODone  200 mg Oral QHS   Continuous Infusions:  sodium chloride Stopped (05/26/22 1751)     LOS: 3 days       Phillips Climes, MD [facility]   To contact the attending provider between 7A-7P or the covering provider during after hours 7P-7A, please log into the web site [email or web address] and access using universal [place] password for that web site. If you do not have the password, please call the hospital operator.  05/29/2022, 11:25 AM

## 2022-05-29 NOTE — Evaluation (Signed)
Physical Therapy Evaluation Patient Details Name: Sergio [name] MRN: [id number] DOB: Sep 15, 1985 Today's Date: 05/29/2022  History of Present Illness  Pt is a 37 year old man admitted on 05/26/22 with shortness of breath, fever and poor appetite with hypoxic respiratory failure and R pleural effusion. chest tube R side. PMH: Down Syndrome, OSA, GERD with dysphagia, hydrocephalus with VP shunt, pitutary adenoma, schizoaffective disorder, dementia/memory impairment, recurrent PNA.  Clinical Impression   Pt presents with generalized weakness, impaired balance, dyspnea with 2LO2 need at this time, and impaired activity tolerance. Pt to benefit from acute PT to address deficits. Pt requiring min-mod +2 assist for stand and taking lateral/forwards steps today, limited in further progression given fatiguability. Pt has 24/7 support from his parents at d/c, pt's sister also lives closeby, anticipate good recovery at home. PT to progress mobility as tolerated, and will continue to follow acutely.         Recommendations for follow up therapy are one component of a multi-disciplinary discharge planning process, led by the attending physician.  Recommendations may be updated based on patient status, additional functional criteria and insurance authorization.  Follow Up Recommendations Home health PT      Assistance Recommended at Discharge Frequent or constant Supervision/Assistance  Patient can return home with the following  A little help with walking and/or transfers;A little help with bathing/dressing/bathroom    Equipment Recommendations None recommended by PT  Recommendations for Other Services       Functional Status Assessment Patient has had a recent decline in their functional status and demonstrates the ability to make significant improvements in function in a reasonable and predictable amount of time.     Precautions / Restrictions Precautions Precautions: Fall Precaution  Comments: watch O2 Restrictions Weight Bearing Restrictions: No      Mobility  Bed Mobility Overal bed mobility: Needs Assistance Bed Mobility: Supine to Sit, Sit to Supine     Supine to sit: +2 for physical assistance, Mod assist, HOB elevated Sit to supine: +2 for physical assistance, Mod assist   General bed mobility comments: assist for LEs over EOB, to raise trunk and hips to EOB, guided trunk and assisted LEs back into bed, pulled up with pt in long sitting with bed pad    Transfers Overall transfer level: Needs assistance Equipment used: 2 person hand held assist Transfers: Sit to/from Stand Sit to Stand: +2 physical assistance, Mod assist, Min assist           General transfer comment: needing mod assist with first trial, min second, able to take side steps along EOB, forward and back    Ambulation/Gait               General Gait Details: x1 step forward and back, x1 lateral step with min steadying assist  Stairs            Wheelchair Mobility    Modified Rankin (Stroke Patients Only)       Balance Overall balance assessment: Needs assistance Sitting-balance support: Feet supported Sitting balance-Leahy Scale: Fair     Standing balance support: Bilateral upper extremity supported Standing balance-Leahy Scale: Poor Standing balance comment: reliant on hand held assist                             Pertinent Vitals/Pain Pain Assessment Pain Assessment: Faces Faces Pain Scale: Hurts little more Pain Location: generalized, and R knee Pain Descriptors / Indicators: Moaning,  Grimacing Pain Intervention(s): Limited activity within patient's tolerance, Monitored during session, Repositioned    Home Living Family/patient expects to be discharged to:: Private residence Living Arrangements: Parent Available Help at Discharge: Family;Available 24 hours/day Type of Home: House Home Access: Ramped entrance       Home Layout: One  level Home Equipment: Conservation officer, nature (2 wheels);Wheelchair - manual      Prior Function Prior Level of Function : Needs assist             Mobility Comments: walks with hand held assist ADLs Comments: stands to shower, significant assist for bathing and dressing, self feeds, takes himself to the bathroom at night     Hand Dominance   Dominant Hand: Right    Extremity/Trunk Assessment   Upper Extremity Assessment Upper Extremity Assessment: Defer to OT evaluation    Lower Extremity Assessment Lower Extremity Assessment: Generalized weakness    Cervical / Trunk Assessment Cervical / Trunk Assessment: Other exceptions Cervical / Trunk Exceptions: obesity, hypotonia  Communication   Communication: Expressive difficulties (baseline)  Cognition Arousal/Alertness: Awake/alert Behavior During Therapy: WFL for tasks assessed/performed (smiling at times) Overall Cognitive Status: History of cognitive impairments - at baseline                                 General Comments: pt with inconsistent reliability with yes/no questions        General Comments General comments (skin integrity, edema, etc.): 2LO2 via [place], SPO26mn 89%. Pt with discomfort with patellar mobilization R knee, suspect patellofemoral dysfunction related to hypotonia    Exercises     Assessment/Plan    PT Assessment Patient needs continued PT services  PT Problem List Decreased strength;Decreased mobility;Cardiopulmonary status limiting activity;Decreased balance;Decreased knowledge of use of DME;Pain;Decreased activity tolerance;Decreased safety awareness       PT Treatment Interventions DME instruction;Therapeutic activities;Gait training;Therapeutic exercise;Patient/family education;Balance training;Stair training;Functional mobility training;Neuromuscular re-education    PT Goals (Current goals can be found in the Care Plan section)  Acute Rehab PT Goals Patient Stated Goal: home PT  Goal Formulation: With patient/family Time For Goal Achievement: 06/12/22 Potential to Achieve Goals: Good    Frequency Min 3X/week     Co-evaluation PT/OT/SLP Co-Evaluation/Treatment: Yes Reason for Co-Treatment: For patient/therapist safety;To address functional/ADL transfers PT goals addressed during session: Mobility/safety with mobility;Balance OT goals addressed during session: ADL's and self-care;Strengthening/ROM       AM-PAC PT "6 Clicks" Mobility  Outcome Measure Help needed turning from your back to your side while in a flat bed without using bedrails?: A Lot Help needed moving from lying on your back to sitting on the side of a flat bed without using bedrails?: A Lot Help needed moving to and from a bed to a chair (including a wheelchair)?: A Lot Help needed standing up from a chair using your arms (e.g., wheelchair or bedside chair)?: A Lot Help needed to walk in hospital room?: A Lot Help needed climbing 3-5 steps with a railing? : Total 6 Click Score: 11    End of Session Equipment Utilized During Treatment: Oxygen Activity Tolerance: Patient tolerated treatment well Patient left: in bed;with call bell/phone within reach;with bed alarm set;with family/visitor present Nurse Communication: Mobility status PT Visit Diagnosis: Other abnormalities of gait and mobility (R26.89);Muscle weakness (generalized) (M62.81)    Time: 14193-7902PT Time Calculation (min) (ACUTE ONLY): 52 min   Charges:   PT Evaluation $PT Eval Low Complexity:  1 Low         Stacie Glaze, PT DPT Acute Rehabilitation Services Pager [phone]  Office [phone]   Louis Matte 05/29/2022, 3:40 PM

## 2022-05-29 NOTE — Plan of Care (Signed)

## 2022-05-29 NOTE — Progress Notes (Addendum)
Right pigtail chest tube removed per MD order. Order verified and most recent chest imaging reviewed. Suction discontinued. No air leak noted at rest or when pt coughed. Pt lying in bed while catheter is being removed. Catheter clamped and removed without issue. Petrolatum gauze over site covered with 2x2 gauze and occlusive dressing applied. Pt in no distress following chest tube removal. SpO2 93% on 2LNC follow chest tube removal. Catheter inspected and intact post removal. Breaths sounds heard in all lung fields.   Hilary Pundt Rapid Response RN

## 2022-05-29 NOTE — Care Management Important Message (Signed)
Important Message  Patient Details  Name: Sergio [name] MRN: [id number] Date of Birth: November 17, 1985   Medicare Important Message Given:  Yes     Hannah Beat 05/29/2022, 1:15 PM

## 2022-05-29 NOTE — Evaluation (Signed)
Occupational Therapy Evaluation Patient Details Name: Sergio [name] MRN: [id number] DOB: August 21, 1985 Today's Date: 05/29/2022   History of Present Illness Pt is a 37 year old man admitted on 05/26/22 with shortness of breath, fever and poor appetite with hypoxic respiratory failure and R pleural effusion. PMH: Down Syndrome, OSA, GERD with dysphagia, hydrocephalus with VP shunt, pitutary adenoma, schizoaffective disorder, dementia/memory impairment, recurrent PNA.   Clinical Impression   Pt typically walks with hand held assist with h/o R knee pain/instability. He can self feed and is assisted for showering in standing, dressing and all IADLs. Pt with lethargy initially, but became awake once assisted to sit EOB. Pt stood and took steps with B hand held assist with +2 min assist. No indications of pain, but pt is typically stoic, per his sister and when asked, his yes/no responses were not reliable. Pt with SpO2 down to 89% with 2L doffed while blowing nose, 93% with 2L. Will follow acutely, pt may benefit from seated showering, will address with pt's mother.      Recommendations for follow up therapy are one component of a multi-disciplinary discharge planning process, led by the attending physician.  Recommendations may be updated based on patient status, additional functional criteria and insurance authorization.   Follow Up Recommendations  No OT follow up     Assistance Recommended at Discharge Frequent or constant Supervision/Assistance  Patient can return home with the following Two people to help with walking and/or transfers;A lot of help with bathing/dressing/bathroom;Assistance with cooking/housework;Assistance with feeding;Direct supervision/assist for medications management;Direct supervision/assist for financial management;Assist for transportation;Help with stairs or ramp for entrance    Functional Status Assessment  Patient has had a recent decline in their functional status  and/or demonstrates limited ability to make significant improvements in function in a reasonable and predictable amount of time  Equipment Recommendations  None recommended by OT    Recommendations for Other Services       Precautions / Restrictions Precautions Precautions: Fall Precaution Comments: watch O2 Restrictions Weight Bearing Restrictions: No      Mobility Bed Mobility Overal bed mobility: Needs Assistance Bed Mobility: Supine to Sit, Sit to Supine     Supine to sit: +2 for physical assistance, Mod assist, HOB elevated Sit to supine: +2 for physical assistance, Mod assist   General bed mobility comments: assist for LEs over EOB, to raise trunk and hips to EOB, guided trunk and assisted LEs back into bed, pulled up with pt in long sitting with bed pad    Transfers Overall transfer level: Needs assistance Equipment used: 2 person hand held assist Transfers: Sit to/from Stand Sit to Stand: +2 physical assistance, Mod assist, Min assist           General transfer comment: needing mod assist with first trial, min second, able to take side steps along EOB, forward and back      Balance Overall balance assessment: Needs assistance Sitting-balance support: Feet supported Sitting balance-Leahy Scale: Fair     Standing balance support: Bilateral upper extremity supported Standing balance-Leahy Scale: Poor Standing balance comment: reliant on hand held assist                           ADL either performed or assessed with clinical judgement   ADL  General ADL Comments: total assist     Vision Ability to See in Adequate Light: 0 Adequate Patient Visual Report: No change from baseline       Perception     Praxis      Pertinent Vitals/Pain Pain Assessment Pain Assessment: Faces Faces Pain Scale: No hurt     Hand Dominance Right   Extremity/Trunk Assessment Upper Extremity  Assessment Upper Extremity Assessment: Generalized weakness   Lower Extremity Assessment Lower Extremity Assessment: Defer to PT evaluation   Cervical / Trunk Assessment Cervical / Trunk Assessment: Other exceptions (obesity)   Communication Communication Communication: Expressive difficulties (baseline)   Cognition Arousal/Alertness: Awake/alert Behavior During Therapy: WFL for tasks assessed/performed (smiling at times) Overall Cognitive Status: History of cognitive impairments - at baseline                                 General Comments: pt with inconsistent reliability with yes/no questions     General Comments       Exercises     Shoulder Instructions      Home Living Family/patient expects to be discharged to:: Private residence Living Arrangements: Parent Available Help at Discharge: Family;Available 24 hours/day Type of Home: House Home Access: Ramped entrance     Home Layout: One level     Bathroom Shower/Tub: Tub/shower unit         Home Equipment: Conservation officer, nature (2 wheels);Wheelchair - manual          Prior Functioning/Environment Prior Level of Function : Needs assist             Mobility Comments: walks with hand held assist ADLs Comments: stands to shower, significant assist for bathing and dressing, self feeds, takes himself to the bathroom at night        OT Problem List: Decreased strength;Decreased activity tolerance;Impaired balance (sitting and/or standing);Decreased cognition      OT Treatment/Interventions: Self-care/ADL training;DME and/or AE instruction;Therapeutic activities;Cognitive remediation/compensation;Patient/family education;Balance training    OT Goals(Current goals can be found in the care plan section) Acute Rehab OT Goals OT Goal Formulation: With family Time For Goal Achievement: 06/12/22 Potential to Achieve Goals: Good ADL Goals Pt Will Perform Eating: with set-up;sitting Pt Will Perform  Grooming: with min assist;standing (at least one activity) Pt Will Transfer to Toilet: with min assist;bedside commode;ambulating Additional ADL Goal #1: Pt will perform bed mobility with supervision.  OT Frequency: Min 2X/week    Co-evaluation PT/OT/SLP Co-Evaluation/Treatment: Yes Reason for Co-Treatment: For patient/therapist safety   OT goals addressed during session: ADL's and self-care;Strengthening/ROM      AM-PAC OT "6 Clicks" Daily Activity     Outcome Measure Help from another person eating meals?: Total Help from another person taking care of personal grooming?: Total Help from another person toileting, which includes using toliet, bedpan, or urinal?: Total Help from another person bathing (including washing, rinsing, drying)?: Total Help from another person to put on and taking off regular upper body clothing?: Total Help from another person to put on and taking off regular lower body clothing?: Total 6 Click Score: 6   End of Session Equipment Utilized During Treatment: Oxygen (2L)  Activity Tolerance: Patient tolerated treatment well Patient left: in bed;with call bell/phone within reach;with bed alarm set;with family/visitor present  OT Visit Diagnosis: Unsteadiness on feet (R26.81);Other abnormalities of gait and mobility (R26.89);Muscle weakness (generalized) (M62.81);Other symptoms and signs involving cognitive function  Time: 4010-2725 OT Time Calculation (min): 50 min Charges:  OT General Charges $OT Visit: 1 Visit OT Evaluation $OT Eval Moderate Complexity: [place], OTR/L Acute Rehabilitation Services Office: [phone]   Malka So 05/29/2022, 1:55 PM

## 2022-05-30 ENCOUNTER — Inpatient Hospital Stay (HOSPITAL_COMMUNITY): Payer: Medicare Other

## 2022-05-30 ENCOUNTER — Other Ambulatory Visit: Payer: Self-pay | Admitting: Family Medicine

## 2022-05-30 DIAGNOSIS — J9601 Acute respiratory failure with hypoxia: Secondary | ICD-10-CM | POA: Diagnosis not present

## 2022-05-30 DIAGNOSIS — J90 Pleural effusion, not elsewhere classified: Secondary | ICD-10-CM | POA: Diagnosis not present

## 2022-05-30 LAB — CBC
HCT: 40.5 % (ref 39.0–52.0)
Hemoglobin: 13.2 g/dL (ref 13.0–17.0)
MCH: 32.2 pg (ref 26.0–34.0)
MCHC: 32.6 g/dL (ref 30.0–36.0)
MCV: 98.8 fL (ref 80.0–100.0)
Platelets: 281 10*3/uL (ref 150–400)
RBC: 4.1 MIL/uL — ABNORMAL LOW (ref 4.22–5.81)
RDW: 14.3 % (ref 11.5–15.5)
WBC: 7.3 10*3/uL (ref 4.0–10.5)
nRBC: 0 % (ref 0.0–0.2)

## 2022-05-30 LAB — BASIC METABOLIC PANEL
Anion gap: 8 (ref 5–15)
BUN: 11 mg/dL (ref 6–20)
CO2: 33 mmol/L — ABNORMAL HIGH (ref 22–32)
Calcium: 8.5 mg/dL — ABNORMAL LOW (ref 8.9–10.3)
Chloride: 98 mmol/L (ref 98–111)
Creatinine, Ser: 0.91 mg/dL (ref 0.61–1.24)
GFR, Estimated: >60 mL/min (ref >60)
Glucose, Bld: 99 mg/dL (ref 70–99)
Potassium: 4.2 mmol/L (ref 3.5–5.1)
Sodium: 139 mmol/L (ref 135–145)

## 2022-05-30 LAB — GLUCOSE, CAPILLARY
Glucose-Capillary: 102 mg/dL — ABNORMAL HIGH (ref 70–99)
Glucose-Capillary: 104 mg/dL — ABNORMAL HIGH (ref 70–99)
Glucose-Capillary: 109 mg/dL — ABNORMAL HIGH (ref 70–99)
Glucose-Capillary: 116 mg/dL — ABNORMAL HIGH (ref 70–99)
Glucose-Capillary: 118 mg/dL — ABNORMAL HIGH (ref 70–99)
Glucose-Capillary: 121 mg/dL — ABNORMAL HIGH (ref 70–99)

## 2022-05-30 NOTE — Progress Notes (Signed)
Mother said pt is not comfortable wearing any type of mask. She is waiting for another sleep study to see if pt can tolerate nasal pillows instead

## 2022-05-30 NOTE — TOC Progression Note (Signed)
Transition of Care [facility]) - Progression Note    Patient Details  Name: Sergio [name] MRN: [id number] Date of Birth: August 22, 1985  [facility]) CM/SW Contact  Levonne Lapping, RN Phone Number: 05/30/2022, 2:04 PM  Clinical Narrative:   CM met with Patient bedside. He was accompanied by his Father and floor RN. CM discussed DC plan to home and recommended home health physical therapy.  Choice list given- No preference so went with [facility] rated [facility].  Liaison contacted with referral and it was accepted.   CM will follow for possible O2 needs at DC.   TOC will continue to monitor patient advancement through interdisciplinary progression rounds.  Transition of Care [facility]) Screening Note   Patient Details  Name: Sergio [name] Date of Birth: 10-Jan-1986   [facility]) CM/SW Contact:    Levonne Lapping, RN Phone Number: 05/30/2022, 2:07 PM              Barriers to Discharge: Continued Medical Work up  Expected Discharge Plan and Services   Discharge Planning Services: CM Consult   Living arrangements for the past 2 months: Single Family Home                                       Social Determinants of Health (SDOH) Interventions SDOH Screenings   Food Insecurity: Food Insecurity Present (05/17/2021)  Housing: Low Risk  (05/17/2021)  Transportation Needs: No Transportation Needs (05/17/2021)  Alcohol Screen: Low Risk  (05/17/2021)  Depression (PHQ2-9): Low Risk  (05/17/2021)  Recent Concern: Depression (PHQ2-9) - Medium Risk (03/05/2021)  Financial Resource Strain: Medium Risk (05/17/2021)  Physical Activity: Inactive (05/17/2021)  Social Connections: Moderately Integrated (05/17/2021)  Stress: Stress Concern Present (05/17/2021)  Tobacco Use: Low Risk  (05/26/2022)    Readmission Risk Interventions     No data to display

## 2022-05-30 NOTE — Plan of Care (Signed)

## 2022-05-30 NOTE — Progress Notes (Signed)
Physical Therapy Treatment Patient Details Name: Sergio [name] MRN: [id number] DOB: 03-25-1986 Today's Date: 05/30/2022   History of Present Illness Pt is a 37 year old man admitted on 05/26/22 with shortness of breath, fever and poor appetite with hypoxic respiratory failure and R pleural effusion. chest tube R side. PMH: Down Syndrome, OSA, GERD with dysphagia, hydrocephalus with VP shunt, pitutary adenoma, schizoaffective disorder, dementia/memory impairment, recurrent PNA.    PT Comments    Lethargic but able to ambulate to bathroom and back to bed with PT this morning, up to mod assist initially for balance with hand held support, progressed to min assist on return. Attempt with RW next visit as indicated, suspect this will improve stability if still lethargic. More alert after short walk and back to bed; father in room, very supportive. See VS below. Patient will continue to benefit from skilled physical therapy services to further improve independence with functional mobility.   SpO2 at rest 94% on 2L supplemental O2. BP 85/63 supine. Seated BP 95/72 HR 77. SpO2 88-91% on RA while ambulating 3/4 dyspnea.     Recommendations for follow up therapy are one component of a multi-disciplinary discharge planning process, led by the attending physician.  Recommendations may be updated based on patient status, additional functional criteria and insurance authorization.  Follow Up Recommendations  Home health PT     Assistance Recommended at Discharge Frequent or constant Supervision/Assistance  Patient can return home with the following A little help with walking and/or transfers;A little help with bathing/dressing/bathroom   Equipment Recommendations  None recommended by PT    Recommendations for Other Services       Precautions / Restrictions Precautions Precautions: Fall Precaution Comments: watch O2 Restrictions Weight Bearing Restrictions: No     Mobility  Bed  Mobility Overal bed mobility: Needs Assistance Bed Mobility: Supine to Sit, Sit to Supine     Supine to sit: Supervision, HOB elevated Sit to supine: Modified independent (Device/Increase time)   General bed mobility comments: Supervision for safety to rise to EOB with cues to faciliate, takes extra time, suspect due to lethargy. Mod I returning back into bed and adjusting.    Transfers Overall transfer level: Needs assistance Equipment used: 1 person hand held assist Transfers: Sit to/from Stand Sit to Stand: Min assist           General transfer comment: Min assist for balance to rise from bed, showing moderate instability. HHA provided (father declines AD to see how pt does without.) Performed from bed and toilet. Cues to use hand rail in bathroom to rise.    Ambulation/Gait Ambulation/Gait assistance: Min assist, Mod assist Gait Distance (Feet): 18 Feet (x2) Assistive device: 1 person hand held assist Gait Pattern/deviations: Step-through pattern, Decreased stride length, Staggering left, Staggering right, Wide base of support, Trunk flexed, Shuffle Gait velocity: slow Gait velocity interpretation: <1.31 ft/sec, indicative of household ambulator   General Gait Details: Mod assist for initial steps as pt was staggering and quite lethargic yet communicative with staff and father. Cues for safety and upright posture. Progressed to min assist with hand held support, for balance. No overt buckling this bout. Amb to bathroom and back, more alert on return with min assist. Father declines RW at this time to see how pt does without AD as at baseline he does not need AD. Would try RW next visit. SpO2 88-91% on room air, 3/4 dyspnea.   Stairs  Wheelchair Mobility    Modified Rankin (Stroke Patients Only)       Balance Overall balance assessment: Needs assistance Sitting-balance support: Feet supported, No upper extremity supported Sitting balance-Leahy Scale:  Fair     Standing balance support: Single extremity supported Standing balance-Leahy Scale: Poor Standing balance comment: reliant on hand held assist                            Cognition Arousal/Alertness: Lethargic Behavior During Therapy: WFL for tasks assessed/performed (smiling at times) Overall Cognitive Status: History of cognitive impairments - at baseline                                 General Comments: Father reports better today, but still very sleepy, needing more cues that usual.        Exercises      General Comments General comments (skin integrity, edema, etc.): SpO2 at rest 94% on 2L supplemental O2. BP 85/63 supine, HR in 70s. Seated BP 95/72 HR 77. SpO2 88-91% on RA while ambulating moderate+ dyspnea.      Pertinent Vitals/Pain Pain Assessment Pain Assessment: Faces Faces Pain Scale: Hurts a little bit Pain Location: generalized, and R knee Pain Descriptors / Indicators: Guarding Pain Intervention(s): Monitored during session, Repositioned    Home Living                          Prior Function            PT Goals (current goals can now be found in the care plan section) Acute Rehab PT Goals Patient Stated Goal: home PT Goal Formulation: With patient/family Time For Goal Achievement: 06/12/22 Potential to Achieve Goals: Good Progress towards PT goals: Progressing toward goals    Frequency    Min 3X/week      PT Plan Current plan remains appropriate    Co-evaluation PT/OT/SLP Co-Evaluation/Treatment: Yes            AM-PAC PT "6 Clicks" Mobility   Outcome Measure  Help needed turning from your back to your side while in a flat bed without using bedrails?: A Little Help needed moving from lying on your back to sitting on the side of a flat bed without using bedrails?: A Little Help needed moving to and from a bed to a chair (including a wheelchair)?: A Little Help needed standing up from a chair  using your arms (e.g., wheelchair or bedside chair)?: A Lot Help needed to walk in hospital room?: A Lot Help needed climbing 3-5 steps with a railing? : Total 6 Click Score: 14    End of Session Equipment Utilized During Treatment: Oxygen Activity Tolerance: Patient limited by lethargy Patient left: in bed;with call bell/phone within reach;with bed alarm set;with family/visitor present Nurse Communication: Mobility status PT Visit Diagnosis: Other abnormalities of gait and mobility (R26.89);Muscle weakness (generalized) (M62.81)     Time: 9166-0600 PT Time Calculation (min) (ACUTE ONLY): 13 min  Charges:  $Therapeutic Activity: 8-22 mins                     Candie Mile, PT, DPT Physical Therapist Acute Rehabilitation Services [facility]    Ellouise Newer 05/30/2022, 12:55 PM

## 2022-05-30 NOTE — Progress Notes (Signed)
PROGRESS NOTE    Sergio [name]  ZOX:[id number] DOB: May 28, 1985 DOA: 05/26/2022 PCP: Janora Norlander, DO  Chief Complaint  Patient presents with   Respiratory Distress    Brief Narrative:  37 year old man who presented to [facility] 1/21 for SOB and fever. Received neb x 2 without relief. Evaluated at [facility], found to have large R pleural effusion and transferred to [facility] ED for higher level of care. PMHx significant for Down syndrome, OSA, GERD with dysphagia, hydrocephalus (s/p VP shunt placement), pituitary adenoma, schizoaffective disorder, dementia/memory impairment, his workup significant for right loculated pleural effusion, status post chest tube insertion and multiple lytics treatment, transferred [facility] 1/[facility]   Assessment & Plan:   Principal Problem:   Acute hypoxemic respiratory failure (HCC) Active Problems:   Need for management of chest tube   Pleural effusion on right   Acute respiratory failure with hypoxia (HCC)  Right pleural effusion, exudative with compressive atelectasis, with concern of pneumonia, likely related to microaspiration . Acute hypoxic respiratory failure -Management per PCCM, required chest tube placement 05/26/2022, lytic treatment given 05/27/2022 . -CT chest performed this morning, showing improvement of lower effusion, CT chest to be discontinued by [facility] 1/24 -Continue with Levaquin 14 days, -Difficult to guide to use incentive spirometry given Down syndrome/intellectual impairment -Follow with pulmonary as an outpatient to determine if antibiotic course need to be extended -2 L oxygen this morning, which has been discontinued, so far tolerating room air   OSA - CPAP   Schizoaffective disorder Dementia versus memory impairment Continue with home medications Initially more somnolent, this has improved after decreasing her trazodone from 200 to 100 mg at bedtime, as well I have stopped his IV pain medicine where he is more  awake and interactive today   History of hydrocephalus s/p VP shunt No acute findings   Morbid Obesity bowel movementBody mass index is 47.02 kg/m.  Right knee pain -No acute finding on imaging or physical exam    DVT prophylaxis: [place] Heparin Code Status: Full Family Communication: D/W mother  at bedside Disposition:   Status is: Inpatient    Consultants:  PCCM   Subjective:  No significant events overnight, patient unable to provide any complaints  Objective: Vitals:   05/30/22 0500 05/30/22 0600 05/30/22 0800 05/30/22 1229  BP:   (!) 85/63 121/66  Pulse:  63 (!) 54 78  Resp:  '12 15 15  '$ Temp:   98.6 F (37 C) 98.1 F (36.7 C)  TempSrc:   Axillary Oral  SpO2:  93% 94% 100%  Weight: 116.6 kg     Height:       No intake or output data in the 24 hours ending 05/30/22 1421  Filed Weights   05/28/22 0500 05/29/22 0500 05/30/22 0500  Weight: 117 kg 117.6 kg 116.6 kg    Examination:   Awake Alert, Down facies, frail, chronically ill-appearing  symmetrical Chest wall movement, Good air movement bilaterally, right lung Rales RRR,No Gallops,Rubs or new Murmurs, No Parasternal Heave +ve B.Sounds, Abd Soft, No tenderness, No rebound - guarding or rigidity. No Cyanosis, Clubbing or edema, No new Rash or bruise    .     Data Reviewed: I have personally reviewed following labs and imaging studies  CBC: Recent Labs  Lab 05/26/22 0046 05/26/22 0145 05/27/22 0039 05/28/22 0856 05/30/22 0429  WBC 14.5*  --  22.5* 10.7* 7.3  NEUTROABS 12.2*  --   --   --   --  HGB 15.5 15.6 13.8 13.3 13.2  HCT 46.8 46.0 41.8 41.0 40.5  MCV 96.1  --  97.9 99.0 98.8  PLT 325  --  277 257 016    Basic Metabolic Panel: Recent Labs  Lab 05/26/22 0046 05/26/22 0145 05/27/22 0039 05/28/22 0856 05/30/22 0429  NA 130* 132* 136 134* 139  K 3.6 4.6 4.8 4.5 4.2  CL 98  --  101 96* 98  CO2 24  --  30 32 33*  GLUCOSE 183*  --  134* 129* 99  BUN 16  --  '9 15 11  '$ CREATININE  1.21  --  1.03 1.07 0.91  CALCIUM 8.0*  --  8.4* 8.3* 8.5*  MG  --   --  2.1  --   --   PHOS  --   --  3.9  --   --     GFR: Estimated Creatinine Clearance: 126 mL/min (by C-G formula based on SCr of 0.91 mg/dL).  Liver Function Tests: Recent Labs  Lab 05/26/22 0046  AST 21  ALT 22  ALKPHOS 69  BILITOT 0.4  PROT 7.5  ALBUMIN 3.0*    CBG: Recent Labs  Lab 05/29/22 2039 05/30/22 0002 05/30/22 0348 05/30/22 0804 05/30/22 1233  GLUCAP 116* 109* 102* 116* 121*     Recent Results (from the past 240 hour(s))  Culture, blood (Routine x 2)     Status: None (Preliminary result)   Collection Time: 05/26/22 12:40 AM   Specimen: BLOOD RIGHT HAND  Result Value Ref Range Status   Specimen Description   Final    BLOOD RIGHT HAND Performed at [facility], [address] [place], [place] [address]    Special Requests   Final    BOTTLES DRAWN AEROBIC AND ANAEROBIC Blood Culture results may not be optimal due to an inadequate volume of blood received in culture bottles Performed at [facility], [address] [place], [place] [address]    Culture   Final    NO GROWTH 4 DAYS Performed at [facility], [place] [address] [place], [place] [address]    Report Status PENDING  Incomplete  Resp panel by RT-PCR (RSV, Flu A&B, Covid) Peripheral     Status: None   Collection Time: 05/26/22 12:40 AM   Specimen: Peripheral; Nasal Swab  Result Value Ref Range Status   SARS Coronavirus 2 by RT PCR NEGATIVE NEGATIVE Final    Comment: (NOTE) SARS-CoV-2 target nucleic acids are NOT DETECTED.  The SARS-CoV-2 RNA is generally detectable in upper respiratory specimens during the acute phase of infection. The lowest concentration of SARS-CoV-2 viral copies this assay can detect is 138 copies/mL. A negative result does not preclude SARS-Cov-2 infection and should not be used as the sole basis for treatment or other patient management decisions. A negative result may  occur with  improper specimen collection/handling, submission of specimen other than nasopharyngeal swab, presence of viral mutation(s) within the areas targeted by this assay, and inadequate number of viral copies(<138 copies/mL). A negative result must be combined with clinical observations, patient history, and epidemiological information. The expected result is Negative.  Fact Sheet for Patients:  EntrepreneurPulse.com.au  Fact Sheet for Healthcare Providers:  IncredibleEmployment.be  This test is no t yet approved or cleared by the [place] FDA and  has been authorized for detection and/or diagnosis of SARS-CoV-2 by FDA under an Emergency Use Authorization (EUA). This EUA will remain  in effect (meaning  this test can be used) for the duration of the COVID-19 declaration under Section 564(b)(1) of the Act, 21 U.S.C.section 360bbb-3(b)(1), unless the authorization is terminated  or revoked sooner.       Influenza A by PCR NEGATIVE NEGATIVE Final   Influenza B by PCR NEGATIVE NEGATIVE Final    Comment: (NOTE) The Xpert Xpress SARS-CoV-2/FLU/RSV plus assay is intended as an aid in the diagnosis of influenza from Nasopharyngeal swab specimens and should not be used as a sole basis for treatment. Nasal washings and aspirates are unacceptable for Xpert Xpress SARS-CoV-2/FLU/RSV testing.  Fact Sheet for Patients: EntrepreneurPulse.com.au  Fact Sheet for Healthcare Providers: IncredibleEmployment.be  This test is not yet approved or cleared by the [place] FDA and has been authorized for detection and/or diagnosis of SARS-CoV-2 by FDA under an Emergency Use Authorization (EUA). This EUA will remain in effect (meaning this test can be used) for the duration of the COVID-19 declaration under Section 564(b)(1) of the Act, 21 U.S.C. section 360bbb-3(b)(1), unless the authorization is terminated  or revoked.     Resp Syncytial Virus by PCR NEGATIVE NEGATIVE Final    Comment: (NOTE) Fact Sheet for Patients: EntrepreneurPulse.com.au  Fact Sheet for Healthcare Providers: IncredibleEmployment.be  This test is not yet approved or cleared by the [place] FDA and has been authorized for detection and/or diagnosis of SARS-CoV-2 by FDA under an Emergency Use Authorization (EUA). This EUA will remain in effect (meaning this test can be used) for the duration of the COVID-19 declaration under Section 564(b)(1) of the Act, 21 U.S.C. section 360bbb-3(b)(1), unless the authorization is terminated or revoked.  Performed at [facility], [address] [place], [place] [address]   Culture, blood (Routine x 2)     Status: None (Preliminary result)   Collection Time: 05/26/22  1:05 AM   Specimen: BLOOD LEFT WRIST  Result Value Ref Range Status   Specimen Description   Final    BLOOD LEFT WRIST Performed at [facility], [address] [place], [place] [address]    Special Requests   Final    BOTTLES DRAWN AEROBIC AND ANAEROBIC Blood Culture results may not be optimal due to an inadequate volume of blood received in culture bottles Performed at [facility], [address] [place], [place] [address]    Culture   Final    NO GROWTH 4 DAYS Performed at [facility], [place] [address] [place], [place] [address]    Report Status PENDING  Incomplete  MRSA Next Gen by PCR, Nasal     Status: Abnormal   Collection Time: 05/26/22  6:28 AM   Specimen: Nasal Mucosa; Nasal Swab  Result Value Ref Range Status   MRSA by PCR Next Gen DETECTED (A) NOT DETECTED Final    Comment: RESULT CALLED TO, READ BACK BY AND VERIFIED WITH: RN Rolland Porter [id number] AT 0858 BY EC (NOTE) The GeneXpert MRSA Assay (FDA approved for NASAL specimens only), is one component of a comprehensive MRSA colonization surveillance program. It is  not intended to diagnose MRSA infection nor to guide or monitor treatment for MRSA infections. Test performance is not FDA approved in patients less than 66 years old. Performed at [facility], [place] [address] [place], [place] [address]   Body fluid culture w Gram Stain     Status: None   Collection Time: 05/26/22  9:55 AM   Specimen: Pleural Fluid  Result Value  Ref Range Status   Specimen Description PLEURAL  Final   Special Requests RIGHT  Final   Gram Stain   Final    MODERATE WBC PRESENT, PREDOMINANTLY PMN NO ORGANISMS SEEN    Culture   Final    NO GROWTH 3 DAYS Performed at [facility], [address], [place], [place] [address]    Report Status 05/29/2022 FINAL  Final         Radiology Studies: DG CHEST PORT 1 VIEW  Result Date: 05/30/2022 CLINICAL DATA:  Provided history: Pleural effusion. EXAM: PORTABLE CHEST 1 VIEW COMPARISON:  Chest CT 05/29/2022. Prior chest radiographs 05/28/2022 and earlier. FINDINGS: Redemonstrated shunt fragment coursing inferiorly from the right neck and terminating at the level of the upper chest. A previously demonstrated right-sided chest tube is no longer present. The cardiomediastinal silhouette is unchanged. Persistent ill-defined opacities within the right middle and lower lobes, which may reflect atelectasis and/or pneumonia. Aeration of the right lung has improved elsewhere. However, there are persistent background vague opacities within the right lung which likely reflect re-expansion edema. No appreciable airspace consolidation on the left. Persistent small right pleural effusion and/or pleural thickening. No evidence of pneumothorax. IMPRESSION: 1. Persistent ill-defined opacities within the right middle and lower lobes, which may reflect atelectasis and/or pneumonia. 2. Persistent, although improved, re-expansion edema elsewhere within the right lung. 3. Persistent small right pleural effusion and/or pleural thickening. 4. No  evidence of pneumothorax status post right chest tube removal. Electronically Signed   By: Kellie Simmering D.O.   On: 05/30/2022 08:29   DG Knee Right Port  Result Date: 05/29/2022 CLINICAL DATA:  RIGHT knee pain EXAM: PORTABLE RIGHT KNEE - 1-2 VIEW COMPARISON:  None Available. FINDINGS: Osseous mineralization normal. Joint spaces preserved. No fracture, dislocation, or bone destruction. No joint effusion. IMPRESSION: Normal exam. Electronically Signed   By: Lavonia Dana M.D.   On: 05/29/2022 11:56   CT CHEST WO CONTRAST  Result Date: 05/29/2022 CLINICAL DATA:  Pleural effusion follow-up status post chest tube drainage. EXAM: CT CHEST WITHOUT CONTRAST TECHNIQUE: Multidetector CT imaging of the chest was performed following the standard protocol without IV contrast. RADIATION DOSE REDUCTION: This exam was performed according to the departmental dose-optimization program which includes automated exposure control, adjustment of the mA and/or kV according to patient size and/or use of iterative reconstruction technique. COMPARISON:  Chest x-ray from yesterday. FINDINGS: Cardiovascular: The heart is at the upper limits of normal in size. Trace pericardial effusion. No thoracic aortic aneurysm. Mediastinum/Nodes: No enlarged mediastinal or axillary lymph nodes. Thyroid gland, trachea, and esophagus demonstrate no significant findings. Lungs/Pleura: Posterior approach right-sided pigtail chest tube in place. Multiple side holes are extrapleural in location. Small residual loculated right pleural effusion with pleural thickening. Dense atelectasis versus consolidation in the lateral right middle lobe medial right lower lobe. Hazy ground-glass densities and scattered smooth interlobular septal thickening throughout much of the right lung. Trace left pleural effusion with subsegmental atelectasis in the left upper and lower lobes. Upper Abdomen: No acute abnormality. Musculoskeletal: No acute or significant osseous  findings. Multiple vertebral body hemangiomas. IMPRESSION: 1. Posterior approach right-sided pigtail chest tube in place. Multiple side holes are extrapleural in location. Consider replacement or removal. 2. Small residual loculated right pleural effusion with pleural thickening, suggestive of resolving empyema. 3. Dense atelectasis versus pneumonia in the lateral right middle lobe and medial right lower lobe. 4. Hazy ground-glass densities and scattered smooth interlobular septal thickening throughout much of the right  lung, likely residual re-expansion pulmonary edema. 5. Trace left pleural effusion. Electronically Signed   By: Titus Dubin M.D.   On: 05/29/2022 09:02        Scheduled Meds:  Chlorhexidine Gluconate Cloth  6 each Topical Daily   heparin  5,000 Units Subcutaneous Q8H   insulin aspart  1-3 Units Subcutaneous Q4H   levofloxacin  750 mg Oral Daily   melatonin  10 mg Oral QHS   paliperidone  6 mg Oral QHS   sodium chloride flush  10 mL Intrapleural Q8H   traZODone  100 mg Oral QHS   Continuous Infusions:  sodium chloride Stopped (05/26/22 1751)     LOS: 4 days       Phillips Climes, MD [facility]   To contact the attending provider between 7A-7P or the covering provider during after hours 7P-7A, please log into the web site [email or web address] and access using universal [place] password for that web site. If you do not have the password, please call the hospital operator.  05/30/2022, 2:21 PM

## 2022-05-30 NOTE — Progress Notes (Signed)
Patient was up in the recliner sitting today for approximately 2 to 3 hours.  Dad is currently in room with patient.  Patient is in the bed with 2L [place] due to beginning to desat at 87 on room air.

## 2022-05-30 NOTE — Progress Notes (Signed)
NAME:  Sergio [name], MRN:  [id number], DOB:  November 08, 1985, LOS: 4 ADMISSION DATE:  05/26/2022 CONSULTATION DATE:  05/26/2022 REFERRING MD:  Sedonia Small - EDP CHIEF COMPLAINT:  Respiratory distress, large R pleural effusion   History of Present Illness:  37 year old man who presented to [facility] 1/21 for SOB and fever. Received neb x 2 without relief. Evaluated at [facility], found to have large R pleural effusion and transferred to [facility] ED for higher level of care. PMHx significant for Down syndrome, OSA, GERD with dysphagia, hydrocephalus (s/p VP shunt placement), pituitary adenoma, schizoaffective disorder, dementia/memory impairment.  History is obtained from chart and from patient's father, at bedside. Patient's father states that they were eating dinner the evening prior to admission when patient was noted to have audible wheezing. Appetite was down which was abnormal for patient. +Fever on temperature check. They administered a nebulizer treatment without any improvement. Patient's father states that he has had breathing issues in the remote past, but they thought he had "grown out of it" since it had been several years since patient had any issues with his breathing. Presented to [facility] for evaluation.  On evaluation at [facility], patient was febrile to 100.80F, mildly tachycardic, normotensive. SpO2 initially 87%, improved with supplemental O2. Labs notable for WBC 14.5, Hgb/Plt WNL, Na 130, K 3.6, Cr 1.21 (near baseline), normal LFTs. COVID/Flu/RSV negative. CXR with large R pleural effusion and compressive atelectasis with collapse of RML/RLL. Broad-spectrum Levaquin/Vanc started. Decision made to transfer patient to [facility] ED for higher level of care/additional pulmonary and anesthesia services, should respiratory status decline.   PCCM consulted for ICU admission and intervention.  Pertinent Medical History:   Past Medical History:  Diagnosis Date   Dementia ([place])    Difficulty swallowing    Down's syndrome     Dysphagia causing pulmonary aspiration with swallowing    GERD (gastroesophageal reflux disease)    Headache(784.0)    Hydrocephalus ([facility])    Insomnia    Memory loss    OSA (obstructive sleep apnea)    Pituitary adenoma ([place])    Schizoaffective disorder    Weakness    Significant Hospital Events: Including procedures, antibiotic start and stop dates in addition to other pertinent events   1/21 - Presented to [facility] for SOB, fever. CXR with large R pleural effusion, compressive atelectasis. Broad-spectrum antibiotics started. Nebs administered and placed on HFNC with improvement in sats. Transferred to [facility] for higher level of care (additional pulmonary/anesthesia resources). Bedside POCUS with large R fluid pocket noted on R posterior aspect of thorax. Chest tube placed and 1st dose of pleural lytics given. 1/22 second dose pleural lytics given  Interim History / Subjective:   Cxr shows small right pleural effusion vs pleural thickening. He is feeling much better with chest tube out.   Objective:  Blood pressure (!) 85/63, pulse (!) 54, temperature 98.6 F (37 C), temperature source Axillary, resp. rate 15, height '5\' 2"'$  (1.575 m), weight 116.6 kg, SpO2 94 %.       No intake or output data in the 24 hours ending 05/30/22 1058  Filed Weights   05/28/22 0500 05/29/22 0500 05/30/22 0500  Weight: 117 kg 117.6 kg 116.6 kg   Physical Examination: General: 37 year old Down syndrome male in no acute distress HEENT: MM pink/moist no JVD is appreciated Neuro: Follows simple commands CV: Heart sounds are regular PULM: diminished, no wheezing Scant drainage GI: soft, bsx4 active  Extremities: warm/dry, no edema  Skin: no rashes or lesions  Resolved Hospital Problem List:    Assessment & Plan:  Large R pleural effusion, exudative with compressive atelectasis Concern for PNA  Chest tube placed 05/26/2022 with lytics given 1/21 and 1/22  Chest tube removed 1/24 after CT Chest  showed significant resolution of pleural effusion with small right effusion remaining and minimal output. Follow culture data Continue levaquin PO for a total of 14 days. Will determine need for extended course at clinic follow up   Acute hypoxemic respiratory failure History of OSA - Continue supplemental O2 support; suspect needs will decrease significantly once pleural effusion is evacuated - Wean O2 for sat > 90% - Bronchodilators PRN (DuoNebs) - Pulmonary hygiene - PT/OT  Schizoaffective disorder Dementia versus memory impairment Continue home medications  History of hydrocephalus s/p VP shunt Aware  Morbid Obesity BMI 47  PCCM will sign off, please call with any further questions.  Best Practice: (right click and "Reselect all SmartList Selections" daily)   Per primary  Labs:  CBC: Recent Labs  Lab 05/26/22 0046 05/26/22 0145 05/27/22 0039 05/28/22 0856 05/30/22 0429  WBC 14.5*  --  22.5* 10.7* 7.3  NEUTROABS 12.2*  --   --   --   --   HGB 15.5 15.6 13.8 13.3 13.2  HCT 46.8 46.0 41.8 41.0 40.5  MCV 96.1  --  97.9 99.0 98.8  PLT 325  --  277 257 229   Basic Metabolic Panel: Recent Labs  Lab 05/26/22 0046 05/26/22 0145 05/27/22 0039 05/28/22 0856 05/30/22 0429  NA 130* 132* 136 134* 139  K 3.6 4.6 4.8 4.5 4.2  CL 98  --  101 96* 98  CO2 24  --  30 32 33*  GLUCOSE 183*  --  134* 129* 99  BUN 16  --  '9 15 11  '$ CREATININE 1.21  --  1.03 1.07 0.91  CALCIUM 8.0*  --  8.4* 8.3* 8.5*  MG  --   --  2.1  --   --   PHOS  --   --  3.9  --   --    GFR: Estimated Creatinine Clearance: 126 mL/min (by C-G formula based on SCr of 0.91 mg/dL). Recent Labs  Lab 05/26/22 0046 05/27/22 0039 05/28/22 0856 05/30/22 0429  PROCALCITON  --  0.22 0.11  --   WBC 14.5* 22.5* 10.7* 7.3  LATICACIDVEN 1.5  --   --   --    Liver Function Tests: Recent Labs  Lab 05/26/22 0046  AST 21  ALT 22  ALKPHOS 69  BILITOT 0.4  PROT 7.5  ALBUMIN 3.0*   No results for  input(s): "LIPASE", "AMYLASE" in the last 168 hours. No results for input(s): "AMMONIA" in the last 168 hours.  ABG:    Component Value Date/Time   PHART 7.350 03/18/2010 0844   PCO2ART 46.3 (H) 03/18/2010 0844   PO2ART 57.0 (L) 03/18/2010 0844   HCO3 29.1 (H) 05/26/2022 0145   TCO2 30 05/26/2022 0145   O2SAT 94 05/26/2022 0145    Coagulation Profile: Recent Labs  Lab 05/26/22 0046  INR 1.1   Cardiac Enzymes: No results for input(s): "CKTOTAL", "CKMB", "CKMBINDEX", "TROPONINI" in the last 168 hours.  HbA1C: HB A1C (BAYER DCA - WAIVED)  Date/Time Value Ref Range Status  03/05/2021 04:19 PM 5.5 4.8 - 5.6 % Final    Comment:             Prediabetes: 5.7 - 6.4          Diabetes: >  6.4          Glycemic control for adults with diabetes: <7.0    Hgb A1c MFr Bld  Date/Time Value Ref Range Status  05/27/2022 12:39 AM 5.5 4.8 - 5.6 % Final    Comment:    (NOTE) Pre diabetes:          5.7%-6.4%  Diabetes:              >6.4%  Glycemic control for   <7.0% adults with diabetes    CBG: Recent Labs  Lab 05/29/22 1546 05/29/22 2039 05/30/22 0002 05/30/22 0348 05/30/22 0804  GLUCAP 97 116* 109* 102* 116*      Critical care time: n/a   Freda Jackson, MD [facility] Office: [phone]   See Amion for personal pager PCCM on call pager [phone] until 7pm. Please call Elink 7p-7a. [phone]

## 2022-05-31 ENCOUNTER — Other Ambulatory Visit (HOSPITAL_COMMUNITY): Payer: Self-pay

## 2022-05-31 DIAGNOSIS — J90 Pleural effusion, not elsewhere classified: Secondary | ICD-10-CM | POA: Diagnosis not present

## 2022-05-31 DIAGNOSIS — J9601 Acute respiratory failure with hypoxia: Secondary | ICD-10-CM | POA: Diagnosis not present

## 2022-05-31 LAB — CULTURE, BLOOD (ROUTINE X 2)
Culture: NO GROWTH
Culture: NO GROWTH

## 2022-05-31 LAB — GLUCOSE, CAPILLARY
Glucose-Capillary: 105 mg/dL — ABNORMAL HIGH (ref 70–99)
Glucose-Capillary: 107 mg/dL — ABNORMAL HIGH (ref 70–99)
Glucose-Capillary: 117 mg/dL — ABNORMAL HIGH (ref 70–99)

## 2022-05-31 MED ORDER — LEVOFLOXACIN 750 MG PO TABS: 750.0000 mg | ORAL_TABLET | Freq: Every day | ORAL | 0 refills | Status: DC

## 2022-05-31 MED ORDER — LEVOFLOXACIN 750 MG PO TABS
750.0000 mg | ORAL_TABLET | Freq: Every day | ORAL | 0 refills | Status: DC
  Filled 2022-05-31: qty 9, 9d supply, fill #0

## 2022-05-31 NOTE — Discharge Summary (Signed)
Physician Discharge Summary  Sergio [name] WUJ:[id number] DOB: 08/29/1985 DOA: 05/26/2022  PCP: Janora Norlander, DO  Admit date: 05/26/2022 Discharge date: 05/31/2022  Admitted From: (Home) Disposition:  (Home )  Recommendations for Outpatient Follow-up:  Follow up with PCP in 1-2 weeks Patient to follow-up with PCCM as an outpatient regarding repeat chest x-ray and final decision about length of antibiotic treatment.  Home Health: (YES)   Discharge Condition: (Stable) CODE STATUS: (FULL) Diet recommendation: Regular  Brief/Interim Summary:  36 year old man who presented to [facility] 1/21 for SOB and fever. Received neb x 2 without relief. Evaluated at [facility], found to have large R pleural effusion and transferred to [facility] ED for higher level of care. PMHx significant for Down syndrome, OSA, GERD with dysphagia, hydrocephalus (s/p VP shunt placement), pituitary adenoma, schizoaffective disorder, dementia/memory impairment, his workup significant for right loculated pleural effusion, status post chest tube insertion and multiple lytics treatment, transferred to progressive unit[facility] 1/24 .  Pleural effusion continues to improve, chest tube discontinued with improving x-ray, with plan to discharge home with oral antibiotic and follow-up with pulmonary as an outpatient   Right pleural effusion, exudative with compressive atelectasis, with concern of pneumonia, likely related to microaspiration . Acute hypoxic respiratory failure -Management per PCCM, required chest tube placement 05/26/2022, lytic treatment given 05/27/2022 , CT chest performed , showing improvement of  effusion, Chest tube discontinued by [facility] 1/24, patient to treated for total 14 days of antibiotics, he will be discharged on p.o. Levaquin for another 9 days, and to follow with pulmonary as an outpatient regarding repeat imaging and decision to adjust length of treatment if needed. -Difficult to guide to use incentive  spirometry given Down syndrome/intellectual impairment   OSA - CPAP   Schizoaffective disorder Dementia versus memory impairment Continue with home medications  History of hydrocephalus s/p VP shunt No acute findings   Morbid Obesity bowel movementBody mass index is 47.02 kg/m.   Right knee pain -No acute finding on imaging or physical exam    Discharge Diagnoses:  Principal Problem:   Acute hypoxemic respiratory failure (HCC) Active Problems:   Need for management of chest tube   Pleural effusion on right   Acute respiratory failure with hypoxia [facility])    Discharge Instructions  Discharge Instructions     Diet - low sodium heart healthy   Complete by: As directed    Increase activity slowly   Complete by: As directed    No wound care   Complete by: As directed       Allergies as of 05/31/2022       Reactions   Cefaclor Anaphylaxis, Shortness Of Breath   Penicillins Anaphylaxis, Rash   Has patient had a PCN reaction causing immediate rash, facial/tongue/throat swelling, SOB or lightheadedness with hypotension: yes Has patient had a PCN reaction causing severe rash involving mucus membranes or skin necrosis: no Has patient had a PCN reaction that required hospitalization: no Has patient had a PCN reaction occurring within the last 10 years: no If all of the above answers are "NO", then may proceed with Cephalosporin use.   Clindamycin Rash   Clindamycin/lincomycin Rash        Medication List     STOP taking these medications    hydrOXYzine 50 MG capsule Commonly known as: VISTARIL       TAKE these medications    ipratropium-albuterol 0.5-2.5 (3) MG/3ML Soln Commonly known as: DUONEB NEBULIZE 1 VIAL EVERY 6 HOURS AS NEEDED  FOR WHEEZING OR SHORTNESS OF BREATH What changed: See the new instructions.   ketoconazole 2 % cream Commonly known as: NIZORAL Apply topically daily as needed for irritation (rash in groin). X14 days per flare until  cleared What changed:  how much to take when to take this   ketoconazole 2 % shampoo Commonly known as: NIZORAL APPLY TOPICALLY 2 TIMES A WEEK What changed: See the new instructions.   levofloxacin 750 MG tablet Commonly known as: LEVAQUIN Take 1 tablet (750 mg total) by mouth daily.   Melatonin 10 MG Tabs Take 10 mg by mouth at bedtime.   metroNIDAZOLE 0.75 % cream Commonly known as: METROCREAM APPLY TO THE AFFECTED AREA(S) TWICE DAILY FOR ROSACEA OF FACE   paliperidone 6 MG 24 hr tablet Commonly known as: INVEGA Take 1 tablet (6 mg total) by mouth at bedtime.   traZODone 100 MG tablet Commonly known as: DESYREL Take 2 tablets (200 mg total) by mouth at bedtime.   Vitamin D (Ergocalciferol) 1.25 MG (50000 UNIT) Caps capsule Commonly known as: DRISDOL Take 1 capsule (50,000 Units total) by mouth every 7 (seven) days. What changed: additional instructions               Durable Medical Equipment  (From admission, onward)           Start     Ordered   05/31/22 0650  For home use only DME oxygen  Once       Question Answer Comment  Length of Need 6 Months   Mode or (Route) Nasal cannula   Liters per Minute 2   Frequency Continuous (stationary and portable oxygen unit needed)   Oxygen conserving device Yes   Oxygen delivery system Gas      05/31/22 0649            Allergies  Allergen Reactions   Cefaclor Anaphylaxis and Shortness Of Breath   Penicillins Anaphylaxis and Rash    Has patient had a PCN reaction causing immediate rash, facial/tongue/throat swelling, SOB or lightheadedness with hypotension: yes Has patient had a PCN reaction causing severe rash involving mucus membranes or skin necrosis: no Has patient had a PCN reaction that required hospitalization: no Has patient had a PCN reaction occurring within the last 10 years: no If all of the above answers are "NO", then may proceed with Cephalosporin use.    Clindamycin Rash    Clindamycin/Lincomycin Rash    Consultations: PCCM   Procedures/Studies: DG CHEST PORT 1 VIEW  Result Date: 05/30/2022 CLINICAL DATA:  Provided history: Pleural effusion. EXAM: PORTABLE CHEST 1 VIEW COMPARISON:  Chest CT 05/29/2022. Prior chest radiographs 05/28/2022 and earlier. FINDINGS: Redemonstrated shunt fragment coursing inferiorly from the right neck and terminating at the level of the upper chest. A previously demonstrated right-sided chest tube is no longer present. The cardiomediastinal silhouette is unchanged. Persistent ill-defined opacities within the right middle and lower lobes, which may reflect atelectasis and/or pneumonia. Aeration of the right lung has improved elsewhere. However, there are persistent background vague opacities within the right lung which likely reflect re-expansion edema. No appreciable airspace consolidation on the left. Persistent small right pleural effusion and/or pleural thickening. No evidence of pneumothorax. IMPRESSION: 1. Persistent ill-defined opacities within the right middle and lower lobes, which may reflect atelectasis and/or pneumonia. 2. Persistent, although improved, re-expansion edema elsewhere within the right lung. 3. Persistent small right pleural effusion and/or pleural thickening. 4. No evidence of pneumothorax status post right chest tube removal. Electronically  Signed   By: Kellie Simmering D.O.   On: 05/30/2022 08:29   DG Knee Right Port  Result Date: 05/29/2022 CLINICAL DATA:  RIGHT knee pain EXAM: PORTABLE RIGHT KNEE - 1-2 VIEW COMPARISON:  None Available. FINDINGS: Osseous mineralization normal. Joint spaces preserved. No fracture, dislocation, or bone destruction. No joint effusion. IMPRESSION: Normal exam. Electronically Signed   By: Lavonia Dana M.D.   On: 05/29/2022 11:56   CT CHEST WO CONTRAST  Result Date: 05/29/2022 CLINICAL DATA:  Pleural effusion follow-up status post chest tube drainage. EXAM: CT CHEST WITHOUT CONTRAST  TECHNIQUE: Multidetector CT imaging of the chest was performed following the standard protocol without IV contrast. RADIATION DOSE REDUCTION: This exam was performed according to the departmental dose-optimization program which includes automated exposure control, adjustment of the mA and/or kV according to patient size and/or use of iterative reconstruction technique. COMPARISON:  Chest x-ray from yesterday. FINDINGS: Cardiovascular: The heart is at the upper limits of normal in size. Trace pericardial effusion. No thoracic aortic aneurysm. Mediastinum/Nodes: No enlarged mediastinal or axillary lymph nodes. Thyroid gland, trachea, and esophagus demonstrate no significant findings. Lungs/Pleura: Posterior approach right-sided pigtail chest tube in place. Multiple side holes are extrapleural in location. Small residual loculated right pleural effusion with pleural thickening. Dense atelectasis versus consolidation in the lateral right middle lobe medial right lower lobe. Hazy ground-glass densities and scattered smooth interlobular septal thickening throughout much of the right lung. Trace left pleural effusion with subsegmental atelectasis in the left upper and lower lobes. Upper Abdomen: No acute abnormality. Musculoskeletal: No acute or significant osseous findings. Multiple vertebral body hemangiomas. IMPRESSION: 1. Posterior approach right-sided pigtail chest tube in place. Multiple side holes are extrapleural in location. Consider replacement or removal. 2. Small residual loculated right pleural effusion with pleural thickening, suggestive of resolving empyema. 3. Dense atelectasis versus pneumonia in the lateral right middle lobe and medial right lower lobe. 4. Hazy ground-glass densities and scattered smooth interlobular septal thickening throughout much of the right lung, likely residual re-expansion pulmonary edema. 5. Trace left pleural effusion. Electronically Signed   By: Titus Dubin M.D.   On:  05/29/2022 09:02   DG CHEST PORT 1 VIEW  Result Date: 05/28/2022 CLINICAL DATA:  Pleural effusion. EXAM: PORTABLE CHEST 1 VIEW COMPARISON:  AP chest 122 2024, 05/26/2022 FINDINGS: Ventricular shunt catheter fragment again overlies the right neck and superior right hemithorax. Right basilar pleural drainage catheter appears now estimated changed in position on single frontal view. Marked interval improvement in the prior moderate to large right pleural effusion, now with minimal fluid at the right hemidiaphragm and likely within right minor fissure. No definite left pleural effusion. Moderate bilateral interstitial thickening is similar to prior. Cardiac silhouette is again moderately enlarged. Mediastinal contours are not well evaluated due to AP technique and low lung volumes. No pneumothorax is seen. No acute skeletal abnormality. IMPRESSION: 1. Marked interval improvement in the prior moderate to large right pleural effusion, now with minimal fluid overlying the right hemidiaphragm and likely within the right minor fissure. 2. Moderate bilateral interstitial thickening is similar to prior. Electronically Signed   By: Yvonne Kendall M.D.   On: 05/28/2022 08:22   VAS [place] LOWER EXTREMITY VENOUS (DVT)  Result Date: 05/27/2022  Lower Venous DVT Study Patient Name:  Sergio [name]  Date of Exam:   05/27/2022 Medical Rec #: [id number]           Accession #:    [id number] Date of Birth: 1986-04-08  Patient Gender: M Patient Age:   74 years Exam Location:  [facility] Procedure:      VAS [place] LOWER EXTREMITY VENOUS (DVT) Referring Phys: Noemi Chapel --------------------------------------------------------------------------------  Indications: Pain.  Comparison Study: no prior Performing Technologist: Archie Patten RVS  Examination Guidelines: A complete evaluation includes B-mode imaging, spectral Doppler, color Doppler, and power Doppler as needed of all accessible portions of each vessel.  Bilateral testing is considered an integral part of a complete examination. Limited examinations for reoccurring indications may be performed as noted. The reflux portion of the exam is performed with the patient in reverse Trendelenburg.  +---------+---------------+---------+-----------+----------+--------------+ RIGHT    CompressibilityPhasicitySpontaneityPropertiesThrombus Aging +---------+---------------+---------+-----------+----------+--------------+ CFV      Full           Yes      Yes                                 +---------+---------------+---------+-----------+----------+--------------+ SFJ      Full                                                        +---------+---------------+---------+-----------+----------+--------------+ FV Prox  Full                                                        +---------+---------------+---------+-----------+----------+--------------+ FV Mid   Full                                                        +---------+---------------+---------+-----------+----------+--------------+ FV DistalFull                                                        +---------+---------------+---------+-----------+----------+--------------+ PFV      Full                                                        +---------+---------------+---------+-----------+----------+--------------+ POP      Full           Yes      Yes                                 +---------+---------------+---------+-----------+----------+--------------+ PTV      Full                                                        +---------+---------------+---------+-----------+----------+--------------+ PERO  Full                                                        +---------+---------------+---------+-----------+----------+--------------+   +---------+---------------+---------+-----------+----------+--------------+ LEFT      CompressibilityPhasicitySpontaneityPropertiesThrombus Aging +---------+---------------+---------+-----------+----------+--------------+ CFV      Full           Yes      Yes                                 +---------+---------------+---------+-----------+----------+--------------+ SFJ      Full                                                        +---------+---------------+---------+-----------+----------+--------------+ FV Prox  Full                                                        +---------+---------------+---------+-----------+----------+--------------+ FV Mid   Full                                                        +---------+---------------+---------+-----------+----------+--------------+ FV DistalFull                                                        +---------+---------------+---------+-----------+----------+--------------+ PFV      Full                                                        +---------+---------------+---------+-----------+----------+--------------+ POP      Full           Yes      Yes                                 +---------+---------------+---------+-----------+----------+--------------+ PTV      Full                                                        +---------+---------------+---------+-----------+----------+--------------+ PERO     Full                                                        +---------+---------------+---------+-----------+----------+--------------+  Summary: BILATERAL: - No evidence of deep vein thrombosis seen in the lower extremities, bilaterally. -No evidence of popliteal cyst, bilaterally.   *See table(s) above for measurements and observations. Electronically signed by Servando Snare MD on 05/27/2022 at 7:49:20 PM.    Final    DG Chest Port 1 View  Result Date: 05/27/2022 CLINICAL DATA:  Right pleural effusion. EXAM: PORTABLE CHEST 1 VIEW COMPARISON:  05/26/2022.  FINDINGS: 0438 hours. Slight repositioning of a right pleural drainage catheter with unchanged moderate to large right pleural effusion. Shunt catheter tubing again projects over the right neck and upper chest. Low lung volumes accentuate the cardiomediastinal silhouette and pulmonary vasculature. Trace left pleural effusion. IMPRESSION: Unchanged moderate to large right pleural effusion with slight repositioning of the right pleural drainage catheter. Electronically Signed   By: Emmit Alexanders M.D.   On: 05/27/2022 07:35   DG CHEST PORT 1 VIEW  Result Date: 05/26/2022 CLINICAL DATA:  Pleural effusion. EXAM: PORTABLE CHEST 1 VIEW COMPARISON:  05/26/2022, earlier same day FINDINGS: 1016 hours. Interval placement of a right pleural drain with no substantial change in the large right pleural effusion. Atelectasis again noted right upper lung. Left lung clear. The cardio pericardial silhouette is enlarged. Retained shunt catheter tubing evident. IMPRESSION: Interval placement of a right pleural drain with no substantial change in the large right pleural effusion. Electronically Signed   By: Misty Stanley M.D.   On: 05/26/2022 10:25   DG Chest Port 1 View  Result Date: 05/26/2022 CLINICAL DATA:  Dyspnea, fever, wheezing EXAM: PORTABLE CHEST 1 VIEW COMPARISON:  05/22/2017 FINDINGS: Retained ventricular shunt catheter fragment noted overlying the right neck base and right apex. Large right pleural effusion has developed with compressive atelectasis of the right upper lobe and collapse of the right middle and lower lobes. No pneumothorax. Left lung is clear. No pleural effusion on the left. Cardiac size within normal limits. No acute bone abnormality. IMPRESSION: 1. Large right pleural effusion with compressive atelectasis of the right upper lobe and collapse of the right middle and lower lobes. Electronically Signed   By: Fidela Salisbury M.D.   On: 05/26/2022 01:09      Subjective:  No significant events  overnight as discussed with staff and mother at bedside, patient with good appetite, had good BM yesterday, no significant events. Discharge Exam: Vitals:   05/31/22 0600 05/31/22 0800  BP:  97/64  Pulse: 63 (!) 59  Resp: 16 15  Temp:  98.8 F (37.1 C)  SpO2: 93% 93%   Vitals:   05/31/22 0400 05/31/22 0500 05/31/22 0600 05/31/22 0800  BP: (!) 109/55   97/64  Pulse: 69 65 63 (!) 59  Resp: '13 15 16 15  '$ Temp:    98.8 F (37.1 C)  TempSrc:    Axillary  SpO2: (!) 89% 95% 93% 93%  Weight:  116.6 kg    Height:        General: Pt is alert, awake, not in acute distress, pleasant, with Down's facies Cardiovascular: RRR, S1/S2 +, no rubs, no gallops Respiratory: Improved air entry in the right lung. Abdominal: Soft, NT, ND, bowel sounds + Extremities: no edema, no cyanosis    The results of significant diagnostics from this hospitalization (including imaging, microbiology, ancillary and laboratory) are listed below for reference.     Microbiology: Recent Results (from the past 240 hour(s))  Culture, blood (Routine x 2)     Status: None (Preliminary result)   Collection Time: 05/26/22 12:40 AM  Specimen: BLOOD RIGHT HAND  Result Value Ref Range Status   Specimen Description   Final    BLOOD RIGHT HAND Performed at [facility], [address] [place], [place] [address]    Special Requests   Final    BOTTLES DRAWN AEROBIC AND ANAEROBIC Blood Culture results may not be optimal due to an inadequate volume of blood received in culture bottles Performed at [facility], [address] [place], [place] [address]    Culture   Final    NO GROWTH 4 DAYS Performed at [facility], [place] [address], [place], [place] [address]    Report Status PENDING  Incomplete  Resp panel by RT-PCR (RSV, Flu A&B, Covid) Peripheral     Status: None   Collection Time: 05/26/22 12:40 AM   Specimen: Peripheral; Nasal Swab  Result Value Ref Range Status   SARS Coronavirus  2 by RT PCR NEGATIVE NEGATIVE Final    Comment: (NOTE) SARS-CoV-2 target nucleic acids are NOT DETECTED.  The SARS-CoV-2 RNA is generally detectable in upper respiratory specimens during the acute phase of infection. The lowest concentration of SARS-CoV-2 viral copies this assay can detect is 138 copies/mL. A negative result does not preclude SARS-Cov-2 infection and should not be used as the sole basis for treatment or other patient management decisions. A negative result may occur with  improper specimen collection/handling, submission of specimen other than nasopharyngeal swab, presence of viral mutation(s) within the areas targeted by this assay, and inadequate number of viral copies(<138 copies/mL). A negative result must be combined with clinical observations, patient history, and epidemiological information. The expected result is Negative.  Fact Sheet for Patients:  EntrepreneurPulse.com.au  Fact Sheet for Healthcare Providers:  IncredibleEmployment.be  This test is no t yet approved or cleared by the [place] FDA and  has been authorized for detection and/or diagnosis of SARS-CoV-2 by FDA under an Emergency Use Authorization (EUA). This EUA will remain  in effect (meaning this test can be used) for the duration of the COVID-19 declaration under Section 564(b)(1) of the Act, 21 U.S.C.section 360bbb-3(b)(1), unless the authorization is terminated  or revoked sooner.       Influenza A by PCR NEGATIVE NEGATIVE Final   Influenza B by PCR NEGATIVE NEGATIVE Final    Comment: (NOTE) The Xpert Xpress SARS-CoV-2/FLU/RSV plus assay is intended as an aid in the diagnosis of influenza from Nasopharyngeal swab specimens and should not be used as a sole basis for treatment. Nasal washings and aspirates are unacceptable for Xpert Xpress SARS-CoV-2/FLU/RSV testing.  Fact Sheet for Patients: EntrepreneurPulse.com.au  Fact  Sheet for Healthcare Providers: IncredibleEmployment.be  This test is not yet approved or cleared by the [place] FDA and has been authorized for detection and/or diagnosis of SARS-CoV-2 by FDA under an Emergency Use Authorization (EUA). This EUA will remain in effect (meaning this test can be used) for the duration of the COVID-19 declaration under Section 564(b)(1) of the Act, 21 U.S.C. section 360bbb-3(b)(1), unless the authorization is terminated or revoked.     Resp Syncytial Virus by PCR NEGATIVE NEGATIVE Final    Comment: (NOTE) Fact Sheet for Patients: EntrepreneurPulse.com.au  Fact Sheet for Healthcare Providers: IncredibleEmployment.be  This test is not yet approved or cleared by the [place] FDA and has been authorized for detection and/or diagnosis of SARS-CoV-2 by FDA under an Emergency Use Authorization (EUA). This EUA will remain in effect (meaning this test can be used) for  the duration of the COVID-19 declaration under Section 564(b)(1) of the Act, 21 U.S.C. section 360bbb-3(b)(1), unless the authorization is terminated or revoked.  Performed at [facility], [address] [place], [place] [address]   Culture, blood (Routine x 2)     Status: None (Preliminary result)   Collection Time: 05/26/22  1:05 AM   Specimen: BLOOD LEFT WRIST  Result Value Ref Range Status   Specimen Description   Final    BLOOD LEFT WRIST Performed at [facility], [address] [place], [place] [address]    Special Requests   Final    BOTTLES DRAWN AEROBIC AND ANAEROBIC Blood Culture results may not be optimal due to an inadequate volume of blood received in culture bottles Performed at [facility], [address] [place], [place] [address]    Culture   Final    NO GROWTH 4 DAYS Performed at [facility], [place] [address], [place], [place] [address]    Report Status PENDING   Incomplete  MRSA Next Gen by PCR, Nasal     Status: Abnormal   Collection Time: 05/26/22  6:28 AM   Specimen: Nasal Mucosa; Nasal Swab  Result Value Ref Range Status   MRSA by PCR Next Gen DETECTED (A) NOT DETECTED Final    Comment: RESULT CALLED TO, READ BACK BY AND VERIFIED WITH: RN Rolland Porter [id number] AT 0858 BY EC (NOTE) The GeneXpert MRSA Assay (FDA approved for NASAL specimens only), is one component of a comprehensive MRSA colonization surveillance program. It is not intended to diagnose MRSA infection nor to guide or monitor treatment for MRSA infections. Test performance is not FDA approved in patients less than 31 years old. Performed at [facility], [place] [address], [place], [place] [address]   Body fluid culture w Gram Stain     Status: None   Collection Time: 05/26/22  9:55 AM   Specimen: Pleural Fluid  Result Value Ref Range Status   Specimen Description PLEURAL  Final   Special Requests RIGHT  Final   Gram Stain   Final    MODERATE WBC PRESENT, PREDOMINANTLY PMN NO ORGANISMS SEEN    Culture   Final    NO GROWTH 3 DAYS Performed at [facility], [place] [address] [address], [place], [place] [address]    Report Status 05/29/2022 FINAL  Final     Labs: BNP (last 3 results) No results for input(s): "BNP" in the last 8760 hours. Basic Metabolic Panel: Recent Labs  Lab 05/26/22 0046 05/26/22 0145 05/27/22 0039 05/28/22 0856 05/30/22 0429  NA 130* 132* 136 134* 139  K 3.6 4.6 4.8 4.5 4.2  CL 98  --  101 96* 98  CO2 24  --  30 32 33*  GLUCOSE 183*  --  134* 129* 99  BUN 16  --  '9 15 11  '$ CREATININE 1.21  --  1.03 1.07 0.91  CALCIUM 8.0*  --  8.4* 8.3* 8.5*  MG  --   --  2.1  --   --   PHOS  --   --  3.9  --   --    Liver Function Tests: Recent Labs  Lab 05/26/22 0046  AST 21  ALT 22  ALKPHOS 69  BILITOT 0.4  PROT 7.5  ALBUMIN 3.0*   No results for input(s): "LIPASE", "AMYLASE" in the last 168 hours. No results for input(s): "AMMONIA" in the  last 168 hours. CBC: Recent Labs  Lab 05/26/22 0046 05/26/22 0145 05/27/22 0039 05/28/22 0856 05/30/22 0429  WBC 14.5*  --  22.5* 10.7* 7.3  NEUTROABS 12.2*  --   --   --   --   HGB 15.5 15.6 13.8 13.3 13.2  HCT 46.8 46.0 41.8 41.0 40.5  MCV 96.1  --  97.9 99.0 98.8  PLT 325  --  277 257 281   Cardiac Enzymes: No results for input(s): "CKTOTAL", "CKMB", "CKMBINDEX", "TROPONINI" in the last 168 hours. BNP: Invalid input(s): "POCBNP" CBG: Recent Labs  Lab 05/30/22 1707 05/30/22 2058 05/31/22 0031 05/31/22 0414 05/31/22 0811  GLUCAP 118* 104* 107* 117* 105*   D-Dimer No results for input(s): "DDIMER" in the last 72 hours. Hgb A1c No results for input(s): "HGBA1C" in the last 72 hours. Lipid Profile No results for input(s): "CHOL", "HDL", "LDLCALC", "TRIG", "CHOLHDL", "LDLDIRECT" in the last 72 hours. Thyroid function studies No results for input(s): "TSH", "T4TOTAL", "T3FREE", "THYROIDAB" in the last 72 hours.  Invalid input(s): "FREET3" Anemia work up No results for input(s): "VITAMINB12", "FOLATE", "FERRITIN", "TIBC", "IRON", "RETICCTPCT" in the last 72 hours. Urinalysis    Component Value Date/Time   COLORURINE YELLOW 05/26/2022 0953   APPEARANCEUR HAZY (A) 05/26/2022 0953   LABSPEC 1.030 05/26/2022 0953   PHURINE 5.0 05/26/2022 0953   GLUCOSEU NEGATIVE 05/26/2022 0953   HGBUR NEGATIVE 05/26/2022 0953   BILIRUBINUR NEGATIVE 05/26/2022 0953   KETONESUR NEGATIVE 05/26/2022 0953   PROTEINUR 30 (A) 05/26/2022 0953   UROBILINOGEN 0.2 07/02/2012 1244   NITRITE NEGATIVE 05/26/2022 0953   LEUKOCYTESUR NEGATIVE 05/26/2022 0953   Sepsis Labs Recent Labs  Lab 05/26/22 0046 05/27/22 0039 05/28/22 0856 05/30/22 0429  WBC 14.5* 22.5* 10.7* 7.3   Microbiology Recent Results (from the past 240 hour(s))  Culture, blood (Routine x 2)     Status: None (Preliminary result)   Collection Time: 05/26/22 12:40 AM   Specimen: BLOOD RIGHT HAND  Result Value Ref Range  Status   Specimen Description   Final    BLOOD RIGHT HAND Performed at [facility], [address] [place], [place] [address]    Special Requests   Final    BOTTLES DRAWN AEROBIC AND ANAEROBIC Blood Culture results may not be optimal due to an inadequate volume of blood received in culture bottles Performed at [facility], [address] [place], [place] [address]    Culture   Final    NO GROWTH 4 DAYS Performed at [facility], [place] [address] [place], [place] [address]    Report Status PENDING  Incomplete  Resp panel by RT-PCR (RSV, Flu A&B, Covid) Peripheral     Status: None   Collection Time: 05/26/22 12:40 AM   Specimen: Peripheral; Nasal Swab  Result Value Ref Range Status   SARS Coronavirus 2 by RT PCR NEGATIVE NEGATIVE Final    Comment: (NOTE) SARS-CoV-2 target nucleic acids are NOT DETECTED.  The SARS-CoV-2 RNA is generally detectable in upper respiratory specimens during the acute phase of infection. The lowest concentration of SARS-CoV-2 viral copies this assay can detect is 138 copies/mL. A negative result does not preclude SARS-Cov-2 infection and should not be used as the sole basis for treatment or other patient management decisions. A negative result may occur with  improper specimen collection/handling, submission of specimen other than nasopharyngeal swab, presence of viral mutation(s) within the areas targeted by this assay, and inadequate number of viral  copies(<138 copies/mL). A negative result must be combined with clinical observations, patient history, and epidemiological information. The expected result is Negative.  Fact Sheet for Patients:  EntrepreneurPulse.com.au  Fact Sheet for Healthcare Providers:  IncredibleEmployment.be  This test is no t yet approved or cleared by the [place] FDA and  has been authorized for detection and/or diagnosis of SARS-CoV-2 by FDA under an  Emergency Use Authorization (EUA). This EUA will remain  in effect (meaning this test can be used) for the duration of the COVID-19 declaration under Section 564(b)(1) of the Act, 21 U.S.C.section 360bbb-3(b)(1), unless the authorization is terminated  or revoked sooner.       Influenza A by PCR NEGATIVE NEGATIVE Final   Influenza B by PCR NEGATIVE NEGATIVE Final    Comment: (NOTE) The Xpert Xpress SARS-CoV-2/FLU/RSV plus assay is intended as an aid in the diagnosis of influenza from Nasopharyngeal swab specimens and should not be used as a sole basis for treatment. Nasal washings and aspirates are unacceptable for Xpert Xpress SARS-CoV-2/FLU/RSV testing.  Fact Sheet for Patients: EntrepreneurPulse.com.au  Fact Sheet for Healthcare Providers: IncredibleEmployment.be  This test is not yet approved or cleared by the [place] FDA and has been authorized for detection and/or diagnosis of SARS-CoV-2 by FDA under an Emergency Use Authorization (EUA). This EUA will remain in effect (meaning this test can be used) for the duration of the COVID-19 declaration under Section 564(b)(1) of the Act, 21 U.S.C. section 360bbb-3(b)(1), unless the authorization is terminated or revoked.     Resp Syncytial Virus by PCR NEGATIVE NEGATIVE Final    Comment: (NOTE) Fact Sheet for Patients: EntrepreneurPulse.com.au  Fact Sheet for Healthcare Providers: IncredibleEmployment.be  This test is not yet approved or cleared by the [place] FDA and has been authorized for detection and/or diagnosis of SARS-CoV-2 by FDA under an Emergency Use Authorization (EUA). This EUA will remain in effect (meaning this test can be used) for the duration of the COVID-19 declaration under Section 564(b)(1) of the Act, 21 U.S.C. section 360bbb-3(b)(1), unless the authorization is terminated or revoked.  Performed at [facility], [address] [place], [place] [address]   Culture, blood (Routine x 2)     Status: None (Preliminary result)   Collection Time: 05/26/22  1:05 AM   Specimen: BLOOD LEFT WRIST  Result Value Ref Range Status   Specimen Description   Final    BLOOD LEFT WRIST Performed at [facility], [address] [place], [place] [address]    Special Requests   Final    BOTTLES DRAWN AEROBIC AND ANAEROBIC Blood Culture results may not be optimal due to an inadequate volume of blood received in culture bottles Performed at [facility], [address] [place], [place] [address]    Culture   Final    NO GROWTH 4 DAYS Performed at [facility], [place] [address], [place], [place] [address]    Report Status PENDING  Incomplete  MRSA Next Gen by PCR, Nasal     Status: Abnormal   Collection Time: 05/26/22  6:28 AM   Specimen: Nasal Mucosa; Nasal Swab  Result Value Ref Range Status   MRSA by PCR Next Gen DETECTED (A) NOT DETECTED Final    Comment: RESULT CALLED TO, READ BACK BY AND VERIFIED WITH: RN Ellard Artis CHEEK [phone] AT 0858 BY EC (NOTE) The GeneXpert MRSA Assay (FDA approved for NASAL specimens only), is one component of a  comprehensive MRSA colonization surveillance program. It is not intended to diagnose MRSA infection nor to guide or monitor treatment for MRSA infections. Test performance is not FDA approved in patients less than 33 years old. Performed at [facility], [place] [address], [place], [place] [address]   Body fluid culture w Gram Stain     Status: None   Collection Time: 05/26/22  9:55 AM   Specimen: Pleural Fluid  Result Value Ref Range Status   Specimen Description PLEURAL  Final   Special Requests RIGHT  Final   Gram Stain   Final    MODERATE WBC PRESENT, PREDOMINANTLY PMN NO ORGANISMS SEEN    Culture   Final    NO GROWTH 3 DAYS Performed at [facility], [place] [address], [place], [place] [address]    Report Status  05/29/2022 FINAL  Final     Time coordinating discharge: Over 30 minutes  SIGNED:   Phillips Climes, MD  [facility] 05/31/2022, 9:55 AM Pager   If 7PM-7AM, please contact night-coverage [email or web address]

## 2022-05-31 NOTE — TOC Transition Note (Signed)
Transition of Care [facility]) - CM/SW Discharge Note   Patient Details  Name: Sergio [name] MRN: [id number] Date of Birth: [birth year]  Transition of Care [facility]) CM/SW Contact:  Levonne Lapping, RN Phone Number: 05/31/2022, 10:07 AM   Clinical Narrative:    Patient will DC to home with DAD today after O2 delivered from [place] . Home  Health PT/OT has been recommended and will be provided by [facility]. No additional TOC need identified Pateint will follow up as directed on DC AVS       Final next level of care: [place] Barriers to Discharge: Continued Medical Work up   Patient Goals and CMS Choice CMS Medicare.gov Compare Post Acute Care list provided to:: Patient Choice offered to / list presented to : Patient, Parent (Mother)  Discharge Placement                         Discharge Plan and Services Additional resources added to the After Visit Summary for     Discharge Planning Services: CM Consult            DME Arranged: Oxygen DME Agency: [employer] Date DME Agency Contacted: 05/31/22 Time DME Agency Contacted: 1006 Representative spoke with at DME Agency: Brenton Grills HH Arranged: PT, OT [place] Agency: [place] Date [place]: 05/30/22 Time [place]: 1400 Representative spoke with at [place]: [place] Determinants of Health ([place]) Interventions [place]: Food Insecurity Present (05/17/2021)  Housing: Low Risk  (05/17/2021)  Transportation Needs: No Transportation Needs (05/17/2021)  Alcohol Screen: Low Risk  (05/17/2021)  Depression (PHQ2-9): Low Risk  (05/17/2021)  Recent Concern: Depression (PHQ2-9) - Medium Risk (03/05/2021)  Financial Resource Strain: Medium Risk (05/17/2021)  Physical Activity: Inactive (05/17/2021)  Social Connections: Moderately Integrated (05/17/2021)  Stress: Stress Concern Present (05/17/2021)  Tobacco Use: Low Risk  (05/26/2022)     Readmission  Risk Interventions     No data to display

## 2022-05-31 NOTE — Discharge Instructions (Addendum)
Follow with Primary MD Janora Norlander, DO in 7 days   Get CBC, CMP, 2 view Chest X ray checked  by Primary MD next visit.    Activity: As tolerated with Full fall precautions use walker/cane & assistance as needed   Disposition Home    Diet: Regular diet   On your next visit with your primary care physician please Get Medicines reviewed and adjusted.   Please request your Prim.MD to go over all Hospital Tests and Procedure/Radiological results at the follow up, please get all Hospital records sent to your Prim MD by signing hospital release before you go home.   If you experience worsening of your admission symptoms, develop shortness of breath, life threatening emergency, suicidal or homicidal thoughts you must seek medical attention immediately by calling 911 or calling your MD immediately  if symptoms less severe.  You Must read complete instructions/literature along with all the possible adverse reactions/side effects for all the Medicines you take and that have been prescribed to you. Take any new Medicines after you have completely understood and accpet all the possible adverse reactions/side effects.   Do not drive, operating heavy machinery, perform activities at heights, swimming or participation in water activities or provide baby sitting services if your were admitted for syncope or siezures until you have seen by Primary MD or a Neurologist and advised to do so again.  Do not drive when taking Pain medications.    Do not take more than prescribed Pain, Sleep and Anxiety Medications  Special Instructions: If you have smoked or chewed Tobacco  in the last 2 yrs please stop smoking, stop any regular Alcohol  and or any Recreational drug use.  Wear Seat belts while driving.   Please note  You were cared for by a hospitalist during your hospital stay. If you have any questions about your discharge medications or the care you received while you were in the hospital  after you are discharged, you can call the unit and asked to speak with the hospitalist on call if the hospitalist that took care of you is not available. Once you are discharged, your primary care physician will handle any further medical issues. Please note that NO REFILLS for any discharge medications will be authorized once you are discharged, as it is imperative that you return to your primary care physician (or establish a relationship with a primary care physician if you do not have one) for your aftercare needs so that they can reassess your need for medications and monitor your lab values.

## 2022-05-31 NOTE — Plan of Care (Signed)
Patient discharging home today with his parents

## 2022-06-01 DIAGNOSIS — Z792 Long term (current) use of antibiotics: Secondary | ICD-10-CM | POA: Diagnosis not present

## 2022-06-01 DIAGNOSIS — J9601 Acute respiratory failure with hypoxia: Secondary | ICD-10-CM | POA: Diagnosis not present

## 2022-06-01 DIAGNOSIS — Z9981 Dependence on supplemental oxygen: Secondary | ICD-10-CM | POA: Diagnosis not present

## 2022-06-01 DIAGNOSIS — G919 Hydrocephalus, unspecified: Secondary | ICD-10-CM | POA: Diagnosis not present

## 2022-06-01 DIAGNOSIS — Z86011 Personal history of benign neoplasm of the brain: Secondary | ICD-10-CM | POA: Diagnosis not present

## 2022-06-01 DIAGNOSIS — J90 Pleural effusion, not elsewhere classified: Secondary | ICD-10-CM | POA: Diagnosis not present

## 2022-06-01 DIAGNOSIS — G4733 Obstructive sleep apnea (adult) (pediatric): Secondary | ICD-10-CM | POA: Diagnosis not present

## 2022-06-01 DIAGNOSIS — F0392 Unspecified dementia, unspecified severity, with psychotic disturbance: Secondary | ICD-10-CM | POA: Diagnosis not present

## 2022-06-01 DIAGNOSIS — Q909 Down syndrome, unspecified: Secondary | ICD-10-CM | POA: Diagnosis not present

## 2022-06-01 DIAGNOSIS — F259 Schizoaffective disorder, unspecified: Secondary | ICD-10-CM | POA: Diagnosis not present

## 2022-06-01 DIAGNOSIS — Z6841 Body Mass Index (BMI) 40.0 and over, adult: Secondary | ICD-10-CM | POA: Diagnosis not present

## 2022-06-01 DIAGNOSIS — J9811 Atelectasis: Secondary | ICD-10-CM | POA: Diagnosis not present

## 2022-06-01 DIAGNOSIS — K219 Gastro-esophageal reflux disease without esophagitis: Secondary | ICD-10-CM | POA: Diagnosis not present

## 2022-06-01 DIAGNOSIS — M25561 Pain in right knee: Secondary | ICD-10-CM | POA: Diagnosis not present

## 2022-06-01 DIAGNOSIS — R1310 Dysphagia, unspecified: Secondary | ICD-10-CM | POA: Diagnosis not present

## 2022-06-01 DIAGNOSIS — Z9181 History of falling: Secondary | ICD-10-CM | POA: Diagnosis not present

## 2022-06-01 DIAGNOSIS — Z982 Presence of cerebrospinal fluid drainage device: Secondary | ICD-10-CM | POA: Diagnosis not present

## 2022-06-03 ENCOUNTER — Telehealth: Payer: Self-pay | Admitting: Family Medicine

## 2022-06-03 NOTE — Telephone Encounter (Signed)
Patient is scheduled 06/12/2022 at 10:30am with Roxan Diesel, NP- reminder mailed. Nothing further needed.

## 2022-06-03 NOTE — Telephone Encounter (Signed)
LMOVM for Sergio [name] w/ Sergio [name]  ok for VO for nursing, [facility] referral came from hospital As for other order, pt or family need to call to make a hospital follow-up appt, pt has not been seen since Oct 2022.

## 2022-06-04 DIAGNOSIS — J9811 Atelectasis: Secondary | ICD-10-CM | POA: Diagnosis not present

## 2022-06-04 DIAGNOSIS — Q909 Down syndrome, unspecified: Secondary | ICD-10-CM | POA: Diagnosis not present

## 2022-06-04 DIAGNOSIS — J9601 Acute respiratory failure with hypoxia: Secondary | ICD-10-CM | POA: Diagnosis not present

## 2022-06-04 DIAGNOSIS — J90 Pleural effusion, not elsewhere classified: Secondary | ICD-10-CM | POA: Diagnosis not present

## 2022-06-04 DIAGNOSIS — G4733 Obstructive sleep apnea (adult) (pediatric): Secondary | ICD-10-CM | POA: Diagnosis not present

## 2022-06-04 DIAGNOSIS — G919 Hydrocephalus, unspecified: Secondary | ICD-10-CM | POA: Diagnosis not present

## 2022-06-05 DIAGNOSIS — J9811 Atelectasis: Secondary | ICD-10-CM | POA: Diagnosis not present

## 2022-06-05 DIAGNOSIS — G4733 Obstructive sleep apnea (adult) (pediatric): Secondary | ICD-10-CM | POA: Diagnosis not present

## 2022-06-05 DIAGNOSIS — J9601 Acute respiratory failure with hypoxia: Secondary | ICD-10-CM | POA: Diagnosis not present

## 2022-06-05 DIAGNOSIS — G919 Hydrocephalus, unspecified: Secondary | ICD-10-CM | POA: Diagnosis not present

## 2022-06-05 DIAGNOSIS — J90 Pleural effusion, not elsewhere classified: Secondary | ICD-10-CM | POA: Diagnosis not present

## 2022-06-05 DIAGNOSIS — Q909 Down syndrome, unspecified: Secondary | ICD-10-CM | POA: Diagnosis not present

## 2022-06-12 ENCOUNTER — Ambulatory Visit: Payer: Medicare Other

## 2022-06-12 ENCOUNTER — Other Ambulatory Visit: Payer: Self-pay | Admitting: Nurse Practitioner

## 2022-06-12 ENCOUNTER — Ambulatory Visit (INDEPENDENT_AMBULATORY_CARE_PROVIDER_SITE_OTHER): Payer: Medicare Other | Admitting: Nurse Practitioner

## 2022-06-12 ENCOUNTER — Ambulatory Visit (INDEPENDENT_AMBULATORY_CARE_PROVIDER_SITE_OTHER): Payer: Medicare Other

## 2022-06-12 ENCOUNTER — Encounter: Payer: Self-pay | Admitting: Nurse Practitioner

## 2022-06-12 VITALS — BP 130/80 | HR 66 | Ht 60.5 in | Wt 280.0 lb

## 2022-06-12 DIAGNOSIS — G4733 Obstructive sleep apnea (adult) (pediatric): Secondary | ICD-10-CM

## 2022-06-12 DIAGNOSIS — J9601 Acute respiratory failure with hypoxia: Secondary | ICD-10-CM | POA: Diagnosis not present

## 2022-06-12 DIAGNOSIS — J90 Pleural effusion, not elsewhere classified: Secondary | ICD-10-CM

## 2022-06-12 NOTE — Progress Notes (Signed)
$@[email or web address]$  ID: Sergio [name], male    DOB: May 05, 1986, 37 y.o.   MRN: [id number]  Chief Complaint  Patient presents with   Follow-up    HFU for pleural effusion, CXR was done this morning prior to visit. Mother reports that he breathing is better than before the admission  - no wheezing, fevers    Referring provider: Janora Norlander, DO  HPI: 37 year old male, never smoker followed for pneumonia and parapneumonic effusion. Past medical history significant for Down syndrome, OSA, GERD with dysphagia, hydrocephalus s/p VP shunt, pituitary adenoma, schizoaffective disorder, dementia/memory impairment.   He was recently admitted from 05/26/2022-05/31/2022 for acute respiratory failure secondary to large right pleural effusion, suspected to be parapneumonic, and suspected pneumonia. He was treated with chest tube and pleural lytic therapy x2. CT was removed on 1/24. He was also started on broad spectrum IV abx and transitioned to levaquin 14 day course upon discharge.   TEST/EVENTS:  05/26/2022 CXR: large right pleural effusion with compressive atelectasis of RUL and collapse of RML and RLL 05/29/2022 CT chest wo contrast:  trace pericardial effusion. Right-sided pigtail CT in place. Multiple side holes are extrapleural in location. Small residual loculated right pleural effusion with pleural thickening. Dense atelectasis vs consolidation in the lateral RML and RLL. Hazy ground-glass densities and scattered smooth interlobular septal thickening through much of right lung.  05/30/2022 CXR: persistent ill-defined opacities within the RML and RLL. Persistent, improved edema in right lung. Persistent small right pleural effusion or thickening.   06/12/2022: Today - follow up Patient presents today with his mother for hospital follow up. His mother provides his history. He will communicate with her and express if he is uncomfortable. He has been doing well since he was discharged home. His mother feels  like he's back to his baseline for the most part. He still has an occasional cough but it sounds better and she doesn't think he's getting anything up. His breathing has not looked labored and they haven't heard him wheezing. He has not had any recurrent fevers. Eating and drinking well. Seems to be acting like himself. He will complete the levaquin course in a few days.  He does have sleep apnea. He has trouble leaving the mask on at night per his mother. She tends to find he has removed it in the middle of the night. It is a full face mask. He does wear oxygen instead but usually ends up taking this off too. He does seem to tolerate the nasal cannula better than the full face though.   Allergies  Allergen Reactions   Cefaclor Anaphylaxis and Shortness Of Breath   Penicillins Anaphylaxis and Rash    Has patient had a PCN reaction causing immediate rash, facial/tongue/throat swelling, SOB or lightheadedness with hypotension: yes Has patient had a PCN reaction causing severe rash involving mucus membranes or skin necrosis: no Has patient had a PCN reaction that required hospitalization: no Has patient had a PCN reaction occurring within the last 10 years: no If all of the above answers are "NO", then may proceed with Cephalosporin use.    Clindamycin Rash   Clindamycin/Lincomycin Rash    Immunization History  Administered Date(s) Administered   DTaP 09/17/1988, 10/27/1990, 11/18/1991   HIB (PRP-OMP) 09/17/1988   Hepatitis B 01/24/2000, 03/03/2000, 07/21/2000   IPV 09/17/1988, 10/27/1990, 11/18/1991   Influenza Inj Mdck Quad Pf 07/04/2018   Influenza,inj,Quad PF,6+ Mos 05/11/2013, 03/24/2014, 04/08/2017, 03/05/2021   Influenza-Unspecified 03/24/2014, 02/03/2015, 04/22/2017  MMR 11/18/1991   Moderna Sars-Covid-2 Vaccination 04/25/2020   PPD Test 01/31/2014    Past Medical History:  Diagnosis Date   Dementia ([place])    Difficulty swallowing    Down's syndrome    Dysphagia causing  pulmonary aspiration with swallowing    GERD (gastroesophageal reflux disease)    Headache(784.0)    Hydrocephalus (HCC)    Insomnia    Memory loss    OSA (obstructive sleep apnea)    Pituitary adenoma (HCC)    Schizoaffective disorder    Weakness     Tobacco History: Social History   Tobacco Use  Smoking Status Never  Smokeless Tobacco Never   Counseling given: Not Answered   Outpatient Medications Prior to Visit  Medication Sig Dispense Refill   ipratropium-albuterol (DUONEB) 0.5-2.5 (3) MG/3ML SOLN NEBULIZE 1 VIAL EVERY 6 HOURS AS NEEDED FOR WHEEZING OR SHORTNESS OF BREATH (Patient taking differently: Take 3 mLs by nebulization every 6 (six) hours as needed (for shortness of breath or wheezing).) 180 mL 2   ketoconazole (NIZORAL) 2 % cream Apply topically daily as needed for irritation (rash in groin). X14 days per flare until cleared (Patient taking differently: Apply 1 Application topically daily. X14 days per flare until cleared) 120 g 3   ketoconazole (NIZORAL) 2 % shampoo APPLY TOPICALLY 2 TIMES A WEEK (Patient taking differently: Apply 1 Application topically every other day.) 120 mL 3   Melatonin 10 MG TABS Take 10 mg by mouth at bedtime.      metroNIDAZOLE (METROCREAM) 0.75 % cream APPLY TO THE AFFECTED AREA(S) TWICE DAILY FOR ROSACEA OF FACE 45 g 3   paliperidone (INVEGA) 6 MG 24 hr tablet Take 1 tablet (6 mg total) by mouth at bedtime. 30 tablet 2   traZODone (DESYREL) 100 MG tablet Take 2 tablets (200 mg total) by mouth at bedtime. (Patient taking differently: Take 100 mg by mouth at bedtime.) 60 tablet 2   Vitamin D, Ergocalciferol, (DRISDOL) 1.25 MG (50000 UNIT) CAPS capsule Take 1 capsule (50,000 Units total) by mouth every 7 (seven) days. (Patient taking differently: Take 50,000 Units by mouth daily.) 12 capsule 3   levofloxacin (LEVAQUIN) 750 MG tablet Take 1 tablet (750 mg total) by mouth daily. 9 tablet 0   No facility-administered medications prior to visit.      Review of Systems:   Constitutional: No weight loss or gain, night sweats, fevers, fatigue, or lassitude. HEENT: No sneezing, nasal congestion/drainage CV:  No chest pain, orthopnea, PND, swelling in lower extremities, anasarca, dizziness, palpitations, syncope Resp: No shortness of breath with exertion or at rest. No excess mucus or change in color of mucus. No productive or non-productive. No hemoptysis. No wheezing.  No chest wall deformity GI:  No heartburn, indigestion, abdominal pain, nausea, vomiting, diarrhea, change in bowel habits, loss of appetite, bloody stools.  GU: No dysuria, change in color of urine, urgency or frequency.  Skin: No rash, lesions, ulcerations Neuro: No dizziness or lightheadedness.  Psych: Mood stable.     Physical Exam:  BP 130/80   Pulse 66   Ht 5' 0.5" (1.537 m)   Wt 280 lb (127 kg)   SpO2 97%   BMI 53.78 kg/m   GEN: Pleasant, non-verbal during exam; obese; in no acute distress. HEENT:  Normocephalic and atraumatic. PERRLA. Sclera white. Nasal turbinates pink, moist and patent bilaterally. No rhinorrhea present. Oropharynx pink and moist, without exudate or edema. No lesions, ulcerations, or postnasal drip. Mallampati [name] NECK:  Supple w/  fair ROM. No JVD present. Normal carotid impulses w/o bruits. Thyroid symmetrical with no goiter or nodules palpated. No lymphadenopathy.   CV: RRR, no m/r/g, no peripheral edema. Pulses intact, +2 bilaterally. No cyanosis, pallor or clubbing. PULMONARY:  Unlabored, regular breathing. Clear bilaterally A&P w/o wheezes/rales/rhonchi. No accessory muscle use.  GI: BS present and normoactive. Soft, non-tender to palpation. No organomegaly or masses detected.  MSK: No erythema, warmth or tenderness. Cap refil <2 sec all extrem. No deformities or joint swelling noted.  Neuro: A/Ox3. No focal deficits noted.   Skin: Warm, no lesions or rashes    Lab Results:  CBC    Component Value Date/Time   WBC 7.3  05/30/2022 0429   RBC 4.10 (L) 05/30/2022 0429   HGB 13.2 05/30/2022 0429   HGB 16.1 03/05/2021 1549   HCT 40.5 05/30/2022 0429   HCT 48.1 03/05/2021 1549   PLT 281 05/30/2022 0429   PLT 227 03/05/2021 1549   MCV 98.8 05/30/2022 0429   MCV 89 03/05/2021 1549   MCH 32.2 05/30/2022 0429   MCHC 32.6 05/30/2022 0429   RDW 14.3 05/30/2022 0429   RDW 13.5 03/05/2021 1549   LYMPHSABS 1.3 05/26/2022 0046   LYMPHSABS 2.8 01/13/2015 1646   MONOABS 0.7 05/26/2022 0046   EOSABS 0.0 05/26/2022 0046   EOSABS 0.1 01/13/2015 1646   BASOSABS 0.1 05/26/2022 0046   BASOSABS 0.1 01/13/2015 1646    BMET    Component Value Date/Time   NA 139 05/30/2022 0429   NA 139 03/05/2021 1549   K 4.2 05/30/2022 0429   CL 98 05/30/2022 0429   CO2 33 (H) 05/30/2022 0429   GLUCOSE 99 05/30/2022 0429   BUN 11 05/30/2022 0429   BUN 15 03/05/2021 1549   CREATININE 0.91 05/30/2022 0429   CREATININE 1.14 09/08/2012 1731   CALCIUM 8.5 (L) 05/30/2022 0429   GFRNONAA >60 05/30/2022 0429   GFRNONAA 88 09/08/2012 1731   GFRAA >60 05/05/2017 1541   GFRAA >89 09/08/2012 1731    BNP    Component Value Date/Time   BNP 36.8 05/05/2017 1541   BNP 15.1 09/08/2012 1731     Imaging:  DG Chest 2 View  Result Date: 06/12/2022 CLINICAL DATA:  Pleural effusion EXAM: CHEST - 2 VIEW COMPARISON:  Chest radiograph 05/30/2022 FINDINGS: Redemonstrated catheter tubing projecting over the right upper hemithorax. Stable prominent cardiac and mediastinal contours. Interval improvement in previously visualized bilateral airspace opacities. Persistent consolidative opacities within the right mid and lower lung. Persistent small right pleural effusion. Thoracic spine degenerative changes. IMPRESSION: 1. Interval improvement in bilateral airspace opacities. 2. Persistent consolidative opacities within the right mid and lower lung. 3. Small right pleural effusion. Electronically Signed   By: Lovey Newcomer M.D.   On: 06/12/2022 16:09    DG CHEST PORT 1 VIEW  Result Date: 05/30/2022 CLINICAL DATA:  Provided history: Pleural effusion. EXAM: PORTABLE CHEST 1 VIEW COMPARISON:  Chest CT 05/29/2022. Prior chest radiographs 05/28/2022 and earlier. FINDINGS: Redemonstrated shunt fragment coursing inferiorly from the right neck and terminating at the level of the upper chest. A previously demonstrated right-sided chest tube is no longer present. The cardiomediastinal silhouette is unchanged. Persistent ill-defined opacities within the right middle and lower lobes, which may reflect atelectasis and/or pneumonia. Aeration of the right lung has improved elsewhere. However, there are persistent background vague opacities within the right lung which likely reflect re-expansion edema. No appreciable airspace consolidation on the left. Persistent small right pleural effusion and/or pleural thickening.  No evidence of pneumothorax. IMPRESSION: 1. Persistent ill-defined opacities within the right middle and lower lobes, which may reflect atelectasis and/or pneumonia. 2. Persistent, although improved, re-expansion edema elsewhere within the right lung. 3. Persistent small right pleural effusion and/or pleural thickening. 4. No evidence of pneumothorax status post right chest tube removal. Electronically Signed   By: Kellie Simmering D.O.   On: 05/30/2022 08:29   DG Knee Right Port  Result Date: 05/29/2022 CLINICAL DATA:  RIGHT knee pain EXAM: PORTABLE RIGHT KNEE - 1-2 VIEW COMPARISON:  None Available. FINDINGS: Osseous mineralization normal. Joint spaces preserved. No fracture, dislocation, or bone destruction. No joint effusion. IMPRESSION: Normal exam. Electronically Signed   By: Lavonia Dana M.D.   On: 05/29/2022 11:56   CT CHEST WO CONTRAST  Result Date: 05/29/2022 CLINICAL DATA:  Pleural effusion follow-up status post chest tube drainage. EXAM: CT CHEST WITHOUT CONTRAST TECHNIQUE: Multidetector CT imaging of the chest was performed following the standard  protocol without IV contrast. RADIATION DOSE REDUCTION: This exam was performed according to the departmental dose-optimization program which includes automated exposure control, adjustment of the mA and/or kV according to patient size and/or use of iterative reconstruction technique. COMPARISON:  Chest x-ray from yesterday. FINDINGS: Cardiovascular: The heart is at the upper limits of normal in size. Trace pericardial effusion. No thoracic aortic aneurysm. Mediastinum/Nodes: No enlarged mediastinal or axillary lymph nodes. Thyroid gland, trachea, and esophagus demonstrate no significant findings. Lungs/Pleura: Posterior approach right-sided pigtail chest tube in place. Multiple side holes are extrapleural in location. Small residual loculated right pleural effusion with pleural thickening. Dense atelectasis versus consolidation in the lateral right middle lobe medial right lower lobe. Hazy ground-glass densities and scattered smooth interlobular septal thickening throughout much of the right lung. Trace left pleural effusion with subsegmental atelectasis in the left upper and lower lobes. Upper Abdomen: No acute abnormality. Musculoskeletal: No acute or significant osseous findings. Multiple vertebral body hemangiomas. IMPRESSION: 1. Posterior approach right-sided pigtail chest tube in place. Multiple side holes are extrapleural in location. Consider replacement or removal. 2. Small residual loculated right pleural effusion with pleural thickening, suggestive of resolving empyema. 3. Dense atelectasis versus pneumonia in the lateral right middle lobe and medial right lower lobe. 4. Hazy ground-glass densities and scattered smooth interlobular septal thickening throughout much of the right lung, likely residual re-expansion pulmonary edema. 5. Trace left pleural effusion. Electronically Signed   By: Titus Dubin M.D.   On: 05/29/2022 09:02   DG CHEST PORT 1 VIEW  Result Date: 05/28/2022 CLINICAL DATA:   Pleural effusion. EXAM: PORTABLE CHEST 1 VIEW COMPARISON:  AP chest 122 2024, 05/26/2022 FINDINGS: Ventricular shunt catheter fragment again overlies the right neck and superior right hemithorax. Right basilar pleural drainage catheter appears now estimated changed in position on single frontal view. Marked interval improvement in the prior moderate to large right pleural effusion, now with minimal fluid at the right hemidiaphragm and likely within right minor fissure. No definite left pleural effusion. Moderate bilateral interstitial thickening is similar to prior. Cardiac silhouette is again moderately enlarged. Mediastinal contours are not well evaluated due to AP technique and low lung volumes. No pneumothorax is seen. No acute skeletal abnormality. IMPRESSION: 1. Marked interval improvement in the prior moderate to large right pleural effusion, now with minimal fluid overlying the right hemidiaphragm and likely within the right minor fissure. 2. Moderate bilateral interstitial thickening is similar to prior. Electronically Signed   By: Yvonne Kendall M.D.   On: 05/28/2022 08:22  VAS [place] LOWER EXTREMITY VENOUS (DVT)  Result Date: 05/27/2022  Lower Venous DVT Study Patient Name:  EIDEN BLACKARD [name]  Date of Exam:   05/27/2022 Medical Rec #: [id number]           Accession #:    [id number] Date of Birth: 11-18-1985           Patient Gender: M Patient Age:   31 years Exam Location:  [facility] Procedure:      VAS [place] LOWER EXTREMITY VENOUS (DVT) Referring Phys: Noemi Chapel --------------------------------------------------------------------------------  Indications: Pain.  Comparison Study: no prior Performing Technologist: Archie Patten RVS  Examination Guidelines: A complete evaluation includes B-mode imaging, spectral Doppler, color Doppler, and power Doppler as needed of all accessible portions of each vessel. Bilateral testing is considered an integral part of a complete examination. Limited  examinations for reoccurring indications may be performed as noted. The reflux portion of the exam is performed with the patient in reverse Trendelenburg.  +---------+---------------+---------+-----------+----------+--------------+ RIGHT    CompressibilityPhasicitySpontaneityPropertiesThrombus Aging +---------+---------------+---------+-----------+----------+--------------+ CFV      Full           Yes      Yes                                 +---------+---------------+---------+-----------+----------+--------------+ SFJ      Full                                                        +---------+---------------+---------+-----------+----------+--------------+ FV Prox  Full                                                        +---------+---------------+---------+-----------+----------+--------------+ FV Mid   Full                                                        +---------+---------------+---------+-----------+----------+--------------+ FV DistalFull                                                        +---------+---------------+---------+-----------+----------+--------------+ PFV      Full                                                        +---------+---------------+---------+-----------+----------+--------------+ POP      Full           Yes      Yes                                 +---------+---------------+---------+-----------+----------+--------------+ PTV  Full                                                        +---------+---------------+---------+-----------+----------+--------------+ PERO     Full                                                        +---------+---------------+---------+-----------+----------+--------------+   +---------+---------------+---------+-----------+----------+--------------+ LEFT     CompressibilityPhasicitySpontaneityPropertiesThrombus Aging  +---------+---------------+---------+-----------+----------+--------------+ CFV      Full           Yes      Yes                                 +---------+---------------+---------+-----------+----------+--------------+ SFJ      Full                                                        +---------+---------------+---------+-----------+----------+--------------+ FV Prox  Full                                                        +---------+---------------+---------+-----------+----------+--------------+ FV Mid   Full                                                        +---------+---------------+---------+-----------+----------+--------------+ FV DistalFull                                                        +---------+---------------+---------+-----------+----------+--------------+ PFV      Full                                                        +---------+---------------+---------+-----------+----------+--------------+ POP      Full           Yes      Yes                                 +---------+---------------+---------+-----------+----------+--------------+ PTV      Full                                                        +---------+---------------+---------+-----------+----------+--------------+  PERO     Full                                                        +---------+---------------+---------+-----------+----------+--------------+     Summary: BILATERAL: - No evidence of deep vein thrombosis seen in the lower extremities, bilaterally. -No evidence of popliteal cyst, bilaterally.   *See table(s) above for measurements and observations. Electronically signed by Servando Snare MD on 05/27/2022 at 7:49:20 PM.    Final    DG Chest Port 1 View  Result Date: 05/27/2022 CLINICAL DATA:  Right pleural effusion. EXAM: PORTABLE CHEST 1 VIEW COMPARISON:  05/26/2022. FINDINGS: 0438 hours. Slight repositioning of a right pleural drainage  catheter with unchanged moderate to large right pleural effusion. Shunt catheter tubing again projects over the right neck and upper chest. Low lung volumes accentuate the cardiomediastinal silhouette and pulmonary vasculature. Trace left pleural effusion. IMPRESSION: Unchanged moderate to large right pleural effusion with slight repositioning of the right pleural drainage catheter. Electronically Signed   By: Emmit Alexanders M.D.   On: 05/27/2022 07:35   DG CHEST PORT 1 VIEW  Result Date: 05/26/2022 CLINICAL DATA:  Pleural effusion. EXAM: PORTABLE CHEST 1 VIEW COMPARISON:  05/26/2022, earlier same day FINDINGS: 1016 hours. Interval placement of a right pleural drain with no substantial change in the large right pleural effusion. Atelectasis again noted right upper lung. Left lung clear. The cardio pericardial silhouette is enlarged. Retained shunt catheter tubing evident. IMPRESSION: Interval placement of a right pleural drain with no substantial change in the large right pleural effusion. Electronically Signed   By: Misty Stanley M.D.   On: 05/26/2022 10:25   DG Chest Port 1 View  Result Date: 05/26/2022 CLINICAL DATA:  Dyspnea, fever, wheezing EXAM: PORTABLE CHEST 1 VIEW COMPARISON:  05/22/2017 FINDINGS: Retained ventricular shunt catheter fragment noted overlying the right neck base and right apex. Large right pleural effusion has developed with compressive atelectasis of the right upper lobe and collapse of the right middle and lower lobes. No pneumothorax. Left lung is clear. No pleural effusion on the left. Cardiac size within normal limits. No acute bone abnormality. IMPRESSION: 1. Large right pleural effusion with compressive atelectasis of the right upper lobe and collapse of the right middle and lower lobes. Electronically Signed   By: Fidela Salisbury M.D.   On: 05/26/2022 01:09          No data to display          No results found for: "NITRICOXIDE"      Assessment & Plan:    Pleural effusion on right Parapneumonic effusion. He was treated with CT placement and pleural lytics x 2. He is completing 14 day course of outpatient levaquin. He is clinically improved. CXR today is also significantly improved. Question small residual effusion vs pleural thickening. We will plan to monitor his symptoms and repeat imaging in 6 weeks to ensure stability/resolution.   Patient Instructions  Continue duoneb 3 mL neb every 6 hours as needed for shortness of breath or wheezing. Notify if symptoms persist despite rescue inhaler/neb use.  Monitor for any fevers, chills, cough, shortness of breath, changes in appetite or increasing fatigue and notify if these develop.  Chest x ray today looks much better. Repeat chest x ray in 6 weeks as you just complete  antibiotics   Follow up in 6 weeks with Dr. Erin Fulling or Alanson Aly. If symptoms worsen, please contact office for sooner follow up or seek emergency care.    Acute hypoxemic respiratory failure (HCC) Resolved. Stable on room air.   OSA (obstructive sleep apnea) OSA. Intolerant of CPAP with full face mask. Takes off frequently at night. We will try him with a nasal pillow mask; although, I'm not sure this will help. His mother does understand risks of untreated OSA and does her absolute best to help put his mask back on if she notices it.    I spent 42 minutes of dedicated to the care of this patient on the date of this encounter to include pre-visit review of records, face-to-face time with the patient discussing conditions above, post visit ordering of testing, clinical documentation with the electronic health record, making appropriate referrals as documented, and communicating necessary findings to members of the patients care team.  Clayton Bibles, NP 06/14/2022  Pt aware and understands NP's role.

## 2022-06-12 NOTE — Assessment & Plan Note (Signed)
Parapneumonic effusion. He was treated with CT placement and pleural lytics x 2. He is completing 14 day course of outpatient levaquin. He is clinically improved. CXR today is also significantly improved. Question small residual effusion vs pleural thickening. We will plan to monitor his symptoms and repeat imaging in 6 weeks to ensure stability/resolution.   Patient Instructions  Continue duoneb 3 mL neb every 6 hours as needed for shortness of breath or wheezing. Notify if symptoms persist despite rescue inhaler/neb use.  Monitor for any fevers, chills, cough, shortness of breath, changes in appetite or increasing fatigue and notify if these develop.  Chest x ray today looks much better. Repeat chest x ray in 6 weeks as you just complete antibiotics   Follow up in 6 weeks with Dr. Erin Fulling or Alanson Aly. If symptoms worsen, please contact office for sooner follow up or seek emergency care.

## 2022-06-12 NOTE — Patient Instructions (Addendum)
Continue duoneb 3 mL neb every 6 hours as needed for shortness of breath or wheezing. Notify if symptoms persist despite rescue inhaler/neb use.  Monitor for any fevers, chills, cough, shortness of breath, changes in appetite or increasing fatigue and notify if these develop.  Chest x ray today looks much better. Repeat chest x ray in 6 weeks as you just complete antibiotics   Follow up in 6 weeks with Dr. Erin Fulling or Alanson Aly. If symptoms worsen, please contact office for sooner follow up or seek emergency care.

## 2022-06-12 NOTE — Assessment & Plan Note (Signed)
Resolved. Stable on room air.

## 2022-06-13 DIAGNOSIS — J90 Pleural effusion, not elsewhere classified: Secondary | ICD-10-CM | POA: Diagnosis not present

## 2022-06-13 DIAGNOSIS — Q909 Down syndrome, unspecified: Secondary | ICD-10-CM | POA: Diagnosis not present

## 2022-06-13 DIAGNOSIS — J9811 Atelectasis: Secondary | ICD-10-CM | POA: Diagnosis not present

## 2022-06-13 DIAGNOSIS — G919 Hydrocephalus, unspecified: Secondary | ICD-10-CM | POA: Diagnosis not present

## 2022-06-13 DIAGNOSIS — J9601 Acute respiratory failure with hypoxia: Secondary | ICD-10-CM | POA: Diagnosis not present

## 2022-06-13 DIAGNOSIS — G4733 Obstructive sleep apnea (adult) (pediatric): Secondary | ICD-10-CM | POA: Diagnosis not present

## 2022-06-14 ENCOUNTER — Encounter: Payer: Self-pay | Admitting: Nurse Practitioner

## 2022-06-14 DIAGNOSIS — J90 Pleural effusion, not elsewhere classified: Secondary | ICD-10-CM | POA: Diagnosis not present

## 2022-06-14 DIAGNOSIS — Q909 Down syndrome, unspecified: Secondary | ICD-10-CM | POA: Diagnosis not present

## 2022-06-14 DIAGNOSIS — G919 Hydrocephalus, unspecified: Secondary | ICD-10-CM | POA: Diagnosis not present

## 2022-06-14 DIAGNOSIS — G4733 Obstructive sleep apnea (adult) (pediatric): Secondary | ICD-10-CM | POA: Diagnosis not present

## 2022-06-14 DIAGNOSIS — J9811 Atelectasis: Secondary | ICD-10-CM | POA: Diagnosis not present

## 2022-06-14 DIAGNOSIS — J9601 Acute respiratory failure with hypoxia: Secondary | ICD-10-CM | POA: Diagnosis not present

## 2022-06-14 NOTE — Assessment & Plan Note (Signed)
OSA. Intolerant of CPAP with full face mask. Takes off frequently at night. We will try him with a nasal pillow mask; although, I'm not sure this will help. His mother does understand risks of untreated OSA and does her absolute best to help put his mask back on if she notices it.

## 2022-06-14 NOTE — Progress Notes (Signed)
CXR was improved. He still had some mild opacities in the right lung and a small effusion on the right, like we talked about at his appt. Since he just finished abx and he is overall doing better, continue with our plan as discussed previously. Plan for repeat chest x ray at his follow up. Thanks.

## 2022-06-17 ENCOUNTER — Encounter: Payer: Self-pay | Admitting: Nurse Practitioner

## 2022-06-17 ENCOUNTER — Ambulatory Visit: Payer: Medicaid Other

## 2022-06-18 DIAGNOSIS — G919 Hydrocephalus, unspecified: Secondary | ICD-10-CM | POA: Diagnosis not present

## 2022-06-18 DIAGNOSIS — J9601 Acute respiratory failure with hypoxia: Secondary | ICD-10-CM | POA: Diagnosis not present

## 2022-06-18 DIAGNOSIS — J90 Pleural effusion, not elsewhere classified: Secondary | ICD-10-CM | POA: Diagnosis not present

## 2022-06-18 DIAGNOSIS — J9811 Atelectasis: Secondary | ICD-10-CM | POA: Diagnosis not present

## 2022-06-18 DIAGNOSIS — G4733 Obstructive sleep apnea (adult) (pediatric): Secondary | ICD-10-CM | POA: Diagnosis not present

## 2022-06-18 DIAGNOSIS — Q909 Down syndrome, unspecified: Secondary | ICD-10-CM | POA: Diagnosis not present

## 2022-06-19 DIAGNOSIS — J9811 Atelectasis: Secondary | ICD-10-CM | POA: Diagnosis not present

## 2022-06-19 DIAGNOSIS — J9601 Acute respiratory failure with hypoxia: Secondary | ICD-10-CM | POA: Diagnosis not present

## 2022-06-19 DIAGNOSIS — G4733 Obstructive sleep apnea (adult) (pediatric): Secondary | ICD-10-CM | POA: Diagnosis not present

## 2022-06-19 DIAGNOSIS — G919 Hydrocephalus, unspecified: Secondary | ICD-10-CM | POA: Diagnosis not present

## 2022-06-19 DIAGNOSIS — J90 Pleural effusion, not elsewhere classified: Secondary | ICD-10-CM | POA: Diagnosis not present

## 2022-06-19 DIAGNOSIS — Q909 Down syndrome, unspecified: Secondary | ICD-10-CM | POA: Diagnosis not present

## 2022-06-21 DIAGNOSIS — J9601 Acute respiratory failure with hypoxia: Secondary | ICD-10-CM | POA: Diagnosis not present

## 2022-06-21 DIAGNOSIS — J90 Pleural effusion, not elsewhere classified: Secondary | ICD-10-CM | POA: Diagnosis not present

## 2022-06-21 DIAGNOSIS — Q909 Down syndrome, unspecified: Secondary | ICD-10-CM | POA: Diagnosis not present

## 2022-06-21 DIAGNOSIS — G919 Hydrocephalus, unspecified: Secondary | ICD-10-CM | POA: Diagnosis not present

## 2022-06-21 DIAGNOSIS — G4733 Obstructive sleep apnea (adult) (pediatric): Secondary | ICD-10-CM | POA: Diagnosis not present

## 2022-06-21 DIAGNOSIS — J9811 Atelectasis: Secondary | ICD-10-CM | POA: Diagnosis not present

## 2022-06-26 DIAGNOSIS — G919 Hydrocephalus, unspecified: Secondary | ICD-10-CM | POA: Diagnosis not present

## 2022-06-26 DIAGNOSIS — J9601 Acute respiratory failure with hypoxia: Secondary | ICD-10-CM | POA: Diagnosis not present

## 2022-06-26 DIAGNOSIS — G4733 Obstructive sleep apnea (adult) (pediatric): Secondary | ICD-10-CM | POA: Diagnosis not present

## 2022-06-26 DIAGNOSIS — J90 Pleural effusion, not elsewhere classified: Secondary | ICD-10-CM | POA: Diagnosis not present

## 2022-06-26 DIAGNOSIS — Q909 Down syndrome, unspecified: Secondary | ICD-10-CM | POA: Diagnosis not present

## 2022-06-26 DIAGNOSIS — J9811 Atelectasis: Secondary | ICD-10-CM | POA: Diagnosis not present

## 2022-07-01 DIAGNOSIS — J90 Pleural effusion, not elsewhere classified: Secondary | ICD-10-CM | POA: Diagnosis not present

## 2022-07-01 DIAGNOSIS — J9811 Atelectasis: Secondary | ICD-10-CM | POA: Diagnosis not present

## 2022-07-01 DIAGNOSIS — G4733 Obstructive sleep apnea (adult) (pediatric): Secondary | ICD-10-CM | POA: Diagnosis not present

## 2022-07-01 DIAGNOSIS — Z982 Presence of cerebrospinal fluid drainage device: Secondary | ICD-10-CM | POA: Diagnosis not present

## 2022-07-01 DIAGNOSIS — M25561 Pain in right knee: Secondary | ICD-10-CM | POA: Diagnosis not present

## 2022-07-01 DIAGNOSIS — G919 Hydrocephalus, unspecified: Secondary | ICD-10-CM | POA: Diagnosis not present

## 2022-07-01 DIAGNOSIS — Z9981 Dependence on supplemental oxygen: Secondary | ICD-10-CM | POA: Diagnosis not present

## 2022-07-01 DIAGNOSIS — Z6841 Body Mass Index (BMI) 40.0 and over, adult: Secondary | ICD-10-CM | POA: Diagnosis not present

## 2022-07-01 DIAGNOSIS — Z9181 History of falling: Secondary | ICD-10-CM | POA: Diagnosis not present

## 2022-07-01 DIAGNOSIS — K219 Gastro-esophageal reflux disease without esophagitis: Secondary | ICD-10-CM | POA: Diagnosis not present

## 2022-07-01 DIAGNOSIS — J9601 Acute respiratory failure with hypoxia: Secondary | ICD-10-CM | POA: Diagnosis not present

## 2022-07-01 DIAGNOSIS — F0392 Unspecified dementia, unspecified severity, with psychotic disturbance: Secondary | ICD-10-CM | POA: Diagnosis not present

## 2022-07-01 DIAGNOSIS — F259 Schizoaffective disorder, unspecified: Secondary | ICD-10-CM | POA: Diagnosis not present

## 2022-07-01 DIAGNOSIS — Q909 Down syndrome, unspecified: Secondary | ICD-10-CM | POA: Diagnosis not present

## 2022-07-01 DIAGNOSIS — Z86011 Personal history of benign neoplasm of the brain: Secondary | ICD-10-CM | POA: Diagnosis not present

## 2022-07-01 DIAGNOSIS — Z792 Long term (current) use of antibiotics: Secondary | ICD-10-CM | POA: Diagnosis not present

## 2022-07-01 DIAGNOSIS — R1310 Dysphagia, unspecified: Secondary | ICD-10-CM | POA: Diagnosis not present

## 2022-07-03 ENCOUNTER — Telehealth: Payer: Self-pay

## 2022-07-03 DIAGNOSIS — J9601 Acute respiratory failure with hypoxia: Secondary | ICD-10-CM | POA: Diagnosis not present

## 2022-07-03 DIAGNOSIS — J9811 Atelectasis: Secondary | ICD-10-CM | POA: Diagnosis not present

## 2022-07-03 DIAGNOSIS — J90 Pleural effusion, not elsewhere classified: Secondary | ICD-10-CM | POA: Diagnosis not present

## 2022-07-03 DIAGNOSIS — G4733 Obstructive sleep apnea (adult) (pediatric): Secondary | ICD-10-CM | POA: Diagnosis not present

## 2022-07-03 DIAGNOSIS — G919 Hydrocephalus, unspecified: Secondary | ICD-10-CM | POA: Diagnosis not present

## 2022-07-03 DIAGNOSIS — Q909 Down syndrome, unspecified: Secondary | ICD-10-CM | POA: Diagnosis not present

## 2022-07-03 NOTE — Telephone Encounter (Signed)
Left message w/ Mariann Laster in regards to the order we received from [facility]. Per Joellen Jersey we didn't order any home health services they were ordered @ d/c from hospital. Asked [facility] to please send orders to PCP.   Nothing further needed.

## 2022-07-05 ENCOUNTER — Other Ambulatory Visit: Payer: Self-pay | Admitting: Family Medicine

## 2022-07-05 DIAGNOSIS — G4733 Obstructive sleep apnea (adult) (pediatric): Secondary | ICD-10-CM | POA: Diagnosis not present

## 2022-07-05 DIAGNOSIS — J90 Pleural effusion, not elsewhere classified: Secondary | ICD-10-CM | POA: Diagnosis not present

## 2022-07-05 DIAGNOSIS — Q909 Down syndrome, unspecified: Secondary | ICD-10-CM | POA: Diagnosis not present

## 2022-07-05 DIAGNOSIS — L719 Rosacea, unspecified: Secondary | ICD-10-CM

## 2022-07-05 DIAGNOSIS — J9811 Atelectasis: Secondary | ICD-10-CM | POA: Diagnosis not present

## 2022-07-05 DIAGNOSIS — J9601 Acute respiratory failure with hypoxia: Secondary | ICD-10-CM | POA: Diagnosis not present

## 2022-07-05 DIAGNOSIS — G919 Hydrocephalus, unspecified: Secondary | ICD-10-CM | POA: Diagnosis not present

## 2022-07-05 DIAGNOSIS — L304 Erythema intertrigo: Secondary | ICD-10-CM

## 2022-07-05 MED ORDER — KETOCONAZOLE 2 % EX CREA: TOPICAL_CREAM | Freq: Every day | CUTANEOUS | 0 refills | Status: DC | PRN

## 2022-07-05 MED ORDER — METRONIDAZOLE 0.75 % EX CREA: TOPICAL_CREAM | CUTANEOUS | 0 refills | Status: DC

## 2022-07-05 MED ORDER — KETOCONAZOLE 2 % EX SHAM: MEDICATED_SHAMPOO | CUTANEOUS | 0 refills | Status: DC

## 2022-07-05 NOTE — Telephone Encounter (Signed)
Has not been seen since 2022. NOV

## 2022-07-05 NOTE — Telephone Encounter (Signed)
Patient has appointment scheduled for (November 26, 2022 CPE) and will need medication refilled for (metroNIDAZOLE (METROCREAM) 0.75 % cream and ketoconazole (NIZORAL) 2 % cream and ketoconazole (NIZORAL) 2 % shampoo) until his appointment date.  Please advise. Use [employer]

## 2022-07-10 DIAGNOSIS — J9601 Acute respiratory failure with hypoxia: Secondary | ICD-10-CM | POA: Diagnosis not present

## 2022-07-10 DIAGNOSIS — G919 Hydrocephalus, unspecified: Secondary | ICD-10-CM | POA: Diagnosis not present

## 2022-07-10 DIAGNOSIS — J90 Pleural effusion, not elsewhere classified: Secondary | ICD-10-CM | POA: Diagnosis not present

## 2022-07-10 DIAGNOSIS — G4733 Obstructive sleep apnea (adult) (pediatric): Secondary | ICD-10-CM | POA: Diagnosis not present

## 2022-07-10 DIAGNOSIS — J9811 Atelectasis: Secondary | ICD-10-CM | POA: Diagnosis not present

## 2022-07-10 DIAGNOSIS — Q909 Down syndrome, unspecified: Secondary | ICD-10-CM | POA: Diagnosis not present

## 2022-07-17 DIAGNOSIS — J9601 Acute respiratory failure with hypoxia: Secondary | ICD-10-CM | POA: Diagnosis not present

## 2022-07-17 DIAGNOSIS — J9811 Atelectasis: Secondary | ICD-10-CM | POA: Diagnosis not present

## 2022-07-17 DIAGNOSIS — G919 Hydrocephalus, unspecified: Secondary | ICD-10-CM | POA: Diagnosis not present

## 2022-07-17 DIAGNOSIS — G4733 Obstructive sleep apnea (adult) (pediatric): Secondary | ICD-10-CM | POA: Diagnosis not present

## 2022-07-17 DIAGNOSIS — Q909 Down syndrome, unspecified: Secondary | ICD-10-CM | POA: Diagnosis not present

## 2022-07-17 DIAGNOSIS — J90 Pleural effusion, not elsewhere classified: Secondary | ICD-10-CM | POA: Diagnosis not present

## 2022-07-24 ENCOUNTER — Ambulatory Visit: Payer: Medicare Other | Admitting: Pulmonary Disease

## 2022-07-24 NOTE — Progress Notes (Deleted)
Synopsis: Referred in February 2024 for hospital follow up of pleural effusion  Subjective:   PATIENT ID: Sergio [name] GENDER: male DOB: 10/21/1985, MRN: [id number]   HPI  No chief complaint on file.  Sergio [name] is a 37 year old male with downsyndrome who returns to pulmonary clinic for follow up of pleural effusion.   He was admitted 1/21 to [id number] for complicated right pleural effusion s/p pigtail chest tube placement and 1 dose of pleural lytic therapy. He was seen in follow up with Roxan Diesel, NP on 2/7 where chest x-ray showed interval improvement in bilateral air space opacities with persistent consolidative opacities in the right mid and lower lung with small right pleural effusion.He was treated with extended course of levaquin.    Past Medical History:  Diagnosis Date   Dementia ([place])    Difficulty swallowing    Down's syndrome    Dysphagia causing pulmonary aspiration with swallowing    GERD (gastroesophageal reflux disease)    Headache(784.0)    Hydrocephalus ([facility])    Insomnia    Memory loss    OSA (obstructive sleep apnea)    Pituitary adenoma ([facility])    Schizoaffective disorder    Weakness      Family History  Problem Relation Age of Onset   Hypertension Mother    Renal Disease Father    Emphysema Maternal Grandfather        Died at 25     Social History   Socioeconomic History   Marital status: Single    Spouse name: Not on file   Number of children: Not on file   Years of education: Not on file   Highest education level: Not on file  Occupational History   Occupation: disabled  Tobacco Use   Smoking status: Never   Smokeless tobacco: Never  Vaping Use   Vaping Use: Never used  Substance and Sexual Activity   Alcohol use: No   Drug use: No   Sexual activity: Never  Other Topics Concern   Not on file  Social History Narrative   ** Merged History Encounter **       Sergio [name] is a 37 year old male. He lives with both of his parents  [name] and [place]). He has 1 sister,who is 32.  He enjoys making his bed and watching movies. He has 700 movies.  Down's syndrome and schizophrenia - very dependent on his mother, [name]   Social Determinants of Health   Financial Resource Strain: Medium Risk (05/17/2021)   Overall Financial Resource Strain (CARDIA)    Difficulty of Paying Living Expenses: Somewhat hard  Food Insecurity: Food Insecurity Present (05/17/2021)   Hunger Vital Sign    Worried About Running Out of Food in the Last Year: Sometimes true    Ran Out of Food in the Last Year: Never true  Transportation Needs: No Transportation Needs (05/17/2021)   PRAPARE - Hydrologist (Medical): No    Lack of Transportation (Non-Medical): No  Physical Activity: Inactive (05/17/2021)   Exercise Vital Sign    Days of Exercise per Week: 0 days    Minutes of Exercise per Session: 0 min  Stress: Stress Concern Present (05/17/2021)   [place]    Feeling of Stress : Rather much  Social Connections: Moderately Integrated (05/17/2021)   Social Connection and Isolation Panel [NHANES]    Frequency of Communication with Friends and Family:  Never    Frequency of Social Gatherings with Friends and Family: More than three times a week    Attends Religious Services: 1 to 4 times per year    Active Member of [employer] or Organizations: Yes    Attends Archivist Meetings: 1 to 4 times per year    Marital Status: Never married  Intimate Partner Violence: Not At Risk (05/17/2021)   Humiliation, Afraid, Rape, and Kick questionnaire    Fear of Current or Ex-Partner: No    Emotionally Abused: No    Physically Abused: No    Sexually Abused: No     Allergies  Allergen Reactions   Cefaclor Anaphylaxis and Shortness Of Breath   Penicillins Anaphylaxis and Rash    Has patient had a PCN reaction causing immediate rash, facial/tongue/throat swelling, SOB  or lightheadedness with hypotension: yes Has patient had a PCN reaction causing severe rash involving mucus membranes or skin necrosis: no Has patient had a PCN reaction that required hospitalization: no Has patient had a PCN reaction occurring within the last 10 years: no If all of the above answers are "NO", then may proceed with Cephalosporin use.    Clindamycin Rash   Clindamycin/Lincomycin Rash     Outpatient Medications Prior to Visit  Medication Sig Dispense Refill   ipratropium-albuterol (DUONEB) 0.5-2.5 (3) MG/3ML SOLN NEBULIZE 1 VIAL EVERY 6 HOURS AS NEEDED FOR WHEEZING OR SHORTNESS OF BREATH (Patient taking differently: Take 3 mLs by nebulization every 6 (six) hours as needed (for shortness of breath or wheezing).) 180 mL 2   ketoconazole (NIZORAL) 2 % cream Apply topically daily as needed for irritation (rash in groin). X14 days per flare until cleared 120 g 0   ketoconazole (NIZORAL) 2 % shampoo AAA, lather, leave on for 10 minutes. Then rinse off.  Use twice weekly AS needed 120 mL 0   Melatonin 10 MG TABS Take 10 mg by mouth at bedtime.      metroNIDAZOLE (METROCREAM) 0.75 % cream APPLY TO THE AFFECTED AREA(S) TWICE DAILY FOR ROSACEA OF FACE. No more fills without appt. Has not been seen since 2022. 45 g 0   paliperidone (INVEGA) 6 MG 24 hr tablet Take 1 tablet (6 mg total) by mouth at bedtime. 30 tablet 2   traZODone (DESYREL) 100 MG tablet Take 2 tablets (200 mg total) by mouth at bedtime. (Patient taking differently: Take 100 mg by mouth at bedtime.) 60 tablet 2   Vitamin D, Ergocalciferol, (DRISDOL) 1.25 MG (50000 UNIT) CAPS capsule Take 1 capsule (50,000 Units total) by mouth every 7 (seven) days. (Patient taking differently: Take 50,000 Units by mouth daily.) 12 capsule 3   No facility-administered medications prior to visit.    ROS    Objective:  There were no vitals filed for this visit.   Physical Exam    CBC    Component Value Date/Time   WBC 7.3  05/30/2022 0429   RBC 4.10 (L) 05/30/2022 0429   HGB 13.2 05/30/2022 0429   HGB 16.1 03/05/2021 1549   HCT 40.5 05/30/2022 0429   HCT 48.1 03/05/2021 1549   PLT 281 05/30/2022 0429   PLT 227 03/05/2021 1549   MCV 98.8 05/30/2022 0429   MCV 89 03/05/2021 1549   MCH 32.2 05/30/2022 0429   MCHC 32.6 05/30/2022 0429   RDW 14.3 05/30/2022 0429   RDW 13.5 03/05/2021 1549   LYMPHSABS 1.3 05/26/2022 0046   LYMPHSABS 2.8 01/13/2015 1646   MONOABS 0.7 05/26/2022 0046  EOSABS 0.0 05/26/2022 0046   EOSABS 0.1 01/13/2015 1646   BASOSABS 0.1 05/26/2022 0046   BASOSABS 0.1 01/13/2015 1646      Latest Ref Rng & Units 05/30/2022    4:29 AM 05/28/2022    8:56 AM 05/27/2022   12:39 AM  BMP  Glucose 70 - 99 mg/dL 99  129  134   BUN 6 - 20 mg/dL 11  15  9    Creatinine 0.61 - 1.24 mg/dL 0.91  1.07  1.03   Sodium 135 - 145 mmol/L 139  134  136   Potassium 3.5 - 5.1 mmol/L 4.2  4.5  4.8   Chloride 98 - 111 mmol/L 98  96  101   CO2 22 - 32 mmol/L 33  32  30   Calcium 8.9 - 10.3 mg/dL 8.5  8.3  8.4    Chest imaging: CXR 06/12/22 Redemonstrated catheter tubing projecting over the right upper hemithorax. Stable prominent cardiac and mediastinal contours. Interval improvement in previously visualized bilateral airspace opacities. Persistent consolidative opacities within the right mid and lower lung. Persistent small right pleural effusion. Thoracic spine degenerative changes.    PFT:     No data to display          Labs:  Path:  Echo:  Heart Catheterization:       Assessment & Plan:   No diagnosis found.  Discussion: ***    Current Outpatient Medications:    ipratropium-albuterol (DUONEB) 0.5-2.5 (3) MG/3ML SOLN, NEBULIZE 1 VIAL EVERY 6 HOURS AS NEEDED FOR WHEEZING OR SHORTNESS OF BREATH (Patient taking differently: Take 3 mLs by nebulization every 6 (six) hours as needed (for shortness of breath or wheezing).), Disp: 180 mL, Rfl: 2   ketoconazole (NIZORAL) 2 % cream,  Apply topically daily as needed for irritation (rash in groin). X14 days per flare until cleared, Disp: 120 g, Rfl: 0   ketoconazole (NIZORAL) 2 % shampoo, AAA, lather, leave on for 10 minutes. Then rinse off.  Use twice weekly AS needed, Disp: 120 mL, Rfl: 0   Melatonin 10 MG TABS, Take 10 mg by mouth at bedtime. , Disp: , Rfl:    metroNIDAZOLE (METROCREAM) 0.75 % cream, APPLY TO THE AFFECTED AREA(S) TWICE DAILY FOR ROSACEA OF FACE. No more fills without appt. Has not been seen since 2022., Disp: 45 g, Rfl: 0   paliperidone (INVEGA) 6 MG 24 hr tablet, Take 1 tablet (6 mg total) by mouth at bedtime., Disp: 30 tablet, Rfl: 2   traZODone (DESYREL) 100 MG tablet, Take 2 tablets (200 mg total) by mouth at bedtime. (Patient taking differently: Take 100 mg by mouth at bedtime.), Disp: 60 tablet, Rfl: 2   Vitamin D, Ergocalciferol, (DRISDOL) 1.25 MG (50000 UNIT) CAPS capsule, Take 1 capsule (50,000 Units total) by mouth every 7 (seven) days. (Patient taking differently: Take 50,000 Units by mouth daily.), Disp: 12 capsule, Rfl: 3

## 2022-07-25 ENCOUNTER — Telehealth (HOSPITAL_COMMUNITY): Payer: Self-pay | Admitting: *Deleted

## 2022-07-25 DIAGNOSIS — F209 Schizophrenia, unspecified: Secondary | ICD-10-CM

## 2022-07-25 MED ORDER — PALIPERIDONE ER 6 MG PO TB24: 6.0000 mg | ORAL_TABLET | Freq: Every day | ORAL | 2 refills | Status: DC

## 2022-07-25 NOTE — Telephone Encounter (Signed)
Received message that patient's pharmacy called requesting refill of patient's Invega.  This was sent.  Will have staff call to schedule follow up appointment as he does not currently have one.    Sent: -Invega 6 mg QHS.  30 tablets with 2 refills.    Fatima Sanger MD Resident

## 2022-07-25 NOTE — Telephone Encounter (Signed)
Pt pharmacy, [facility], called with request to refill Invega 6 mg QHS. Pt last appointment 03/26/22 with no future appointments on the books. Please review.

## 2022-08-05 ENCOUNTER — Encounter: Payer: Self-pay | Admitting: Pulmonary Disease

## 2022-08-05 ENCOUNTER — Ambulatory Visit (INDEPENDENT_AMBULATORY_CARE_PROVIDER_SITE_OTHER): Payer: Medicare Other | Admitting: Pulmonary Disease

## 2022-08-05 ENCOUNTER — Ambulatory Visit (INDEPENDENT_AMBULATORY_CARE_PROVIDER_SITE_OTHER): Payer: Medicare Other

## 2022-08-05 VITALS — BP 120/72 | HR 61 | Ht 60.5 in | Wt 280.0 lb

## 2022-08-05 DIAGNOSIS — R051 Acute cough: Secondary | ICD-10-CM | POA: Diagnosis not present

## 2022-08-05 DIAGNOSIS — J90 Pleural effusion, not elsewhere classified: Secondary | ICD-10-CM | POA: Diagnosis not present

## 2022-08-05 DIAGNOSIS — J988 Other specified respiratory disorders: Secondary | ICD-10-CM

## 2022-08-05 NOTE — Progress Notes (Signed)
Synopsis: Referred in February 2024 for pleural effusion  Subjective:   PATIENT ID: Sergio [name] GENDER: male DOB: June 12, 1985, MRN: [id number]   HPI  Chief Complaint  Patient presents with   Follow-up    F/U for pleural effusion    Sergio [name] is a 37 year old male with down syndrome, never smoker who returns to pulmonary clinic for pleural effusion.   He was admitted 1/21 to 1/26 for respiratory failure due to pneumonia and right pleural effusion s/p chest tube with pleural lytic therapy. He was seen in follow up 2/7 by Roxan Diesel, NP and was doing well at that time with improved chest x-ray but remaining RLL and RML infiltrates and small right effusion.  He had been doing fine since that visit, but patient's mother reports he developed a cough 3-4 days ago. He has been afebrile with no other symptoms. He doesn't typically have spring time allergies. His appetite is normal.    Past Medical History:  Diagnosis Date   Dementia    Difficulty swallowing    Down's syndrome    Dysphagia causing pulmonary aspiration with swallowing    GERD (gastroesophageal reflux disease)    Headache(784.0)    Hydrocephalus    Insomnia    Memory loss    OSA (obstructive sleep apnea)    Pituitary adenoma    Schizoaffective disorder    Weakness      Family History  Problem Relation Age of Onset   Hypertension Mother    Renal Disease Father    Emphysema Maternal Grandfather        Died at 3     Social History   Socioeconomic History   Marital status: Single    Spouse name: Not on file   Number of children: Not on file   Years of education: Not on file   Highest education level: Not on file  Occupational History   Occupation: disabled  Tobacco Use   Smoking status: Never   Smokeless tobacco: Never  Vaping Use   Vaping Use: Never used  Substance and Sexual Activity   Alcohol use: No   Drug use: No   Sexual activity: Never  Other Topics Concern   Not on file   Social History Narrative   ** Merged History Encounter **       Sergio [name] is a 37 year old male. He lives with both of his parents [name] and [place]). He has 1 sister,who is 32.  He enjoys making his bed and watching movies. He has 700 movies.  Down's syndrome and schizophrenia - very dependent on his mother, [name]   Social Determinants of Health   Financial Resource Strain: Medium Risk (05/17/2021)   Overall Financial Resource Strain (CARDIA)    Difficulty of Paying Living Expenses: Somewhat hard  Food Insecurity: Food Insecurity Present (05/17/2021)   Hunger Vital Sign    Worried About Running Out of Food in the Last Year: Sometimes true    Ran Out of Food in the Last Year: Never true  Transportation Needs: No Transportation Needs (05/17/2021)   PRAPARE - Hydrologist (Medical): No    Lack of Transportation (Non-Medical): No  Physical Activity: Inactive (05/17/2021)   Exercise Vital Sign    Days of Exercise per Week: 0 days    Minutes of Exercise per Session: 0 min  Stress: Stress Concern Present (05/17/2021)   [place]  Feeling of Stress : Rather much  Social Connections: Moderately Integrated (05/17/2021)   Social Connection and Isolation Panel [NHANES]    Frequency of Communication with Friends and Family: Never    Frequency of Social Gatherings with Friends and Family: More than three times a week    Attends Religious Services: 1 to 4 times per year    Active Member of [employer] or Organizations: Yes    Attends Archivist Meetings: 1 to 4 times per year    Marital Status: Never married  Intimate Partner Violence: Not At Risk (05/17/2021)   Humiliation, Afraid, Rape, and Kick questionnaire    Fear of Current or Ex-Partner: No    Emotionally Abused: No    Physically Abused: No    Sexually Abused: No     Allergies  Allergen Reactions   Cefaclor Anaphylaxis and Shortness Of  Breath   Penicillins Anaphylaxis and Rash    Has patient had a PCN reaction causing immediate rash, facial/tongue/throat swelling, SOB or lightheadedness with hypotension: yes Has patient had a PCN reaction causing severe rash involving mucus membranes or skin necrosis: no Has patient had a PCN reaction that required hospitalization: no Has patient had a PCN reaction occurring within the last 10 years: no If all of the above answers are "NO", then may proceed with Cephalosporin use.    Clindamycin Rash   Clindamycin/Lincomycin Rash     Outpatient Medications Prior to Visit  Medication Sig Dispense Refill   ipratropium-albuterol (DUONEB) 0.5-2.5 (3) MG/3ML SOLN NEBULIZE 1 VIAL EVERY 6 HOURS AS NEEDED FOR WHEEZING OR SHORTNESS OF BREATH (Patient taking differently: Take 3 mLs by nebulization every 6 (six) hours as needed (for shortness of breath or wheezing).) 180 mL 2   ketoconazole (NIZORAL) 2 % cream Apply topically daily as needed for irritation (rash in groin). X14 days per flare until cleared 120 g 0   ketoconazole (NIZORAL) 2 % shampoo AAA, lather, leave on for 10 minutes. Then rinse off.  Use twice weekly AS needed 120 mL 0   Melatonin 10 MG TABS Take 10 mg by mouth at bedtime.      metroNIDAZOLE (METROCREAM) 0.75 % cream APPLY TO THE AFFECTED AREA(S) TWICE DAILY FOR ROSACEA OF FACE. No more fills without appt. Has not been seen since 2022. 45 g 0   paliperidone (INVEGA) 6 MG 24 hr tablet Take 1 tablet (6 mg total) by mouth at bedtime. 30 tablet 2   traZODone (DESYREL) 100 MG tablet Take 2 tablets (200 mg total) by mouth at bedtime. (Patient taking differently: Take 100 mg by mouth at bedtime.) 60 tablet 2   Vitamin D, Ergocalciferol, (DRISDOL) 1.25 MG (50000 UNIT) CAPS capsule Take 1 capsule (50,000 Units total) by mouth every 7 (seven) days. (Patient taking differently: Take 50,000 Units by mouth daily.) 12 capsule 3   No facility-administered medications prior to visit.    Review  of Systems  Constitutional:  Negative for chills, fever, malaise/fatigue and weight loss.  HENT:  Negative for congestion, sinus pain and sore throat.   Eyes: Negative.   Respiratory:  Positive for cough. Negative for hemoptysis, sputum production, shortness of breath and wheezing.   Cardiovascular:  Negative for chest pain, palpitations, orthopnea, claudication and leg swelling.  Gastrointestinal:  Negative for abdominal pain, heartburn, nausea and vomiting.  Genitourinary: Negative.   Musculoskeletal:  Negative for joint pain and myalgias.  Skin:  Negative for rash.  Neurological:  Negative for weakness.  Endo/Heme/Allergies: Negative.   Psychiatric/Behavioral:  Negative.       Objective:   Vitals:   08/05/22 1124  BP: 120/72  Pulse: 61  SpO2: 98%  Weight: 280 lb (127 kg)  Height: 5' 0.5" (1.537 m)     Physical Exam Constitutional:      General: He is not in acute distress.    Appearance: He is obese.  HENT:     Head: Normocephalic and atraumatic.  Eyes:     Extraocular Movements: Extraocular movements intact.     Conjunctiva/sclera: Conjunctivae normal.     Pupils: Pupils are equal, round, and reactive to light.  Cardiovascular:     Rate and Rhythm: Normal rate and regular rhythm.     Pulses: Normal pulses.     Heart sounds: Normal heart sounds. No murmur heard. Pulmonary:     Breath sounds: Examination of the right-lower field reveals decreased breath sounds. Examination of the left-lower field reveals decreased breath sounds. Decreased breath sounds present.  Abdominal:     General: Bowel sounds are normal.     Palpations: Abdomen is soft.  Musculoskeletal:     Right lower leg: No edema.     Left lower leg: No edema.  Lymphadenopathy:     Cervical: No cervical adenopathy.  Skin:    General: Skin is warm and dry.  Neurological:     General: No focal deficit present.     Mental Status: He is alert.  Psychiatric:        Mood and Affect: Mood normal.         Behavior: Behavior normal.        Thought Content: Thought content normal.        Judgment: Judgment normal.    CBC    Component Value Date/Time   WBC 7.3 05/30/2022 0429   RBC 4.10 (L) 05/30/2022 0429   HGB 13.2 05/30/2022 0429   HGB 16.1 03/05/2021 1549   HCT 40.5 05/30/2022 0429   HCT 48.1 03/05/2021 1549   PLT 281 05/30/2022 0429   PLT 227 03/05/2021 1549   MCV 98.8 05/30/2022 0429   MCV 89 03/05/2021 1549   MCH 32.2 05/30/2022 0429   MCHC 32.6 05/30/2022 0429   RDW 14.3 05/30/2022 0429   RDW 13.5 03/05/2021 1549   LYMPHSABS 1.3 05/26/2022 0046   LYMPHSABS 2.8 01/13/2015 1646   MONOABS 0.7 05/26/2022 0046   EOSABS 0.0 05/26/2022 0046   EOSABS 0.1 01/13/2015 1646   BASOSABS 0.1 05/26/2022 0046   BASOSABS 0.1 01/13/2015 1646      Latest Ref Rng & Units 05/30/2022    4:29 AM 05/28/2022    8:56 AM 05/27/2022   12:39 AM  BMP  Glucose 70 - 99 mg/dL 99  129  134   BUN 6 - 20 mg/dL 11  15  9    Creatinine 0.61 - 1.24 mg/dL 0.91  1.07  1.03   Sodium 135 - 145 mmol/L 139  134  136   Potassium 3.5 - 5.1 mmol/L 4.2  4.5  4.8   Chloride 98 - 111 mmol/L 98  96  101   CO2 22 - 32 mmol/L 33  32  30   Calcium 8.9 - 10.3 mg/dL 8.5  8.3  8.4    Chest imaging: CXR 06/12/22 1. Interval improvement in bilateral airspace opacities. 2. Persistent consolidative opacities within the right mid and lower lung. 3. Small right pleural effusion.  PFT:     No data to display  Labs:  Path:  Echo:  Heart Catheterization:  Assessment & Plan:   Pleural effusion on right - Plan: DG Chest 2 View  Acute cough  Discussion: Sergio [name] is a 37 year old male with down syndrome, never smoker who returns to pulmonary clinic for pleural effusion.   He has recovered well from treatment of his complicated parapneumonic effusion requiring chest tube and pleural lytic therapy.  He has new cough over recent days, will check a chest x-ray.   Follow up in 3 months.    Freda Jackson, MD [facility] Office: [phone]   See Amion for personal pager PCCM on call pager [phone] until 7pm. Please call Elink 7p-7a. [phone]     Current Outpatient Medications:    ipratropium-albuterol (DUONEB) 0.5-2.5 (3) MG/3ML SOLN, NEBULIZE 1 VIAL EVERY 6 HOURS AS NEEDED FOR WHEEZING OR SHORTNESS OF BREATH (Patient taking differently: Take 3 mLs by nebulization every 6 (six) hours as needed (for shortness of breath or wheezing).), Disp: 180 mL, Rfl: 2   ketoconazole (NIZORAL) 2 % cream, Apply topically daily as needed for irritation (rash in groin). X14 days per flare until cleared, Disp: 120 g, Rfl: 0   ketoconazole (NIZORAL) 2 % shampoo, AAA, lather, leave on for 10 minutes. Then rinse off.  Use twice weekly AS needed, Disp: 120 mL, Rfl: 0   Melatonin 10 MG TABS, Take 10 mg by mouth at bedtime. , Disp: , Rfl:    metroNIDAZOLE (METROCREAM) 0.75 % cream, APPLY TO THE AFFECTED AREA(S) TWICE DAILY FOR ROSACEA OF FACE. No more fills without appt. Has not been seen since 2022., Disp: 45 g, Rfl: 0   paliperidone (INVEGA) 6 MG 24 hr tablet, Take 1 tablet (6 mg total) by mouth at bedtime., Disp: 30 tablet, Rfl: 2   traZODone (DESYREL) 100 MG tablet, Take 2 tablets (200 mg total) by mouth at bedtime. (Patient taking differently: Take 100 mg by mouth at bedtime.), Disp: 60 tablet, Rfl: 2   Vitamin D, Ergocalciferol, (DRISDOL) 1.25 MG (50000 UNIT) CAPS capsule, Take 1 capsule (50,000 Units total) by mouth every 7 (seven) days. (Patient taking differently: Take 50,000 Units by mouth daily.), Disp: 12 capsule, Rfl: 3

## 2022-08-05 NOTE — Patient Instructions (Signed)
We will check a chest x-ray today  Follow up in 3 months.

## 2022-08-06 MED ORDER — AZITHROMYCIN 250 MG PO TABS: ORAL_TABLET | ORAL | 0 refills | Status: DC

## 2022-08-06 NOTE — Telephone Encounter (Signed)
I spoke with patient's mother regarding the chest x-ray results.  He has increased interstitial prominence.   We will try Zpak therapy first. If his cough does not resolve, then we will check CMP, CBC and BNP. If BNP is elevated we will check an Echo. If labs are not concerning, we will then check a CT chest scan.  Freda Jackson, MD [facility] Office: [phone]   See Amion for personal pager PCCM on call pager [phone] until 7pm. Please call Elink 7p-7a. [phone]

## 2022-08-14 ENCOUNTER — Ambulatory Visit (HOSPITAL_COMMUNITY): Payer: Medicare Other | Admitting: Student in an Organized Health Care Education/Training Program

## 2022-08-14 NOTE — Progress Notes (Deleted)
BH MD/PA/NP OP Progress Note  08/14/2022 7:11 AM Sergio Sergio [name] Sergio [name]  MRN:  [id number]  Chief Complaint: No chief complaint on file.  HPI:  Sergio Sergio [name] is a 37 yr old male who presents via telephone for follow up care and medication management.  PPHx is significant for Paranoid Schizophrenia, Down's Syndrome with Severe Impairment, and has Legal Guardians (parents Sergio Sergio [name] and Sergio Sergio [name]), no Suicide Attempts, 1 episode of attempting to cut Shunt out of head due to command hallucinations, and no hospitalizations.    ***   Visit Diagnosis: No diagnosis found.  Past Psychiatric History: Paranoid Schizophrenia, Down's Syndrome with Severe Impairment, and has Legal Guardians (parents Sergio Sergio [name] and Sergio Sergio [name]), no Suicide Attempts, 1 episode of attempting to cut Shunt out of head due to command hallucinations, and no hospitalizations.    Past Medical History:  Past Medical History:  Diagnosis Date   Dementia    Difficulty swallowing    Down's syndrome    Dysphagia causing pulmonary aspiration with swallowing    GERD (gastroesophageal reflux disease)    Headache(784.0)    Hydrocephalus    Insomnia    Memory loss    OSA (obstructive sleep apnea)    Pituitary adenoma    Schizoaffective disorder    Weakness     Past Surgical History:  Procedure Laterality Date   BRAIN SURGERY     CSF SHUNT     TYMPANOSTOMY TUBE PLACEMENT Bilateral    VENTRICULOPERITONEAL SHUNT      Family Psychiatric History: No Known Diagnosis', Substance Use, or Suicides.   Family History:  Family History  Problem Relation Age of Onset   Hypertension Mother    Renal Disease Father    Emphysema Maternal Grandfather        Died at 52    Social History:  Social History   Socioeconomic History   Marital status: Single    Spouse name: Not on file   Number of children: Not on file   Years of education: Not on file   Highest education level: Not on file  Occupational History   Occupation:  disabled  Tobacco Use   Smoking status: Never   Smokeless tobacco: Never  Vaping Use   Vaping Use: Never used  Substance and Sexual Activity   Alcohol use: No   Drug use: No   Sexual activity: Never  Other Topics Concern   Not on file  Social History Narrative   ** Merged History Encounter **       Sergio Sergio [name] is a 37 year old male. He lives with both of his parents Sergio Sergio [name] and Sergio Sergio [name]). He has 1 sister,who is 32.  He enjoys making his bed and watching movies. He has 700 movies.  Down's syndrome and schizophrenia - very dependent on his mother, Sergio Sergio [name]   Social Determinants of Health   Financial Resource Strain: Medium Risk (05/17/2021)   Overall Financial Resource Strain (CARDIA)    Difficulty of Paying Living Expenses: Somewhat hard  Food Insecurity: Food Insecurity Present (05/17/2021)   Hunger Vital Sign    Worried About Running Out of Food in the Last Year: Sometimes true    Ran Out of Food in the Last Year: Never true  Transportation Needs: No Transportation Needs (05/17/2021)   PRAPARE - Administrator, Civil Service (Medical): No    Lack of Transportation (Non-Medical): No  Physical Activity: Inactive (05/17/2021)   Exercise Vital Sign    Days of Exercise per Week: 0  days    Minutes of Exercise per Session: 0 min  Stress: Stress Concern Present (05/17/2021)   [name] - Occupational Stress Questionnaire    Feeling of Stress : Rather much  Social Connections: Moderately Integrated (05/17/2021)   Social Connection and Isolation Panel [NHANES]    Frequency of Communication with Friends and Family: Never    Frequency of Social Gatherings with Friends and Family: More than three times a week    Attends Religious Services: 1 to 4 times per year    Active Member of [employer] or Organizations: Yes    Attends Banker Meetings: 1 to 4 times per year    Marital Status: Never married    Allergies:  Allergies  Allergen Reactions    Cefaclor Anaphylaxis and Shortness Of Breath   Penicillins Anaphylaxis and Rash    Has patient had a PCN reaction causing immediate rash, facial/tongue/throat swelling, SOB or lightheadedness with hypotension: yes Has patient had a PCN reaction causing severe rash involving mucus membranes or skin necrosis: no Has patient had a PCN reaction that required hospitalization: no Has patient had a PCN reaction occurring within the last 10 years: no If all of the above answers are "NO", then may proceed with Cephalosporin use.    Clindamycin Rash   Clindamycin/Lincomycin Rash    Metabolic Disorder Labs: Lab Results  Component Value Date   HGBA1C 5.5 05/27/2022   MPG 111.15 05/27/2022   Lab Results  Component Value Date   PROLACTIN 51.5 (H) 01/21/2013   No results found for: "CHOL", "TRIG", "HDL", "CHOLHDL", "VLDL", "LDLCALC" Lab Results  Component Value Date   TSH 3.020 01/21/2013    Therapeutic Level Labs: No results found for: "LITHIUM" No results found for: "VALPROATE" No results found for: "CBMZ"  Current Medications: Current Outpatient Medications  Medication Sig Dispense Refill   azithromycin (ZITHROMAX) 250 MG tablet Take as directed 6 tablet 0   ipratropium-albuterol (DUONEB) 0.5-2.5 (3) MG/3ML SOLN NEBULIZE 1 VIAL EVERY 6 HOURS AS NEEDED FOR WHEEZING OR SHORTNESS OF BREATH (Patient taking differently: Take 3 mLs by nebulization every 6 (six) hours as needed (for shortness of breath or wheezing).) 180 mL 2   ketoconazole (NIZORAL) 2 % cream Apply topically daily as needed for irritation (rash in groin). X14 days per flare until cleared 120 g 0   ketoconazole (NIZORAL) 2 % shampoo AAA, lather, leave on for 10 minutes. Then rinse off.  Use twice weekly AS needed 120 mL 0   Melatonin 10 MG TABS Take 10 mg by mouth at bedtime.      metroNIDAZOLE (METROCREAM) 0.75 % cream APPLY TO THE AFFECTED AREA(S) TWICE DAILY FOR ROSACEA OF FACE. No more fills without appt. Has not been seen  since 2022. 45 g 0   paliperidone (INVEGA) 6 MG 24 hr tablet Take 1 tablet (6 mg total) by mouth at bedtime. 30 tablet 2   traZODone (DESYREL) 100 MG tablet Take 2 tablets (200 mg total) by mouth at bedtime. (Patient taking differently: Take 100 mg by mouth at bedtime.) 60 tablet 2   Vitamin D, Ergocalciferol, (DRISDOL) 1.25 MG (50000 UNIT) CAPS capsule Take 1 capsule (50,000 Units total) by mouth every 7 (seven) days. (Patient taking differently: Take 50,000 Units by mouth daily.) 12 capsule 3   No current facility-administered medications for this visit.     Musculoskeletal: Strength & Muscle Tone: {desc; muscle tone:32375} Gait & Station: {PE GAIT ED OZHY:86578} Patient leans: {Patient Leans:21022755}  Psychiatric  Specialty Exam: Review of Systems  There were no vitals taken for this visit.There is no height or weight on file to calculate BMI.  General Appearance: {Appearance:22683}  Eye Contact:  {BHH EYE CONTACT:22684}  Speech:  {Speech:22685}  Volume:  {Volume (PAA):22686}  Mood:  {BHH MOOD:22306}  Affect:  {Affect (PAA):22687}  Thought Process:  {Thought Process (PAA):22688}  Orientation:  {BHH ORIENTATION (PAA):22689}  Thought Content: {Thought Content:22690}   Suicidal Thoughts:  {ST/HT (PAA):22692}  Homicidal Thoughts:  {ST/HT (PAA):22692}  Memory:  {BHH MEMORY:22881}  Judgement:  {Judgement (PAA):22694}  Insight:  {Insight (PAA):22695}  Psychomotor Activity:  {Psychomotor (PAA):22696}  Concentration:  {Concentration:21399}  Recall:  {BHH GOOD/FAIR/POOR:22877}  Fund of Knowledge: {BHH GOOD/FAIR/POOR:22877}  Language: {BHH GOOD/FAIR/POOR:22877}  Akathisia:  {BHH YES OR NO:22294}  Handed:  Right  AIMS (if indicated): {Desc; done/not:10129}  Assets:  {Assets (PAA):22698}  ADL's:  {BHH WUJ'W:11914}ADL'S:22290}  Cognition: {chl bhh cognition:304700322}  Sleep:  {BHH GOOD/FAIR/POOR:22877}   Screenings: GAD-7    Flowsheet Row [facility]  Total GAD-7 Score 0      PHQ2-9    Flowsheet Row Clinical Support from [facility]/17/2021 in [facility] Office Visit from [facility]21 [facility] Office Visit from [facility]/2017 [facility] from [facility]2017 [facility] Office Visit from 06/30/2017 in [facility]  PHQ-2 Total Score 0 0 0 0 0  PHQ-9 Total Score -- 5 -- -- --      Flowsheet Row ED to Hosp-Admission (Discharged) from [facility]/2022 in [facility]  C-SSRS RISK CATEGORY No Risk        Assessment and Plan:  Sergio Sergio [name] is a 37 yr old male who presents via telephone for follow up care and medication management.  PPHx is significant for Paranoid Schizophrenia, Down's Syndrome with Severe Impairment, and has Legal Guardians (parents Sergio AshCarl and ONEOKerry Sergio [name]), no Suicide Attempts, 1 episode of attempting to cut Shunt out of head due to command hallucinations, and no hospitalizations.     ***   Paranoid Schizophrenia: -Continue Invega 6 mg QHS for psychosis.  30 tablets with 2 refills. -Continue Trazodone 200 mg QHS for insomnia. 60 (100 mg) tablets with 2 refills sent. -Continue Hydroxyzine 50 mg PRN TID for anxiety.  90 tablets with 1 refill.    Collaboration of Care: Collaboration of Care: {BH OP Collaboration of Care:21014065}  Patient/Guardian was advised Release of Information must be obtained prior to any record release in order to collaborate their care with an outside provider. Patient/Guardian was advised if they have not already done so to contact the registration department to sign all necessary forms in order for us to release information regarding their care.   Consent: Patient/Guardian gives verbal consent for treatment and assignment of benefits for services provided  during this visit. Patient/Guardian expressed understanding and agreed to proceed.    Lauro FranklinAlexander S Wileen Duncanson, MD 08/14/2022, 7:11 AM

## 2022-08-27 ENCOUNTER — Other Ambulatory Visit: Payer: Self-pay | Admitting: Family Medicine

## 2022-08-27 DIAGNOSIS — L719 Rosacea, unspecified: Secondary | ICD-10-CM

## 2022-08-27 DIAGNOSIS — L304 Erythema intertrigo: Secondary | ICD-10-CM

## 2022-08-28 ENCOUNTER — Telehealth (HOSPITAL_BASED_OUTPATIENT_CLINIC_OR_DEPARTMENT_OTHER): Payer: Medicare Other | Admitting: Student in an Organized Health Care Education/Training Program

## 2022-08-28 ENCOUNTER — Encounter (HOSPITAL_COMMUNITY): Payer: Self-pay | Admitting: Student in an Organized Health Care Education/Training Program

## 2022-08-28 DIAGNOSIS — F209 Schizophrenia, unspecified: Secondary | ICD-10-CM | POA: Diagnosis not present

## 2022-08-28 DIAGNOSIS — Q909 Down syndrome, unspecified: Secondary | ICD-10-CM | POA: Diagnosis not present

## 2022-08-28 DIAGNOSIS — F5101 Primary insomnia: Secondary | ICD-10-CM | POA: Diagnosis not present

## 2022-08-28 MED ORDER — HYDROXYZINE HCL 50 MG PO TABS: 50.0000 mg | ORAL_TABLET | Freq: Three times a day (TID) | ORAL | 1 refills | Status: DC | PRN

## 2022-08-28 MED ORDER — TRAZODONE HCL 100 MG PO TABS: 200.0000 mg | ORAL_TABLET | Freq: Every day | ORAL | 2 refills | Status: DC

## 2022-08-28 MED ORDER — PALIPERIDONE ER 6 MG PO TB24: 6.0000 mg | ORAL_TABLET | Freq: Every day | ORAL | 2 refills | Status: DC

## 2022-08-28 NOTE — Progress Notes (Signed)
BH MD/PA/NP OP Progress Note  08/28/2022 9:01 AM [name]  MRN:  [id number]  Chief Complaint:  Chief Complaint  Patient presents with   Follow-up   Medication Refill   HPI:  Sergio [name] is a 37 yr old male who presents via Virtual Video Visit to Establish Care and for Medication Management.  PPHx is significant for Paranoid Schizophrenia, Down's Syndrome with Severe Impairment, and has Legal Guardians (parents Council and Sergio [name]), no Suicide Attempts, 1 episode of attempting to cut Shunt out of head due to command hallucinations, and no hospitalizations.    Interview was mostly conducted with the patient's legal guardian/mother Sergio [name] as "Sergio [name]" is minimally verbal.  She reports that in January tremor became very sick and ended up being hospitalized.  She reports that Sergio [name] had issues with pneumonia and after a swallow study they had been doing better for a while but it seems that he use having more issues now and whenever he is eating or drinking there is a risk for aspiration.  She reports that the plan going forward will be for her trip her to have chest x-rays every 3 months or so to monitor for fluid buildup in the lungs so that if intervention is needed it will be done sooner.  She reports that throughout all of this stripper had no issues from a psychiatric standpoint he reports that he even had a good time while in the hospital.  She reports that after leaving the hospital he has continued to remain stable.  She reports he is having no side effects from his psychiatric medications.  She reports he continued to have his chronic AVH but that it does not bother him much.  She reports he has stopped doing the repetitive rituals with eating that he had been doing.  She reports no issues or concerns at all.  She reports he voiced no SI or HI.  At this point she took the camera into her purse room as he had just woken up.  He reported he was doing well.  He reported no  issues or concerns.  He reported he had no questions.  Discussed that we would not make any changes to his medications and they were both agreeable with this.  He reports his sleep is good.  She reports his appetite is doing good.  There are return follow-up in approximately 3 months.   Visit Diagnosis:    ICD-10-CM   1. Schizophrenia, unspecified type  F20.9 paliperidone (INVEGA) 6 MG 24 hr tablet    hydrOXYzine (ATARAX) 50 MG tablet    2. Primary insomnia  F51.01 traZODone (DESYREL) 100 MG tablet    3. Down's syndrome  Q90.9       Past Psychiatric History: Paranoid Schizophrenia, Down's Syndrome with Severe Impairment, and has Legal Guardians (parents Sergio [name] and Sergio [name]), no Suicide Attempts, 1 episode of attempting to cut Shunt out of head due to command hallucinations, and no hospitalizations.    Past Medical History:  Past Medical History:  Diagnosis Date   Dementia    Difficulty swallowing    Down's syndrome    Dysphagia causing pulmonary aspiration with swallowing    GERD (gastroesophageal reflux disease)    Headache(784.0)    Hydrocephalus    Insomnia    Memory loss    OSA (obstructive sleep apnea)    Pituitary adenoma    Schizoaffective disorder    Weakness     Past Surgical History:  Procedure  Laterality Date   BRAIN SURGERY     CSF SHUNT     TYMPANOSTOMY TUBE PLACEMENT Bilateral    VENTRICULOPERITONEAL SHUNT      Family Psychiatric History: None Reported  Family History:  Family History  Problem Relation Age of Onset   Hypertension Mother    Renal Disease Father    Emphysema Maternal Grandfather        Died at 33    Social History:  Social History   Socioeconomic History   Marital status: Single    Spouse name: Not on file   Number of children: Not on file   Years of education: Not on file   Highest education level: Not on file  Occupational History   Occupation: disabled  Tobacco Use   Smoking status: Never   Smokeless tobacco: Never   Vaping Use   Vaping Use: Never used  Substance and Sexual Activity   Alcohol use: No   Drug use: No   Sexual activity: Never  Other Topics Concern   Not on file  Social History Narrative   ** Merged History Encounter **       Sergio [name] is a 37 year old male. He lives with both of his parents Sergio [name] and Sergio [name]). He has 1 sister,who is 32.  He enjoys making his bed and watching movies. He has 700 movies.  Down's syndrome and schizophrenia - very dependent on his mother, Sergio [name]   Social Determinants of Health   Financial Resource Strain: Medium Risk (05/17/2021)   Overall Financial Resource Strain (CARDIA)    Difficulty of Paying Living Expenses: Somewhat hard  Food Insecurity: Food Insecurity Present (05/17/2021)   Hunger Vital Sign    Worried About Running Out of Food in the Last Year: Sometimes true    Ran Out of Food in the Last Year: Never true  Transportation Needs: No Transportation Needs (05/17/2021)   PRAPARE - Administrator, Civil Service (Medical): No    Lack of Transportation (Non-Medical): No  Physical Activity: Inactive (05/17/2021)   Exercise Vital Sign    Days of Exercise per Week: 0 days    Minutes of Exercise per Session: 0 min  Stress: Stress Concern Present (05/17/2021)   [name] - Occupational Stress Questionnaire    Feeling of Stress : Rather much  Social Connections: Moderately Integrated (05/17/2021)   Social Connection and Isolation Panel [NHANES]    Frequency of Communication with Friends and Family: Never    Frequency of Social Gatherings with Friends and Family: More than three times a week    Attends Religious Services: 1 to 4 times per year    Active Member of [employer] or Organizations: Yes    Attends Banker Meetings: 1 to 4 times per year    Marital Status: Never married    Allergies:  Allergies  Allergen Reactions   Cefaclor Anaphylaxis and Shortness Of Breath   Penicillins Anaphylaxis and Rash     Has patient had a PCN reaction causing immediate rash, facial/tongue/throat swelling, SOB or lightheadedness with hypotension: yes Has patient had a PCN reaction causing severe rash involving mucus membranes or skin necrosis: no Has patient had a PCN reaction that required hospitalization: no Has patient had a PCN reaction occurring within the last 10 years: no If all of the above answers are "NO", then may proceed with Cephalosporin use.    Clindamycin Rash   Clindamycin/Lincomycin Rash    Metabolic Disorder Labs: Lab Results  Component Value Date   HGBA1C 5.5 05/27/2022   MPG 111.15 05/27/2022   Lab Results  Component Value Date   PROLACTIN 51.5 (H) 01/21/2013   No results found for: "CHOL", "TRIG", "HDL", "CHOLHDL", "VLDL", "LDLCALC" Lab Results  Component Value Date   TSH 3.020 01/21/2013    Therapeutic Level Labs: No results found for: "LITHIUM" No results found for: "VALPROATE" No results found for: "CBMZ"  Current Medications: Current Outpatient Medications  Medication Sig Dispense Refill   hydrOXYzine (ATARAX) 50 MG tablet Take 1 tablet (50 mg total) by mouth 3 (three) times daily as needed. 90 tablet 1   azithromycin (ZITHROMAX) 250 MG tablet Take as directed 6 tablet 0   ipratropium-albuterol (DUONEB) 0.5-2.5 (3) MG/3ML SOLN NEBULIZE 1 VIAL EVERY 6 HOURS AS NEEDED FOR WHEEZING OR SHORTNESS OF BREATH (Patient taking differently: Take 3 mLs by nebulization every 6 (six) hours as needed (for shortness of breath or wheezing).) 180 mL 2   ketoconazole (NIZORAL) 2 % cream APPLY TO TO RASH in BETWEEN LEGS DAILY AS NEEDED FOR irritation FOR 14 DAYS UNTIL cleared 120 g 0   ketoconazole (NIZORAL) 2 % shampoo AAA, lather, leave on for 10 minutes. Then rinse off.  Use twice weekly AS needed 120 mL 0   Melatonin 10 MG TABS Take 10 mg by mouth at bedtime.      metroNIDAZOLE (METROCREAM) 0.75 % cream APPLY TO THE AFFECTED AREA(S) ON face TWICE DAILY. needs appointment FOR  refills 45 g 0   paliperidone (INVEGA) 6 MG 24 hr tablet Take 1 tablet (6 mg total) by mouth at bedtime. 30 tablet 2   traZODone (DESYREL) 100 MG tablet Take 2 tablets (200 mg total) by mouth at bedtime. 60 tablet 2   Vitamin D, Ergocalciferol, (DRISDOL) 1.25 MG (50000 UNIT) CAPS capsule Take 1 capsule (50,000 Units total) by mouth every 7 (seven) days. (Patient taking differently: Take 50,000 Units by mouth daily.) 12 capsule 3   No current facility-administered medications for this visit.     Musculoskeletal: Strength & Muscle Tone: within normal limits Gait & Station:  sitting during interview Patient leans: N/A  Psychiatric Specialty Exam: Review of Systems  Psychiatric/Behavioral:  Positive for hallucinations (AVH- chronic). Negative for dysphoric mood, sleep disturbance and suicidal ideas. The patient is not nervous/anxious.     There were no vitals taken for this visit.There is no height or weight on file to calculate BMI.  General Appearance:  just woke up from sleep  Eye Contact:  Fair  Speech:   minimal (baseline)  Volume:  Normal  Mood:   "fine"  Affect:  Flat  Thought Process:  Could not assess  Orientation:  Could not assess  Thought Content: Could not assess  Suicidal Thoughts:  Per mother None  Homicidal Thoughts:  Per mother None  Memory:  Could not assess  Judgement:  Could not assess  Insight:  Could not assess  Psychomotor Activity:  Normal  Concentration:  Concentration: Fair and Attention Span: Fair  Recall:  Could not assess  Fund of Knowledge: Could not assess  Language: Fair  Akathisia:  Could not assess  Handed:  Right  AIMS (if indicated): not done  Assets:  Financial Resources/Insurance Housing Resilience Social Support  ADL's:  Impaired  Cognition: Impaired,  Severe  Sleep:  Good   Screenings: GAD-7    Flowsheet Row Office Visit from 03/05/2021 in [facility]  Total GAD-7 Score 0      PHQ2-9  Flowsheet Row Clinical Support from 05/17/2021 in [facility] Office Visit from 03/05/2021 in [facility] Office Visit from 12/30/2017 in [facility] Office Visit from 11/11/2017 in [facility] Office Visit from 06/30/2017 in [facility]  PHQ-2 Total Score 0 0 0 0 0  PHQ-9 Total Score -- 5 -- -- --      Flowsheet Row ED to Hosp-Admission (Discharged) from 05/26/2022 in [facility]  C-SSRS RISK CATEGORY No Risk        Assessment and Plan:  Casper "Sergio [name]" Dever [name] is a 37 yr old male who presents via Virtual Video Visit to Establish Care and for Medication Management.  PPHx is significant for Paranoid Schizophrenia, Down's Syndrome with Severe Impairment, and has Legal Guardians (parents Sergio [name] and Sergio [name]), no Suicide Attempts, 1 episode of attempting to cut Shunt out of head due to command hallucinations, and no hospitalizations.     "Sergio [name]" continues to do well on his current medication regiment and has even shown improvement in that he is no longer having repetitive actions.  We will not make any changes to his medications at this time.  Refills were sent in.  He will return for follow up in approximately 3 months.   Paranoid Schizophrenia: -Continue Invega 6 mg QHS for psychosis.  30 tablets with 2 refills. -Continue Trazodone 200 mg QHS for insomnia. 60 (100 mg) tablets with 2 refills. -Continue Hydroxyzine 50 mg PRN TID for anxiety.  90 tablets with 1 refill.    Collaboration of Care:   Patient/Guardian was advised Release of Information must be obtained prior to any record release in order to collaborate their care with an outside provider. Patient/Guardian was advised if they have not already done so to contact the registration department to sign all necessary forms in order for  [place] to release information regarding their care.   Consent: Patient/Guardian gives verbal consent for treatment and assignment of benefits for services provided during this visit. Patient/Guardian expressed understanding and agreed to proceed.    Sergio Franklin, MD 08/28/2022, 9:01 AM

## 2022-09-23 ENCOUNTER — Ambulatory Visit (INDEPENDENT_AMBULATORY_CARE_PROVIDER_SITE_OTHER): Payer: Medicare Other

## 2022-09-23 DIAGNOSIS — K219 Gastro-esophageal reflux disease without esophagitis: Secondary | ICD-10-CM

## 2022-09-23 DIAGNOSIS — Q909 Down syndrome, unspecified: Secondary | ICD-10-CM | POA: Diagnosis not present

## 2022-09-23 DIAGNOSIS — J9601 Acute respiratory failure with hypoxia: Secondary | ICD-10-CM | POA: Diagnosis not present

## 2022-09-23 DIAGNOSIS — J90 Pleural effusion, not elsewhere classified: Secondary | ICD-10-CM | POA: Diagnosis not present

## 2022-09-23 DIAGNOSIS — J9811 Atelectasis: Secondary | ICD-10-CM | POA: Diagnosis not present

## 2022-09-23 DIAGNOSIS — G4733 Obstructive sleep apnea (adult) (pediatric): Secondary | ICD-10-CM

## 2022-09-23 DIAGNOSIS — M25561 Pain in right knee: Secondary | ICD-10-CM

## 2022-09-23 DIAGNOSIS — G919 Hydrocephalus, unspecified: Secondary | ICD-10-CM

## 2022-09-23 DIAGNOSIS — F0392 Unspecified dementia, unspecified severity, with psychotic disturbance: Secondary | ICD-10-CM

## 2022-09-23 DIAGNOSIS — F259 Schizoaffective disorder, unspecified: Secondary | ICD-10-CM | POA: Diagnosis not present

## 2022-11-26 ENCOUNTER — Encounter: Payer: Self-pay | Admitting: Family Medicine

## 2022-11-26 ENCOUNTER — Ambulatory Visit (INDEPENDENT_AMBULATORY_CARE_PROVIDER_SITE_OTHER): Payer: Medicaid Other | Admitting: Family Medicine

## 2022-11-26 VITALS — BP 128/66 | HR 69 | Temp 98.1°F | Ht 60.0 in | Wt 238.0 lb

## 2022-11-26 DIAGNOSIS — E7800 Pure hypercholesterolemia, unspecified: Secondary | ICD-10-CM

## 2022-11-26 DIAGNOSIS — L918 Other hypertrophic disorders of the skin: Secondary | ICD-10-CM

## 2022-11-26 DIAGNOSIS — D1801 Hemangioma of skin and subcutaneous tissue: Secondary | ICD-10-CM

## 2022-11-26 DIAGNOSIS — E559 Vitamin D deficiency, unspecified: Secondary | ICD-10-CM

## 2022-11-26 DIAGNOSIS — F209 Schizophrenia, unspecified: Secondary | ICD-10-CM

## 2022-11-26 DIAGNOSIS — Z0000 Encounter for general adult medical examination without abnormal findings: Secondary | ICD-10-CM

## 2022-11-26 DIAGNOSIS — L309 Dermatitis, unspecified: Secondary | ICD-10-CM

## 2022-11-26 DIAGNOSIS — G4710 Hypersomnia, unspecified: Secondary | ICD-10-CM | POA: Diagnosis not present

## 2022-11-26 DIAGNOSIS — G4730 Sleep apnea, unspecified: Secondary | ICD-10-CM

## 2022-11-26 DIAGNOSIS — B351 Tinea unguium: Secondary | ICD-10-CM | POA: Diagnosis not present

## 2022-11-26 DIAGNOSIS — L304 Erythema intertrigo: Secondary | ICD-10-CM

## 2022-11-26 DIAGNOSIS — L719 Rosacea, unspecified: Secondary | ICD-10-CM | POA: Diagnosis not present

## 2022-11-26 MED ORDER — KETOCONAZOLE 2 % EX CREA: TOPICAL_CREAM | Freq: Every day | CUTANEOUS | 99 refills | Status: DC | PRN

## 2022-11-26 MED ORDER — METRONIDAZOLE 0.75 % EX CREA: TOPICAL_CREAM | CUTANEOUS | 99 refills | Status: AC

## 2022-11-26 MED ORDER — CLOTRIMAZOLE-BETAMETHASONE 1-0.05 % EX CREA: 1.0000 | TOPICAL_CREAM | Freq: Two times a day (BID) | CUTANEOUS | 0 refills | Status: AC

## 2022-11-26 MED ORDER — KETOCONAZOLE 2 % EX SHAM: MEDICATED_SHAMPOO | CUTANEOUS | 99 refills | Status: AC

## 2022-11-26 NOTE — Progress Notes (Signed)
Sergio [name] is a 37 y.o. male presents to office today for annual physical exam examination.    Concerns today include: 1.  Rash Patient is brought to the office by his mother.  She notes that he has ongoing issues with rash and this seems to be controlled with consistent use of ketoconazole shampoo and cream.  The rash typically affects the groin area.  She notes he has some issues underneath the arms but this seems to be postinflammatory thickening.  The actual erythematous rash under the arms has totally resolved.  She had multiple skin tags.  She does not report any bleeding of the skin tags.  She is also noticed multiple new red type nevi on the scalp and she wanted to have this further evaluated.  Patient was previously under the care of a dermatologist but has been lost to follow-up.  2.  Abnormal toenail Mother reports abnormal toenail on the left great toe.  She has been trying to keep this cut down and taking care of.  He was previously under the care of Dr. Ulice Brilliant.  She wonders if perhaps the entire toenail can be removed because his foot fungus has been totally refractory to topical treatment.  She notes a rash along the medial aspect of the left foot now as well.  No application of any new creams to that area  Occupation: disabled, Marital status: single, Substance use: none Health Maintenance Due  Topic Date Due   DTaP/Tdap/Td (4 - Tdap) 03/07/1997   Hepatitis C Screening  Never done   COVID-19 Vaccine (2 - Moderna risk series) 05/23/2020   Refills needed today: all Immunizations needed: Immunization History  Administered Date(s) Administered   DTaP 09/17/1988, 10/27/1990, 11/18/1991   HIB (PRP-OMP) 09/17/1988   Hepatitis B 01/24/2000, 03/03/2000, 07/21/2000   IPV 09/17/1988, 10/27/1990, 11/18/1991   Influenza Inj Mdck Quad Pf 07/04/2018   Influenza,inj,Quad PF,6+ Mos 05/11/2013, 03/24/2014, 04/08/2017, 03/05/2021   Influenza-Unspecified 03/24/2014, 02/03/2015,  04/22/2017   MMR 11/18/1991   Moderna Sars-Covid-2 Vaccination 04/25/2020   PPD Test 01/31/2014     Past Medical History:  Diagnosis Date   Dementia (HCC)    Difficulty swallowing    Down's syndrome    Dysphagia causing pulmonary aspiration with swallowing    GERD (gastroesophageal reflux disease)    Headache(784.0)    Hydrocephalus (HCC)    Insomnia    Memory loss    OSA (obstructive sleep apnea)    Pituitary adenoma (HCC)    Schizoaffective disorder    Weakness    Social History   Socioeconomic History   Marital status: Single    Spouse name: Not on file   Number of children: Not on file   Years of education: Not on file   Highest education level: Not on file  Occupational History   Occupation: disabled  Tobacco Use   Smoking status: Never   Smokeless tobacco: Never  Vaping Use   Vaping status: Never Used  Substance and Sexual Activity   Alcohol use: No   Drug use: No   Sexual activity: Never  Other Topics Concern   Not on file  Social History Narrative   ** Merged History Encounter **       Sergio [name] is a 37 year old male. He lives with both of his parents Sergio [name] and Sergio [name]). He has 1 sister,who is 32.  He enjoys making his bed and watching movies. He has 700 movies.  Down's syndrome and schizophrenia - very dependent  on his mother, Sergio [name]   Social Determinants of Health   Financial Resource Strain: Medium Risk (05/17/2021)   Overall Financial Resource Strain (CARDIA)    Difficulty of Paying Living Expenses: Somewhat hard  Food Insecurity: Food Insecurity Present (05/17/2021)   Hunger Vital Sign    Worried About Running Out of Food in the Last Year: Sometimes true    Ran Out of Food in the Last Year: Never true  Transportation Needs: No Transportation Needs (05/17/2021)   PRAPARE - Administrator, Civil Service (Medical): No    Lack of Transportation (Non-Medical): No  Physical Activity: Inactive (05/17/2021)   Exercise Vital Sign    Days of  Exercise per Week: 0 days    Minutes of Exercise per Session: 0 min  Stress: Stress Concern Present (05/17/2021)   [name] - Occupational Stress Questionnaire    Feeling of Stress : Rather much  Social Connections: Moderately Integrated (05/17/2021)   Social Connection and Isolation Panel [NHANES]    Frequency of Communication with Friends and Family: Never    Frequency of Social Gatherings with Friends and Family: More than three times a week    Attends Religious Services: 1 to 4 times per year    Active Member of [employer] or Organizations: Yes    Attends Banker Meetings: 1 to 4 times per year    Marital Status: Never married  Intimate Partner Violence: Not At Risk (05/17/2021)   Humiliation, Afraid, Rape, and Kick questionnaire    Fear of Current or Ex-Partner: No    Emotionally Abused: No    Physically Abused: No    Sexually Abused: No   Past Surgical History:  Procedure Laterality Date   BRAIN SURGERY     CSF SHUNT     TYMPANOSTOMY TUBE PLACEMENT Bilateral    VENTRICULOPERITONEAL SHUNT     Family History  Problem Relation Age of Onset   Hypertension Mother    Renal Disease Father    Emphysema Maternal Grandfather        Died at 13    Current Outpatient Medications:    azithromycin (ZITHROMAX) 250 MG tablet, Take as directed, Disp: 6 tablet, Rfl: 0   hydrOXYzine (ATARAX) 50 MG tablet, Take 1 tablet (50 mg total) by mouth 3 (three) times daily as needed., Disp: 90 tablet, Rfl: 1   ipratropium-albuterol (DUONEB) 0.5-2.5 (3) MG/3ML SOLN, NEBULIZE 1 VIAL EVERY 6 HOURS AS NEEDED FOR WHEEZING OR SHORTNESS OF BREATH (Patient taking differently: Take 3 mLs by nebulization every 6 (six) hours as needed (for shortness of breath or wheezing).), Disp: 180 mL, Rfl: 2   ketoconazole (NIZORAL) 2 % cream, APPLY TO TO RASH in BETWEEN LEGS DAILY AS NEEDED FOR irritation FOR 14 DAYS UNTIL cleared, Disp: 120 g, Rfl: 0   ketoconazole (NIZORAL) 2 %  shampoo, AAA, lather, leave on for 10 minutes. Then rinse off.  Use twice weekly AS needed, Disp: 120 mL, Rfl: 0   Melatonin 10 MG TABS, Take 10 mg by mouth at bedtime. , Disp: , Rfl:    metroNIDAZOLE (METROCREAM) 0.75 % cream, APPLY TO THE AFFECTED AREA(S) ON face TWICE DAILY. needs appointment FOR refills, Disp: 45 g, Rfl: 0   paliperidone (INVEGA) 6 MG 24 hr tablet, Take 1 tablet (6 mg total) by mouth at bedtime., Disp: 30 tablet, Rfl: 2   traZODone (DESYREL) 100 MG tablet, Take 2 tablets (200 mg total) by mouth at bedtime., Disp: 60 tablet, Rfl: 2  Vitamin D, Ergocalciferol, (DRISDOL) 1.25 MG (50000 UNIT) CAPS capsule, Take 1 capsule (50,000 Units total) by mouth every 7 (seven) days. (Patient taking differently: Take 50,000 Units by mouth daily.), Disp: 12 capsule, Rfl: 3  Allergies  Allergen Reactions   Cefaclor Anaphylaxis and Shortness Of Breath   Penicillins Anaphylaxis and Rash    Has patient had a PCN reaction causing immediate rash, facial/tongue/throat swelling, SOB or lightheadedness with hypotension: yes Has patient had a PCN reaction causing severe rash involving mucus membranes or skin necrosis: no Has patient had a PCN reaction that required hospitalization: no Has patient had a PCN reaction occurring within the last 10 years: no If all of the above answers are "NO", then may proceed with Cephalosporin use.    Clindamycin Rash   Clindamycin/Lincomycin Rash     ROS: Review of Systems Pertinent items noted in HPI and remainder of comprehensive ROS otherwise negative.    Physical exam BP 128/66   Pulse 69   Temp 98.1 F (36.7 C)   Ht 5' (1.524 m)   Wt 238 lb (108 kg)   SpO2 97%   BMI 46.48 kg/m  General appearance: alert, cooperative, appears stated age, morbidly obese, and syndromic appearance -   Head: Normocephalic, without obvious abnormality, atraumatic Eyes: negative findings: lids and lashes normal, conjunctivae and sclerae normal, corneas clear, and pupils  equal, round, reactive to light and accomodation Ears: normal TM's and external ear canals both ears Nose: Nares normal. Septum midline. Mucosa normal. No drainage or sinus tenderness. Throat:  Mild caries noted.  Moist mucous membranes with no oropharyngeal masses identified Neck: no adenopathy, supple, symmetrical, trachea midline, and thyroid not enlarged, symmetric, no tenderness/mass/nodules Back:  Increased kyphosis of the thoracic spine present Lungs: clear to auscultation bilaterally Chest wall: no tenderness Heart: regular rate and rhythm, S1, S2 normal, no murmur, click, rub or gallop Abdomen:  Obese, no appreciable hepatosplenomegaly Extremities: extremities normal, atraumatic, no cyanosis or edema Pulses: 2+ and symmetric Skin:  Has hyperkeratotic skin noted along the anterior axilla bilaterally.  He has several skin tags underneath axilla bilaterally.  He has several hemangiomas on the scalp. Lymph nodes: Cervical, supraclavicular, and axillary nodes normal. Neurologic: Follows commands but does not actively interact with provider.  Developmental delay present Psych: Does not appear to be responding to internal stimuli.     11/26/2022   11:34 AM 05/17/2021    2:54 PM 03/05/2021    3:22 PM  Depression screen PHQ 2/9  Decreased Interest 0 0 0  Down, Depressed, Hopeless 0 0 0  PHQ - 2 Score 0 0 0  Altered sleeping 0  0  Tired, decreased energy 1  3  Change in appetite 2  2  Feeling bad or failure about yourself  0  0  Trouble concentrating 0  0  Moving slowly or fidgety/restless 0  0  Suicidal thoughts 0  0  PHQ-9 Score 3  5  Difficult doing work/chores Not difficult at all  Somewhat difficult      11/26/2022   11:34 AM 03/05/2021    3:22 PM  GAD 7 : Generalized Anxiety Score  Nervous, Anxious, on Edge 0 0  Control/stop worrying 0 0  Worry too much - different things 0 0  Trouble relaxing 0 0  Restless 0 0  Easily annoyed or irritable 0 0  Afraid - awful might  happen 0 0  Total GAD 7 Score 0 0  Anxiety Difficulty Not difficult at all Not  difficult at all     Assessment/ Plan: Magdalene Patricia [name] here for annual physical exam.   Annual physical exam  Severe obesity (BMI >= 40) (HCC) - Plan: Bayer DCA Hb A1c Waived, CMP14+EGFR, Lipid Panel, TSH, CANCELED: CMP14+EGFR, CANCELED: Bayer DCA Hb A1c Waived, CANCELED: TSH, CANCELED: Lipid Panel  Pure hypercholesterolemia - Plan: CMP14+EGFR, Lipid Panel, TSH  Hypersomnia with sleep apnea - Plan: CBC, CANCELED: CBC  Schizophrenia, unspecified type (HCC) - Plan: CBC, CMP14+EGFR, Lipid Panel, TSH, CANCELED: CMP14+EGFR, CANCELED: CBC, CANCELED: Lipid Panel  Vitamin D deficiency - Plan: VITAMIN D 25 Hydroxy (Vit-D Deficiency, Fractures), CANCELED: VITAMIN D 25 Hydroxy (Vit-D Deficiency, Fractures)  Dermatitis - Plan: Ambulatory referral to Dermatology  Multiple acquired skin tags - Plan: Ambulatory referral to Dermatology  Hemangioma of skin - Plan: Ambulatory referral to Dermatology  Intertrigo - Plan: ketoconazole (NIZORAL) 2 % cream, Ambulatory referral to Dermatology  Rosacea - Plan: metroNIDAZOLE (METROCREAM) 0.75 % cream, Ambulatory referral to Dermatology  Onychomycosis of great toe - Plan: Ambulatory referral to Podiatry  I have preordered fasting labs.  They attempted to get labs today but unfortunately patient's veins were rolling too much to obtain.  I discussed with mother adequate hydration and to schedule labs at a future date.  I have CCed these labs to the patient's psychiatrist as well for ongoing follow-up.  Check vitamin D level given history of vitamin D deficiency.  Referral to dermatology placed for dermatitis, would appear to be hemangiomas of the skin and multiple acquired skin tags.  I have prescribed him clotrimazole with betamethasone for the foot dermatitis.  Suspect that there is likely a fungal aspect of this.  Discussed how to utilize this medication appropriately.   Referral to podiatry placed for ongoing management of onychomycotic changes in the great toenail.  May be a candidate for toenail removal.  Certainly not ideal for oral antifungals given other medications  Patient to follow up 12 months for annual physical  Katelind Pytel M. Nadine Counts, DO

## 2022-12-23 ENCOUNTER — Other Ambulatory Visit: Payer: Self-pay | Admitting: Family Medicine

## 2022-12-23 DIAGNOSIS — L304 Erythema intertrigo: Secondary | ICD-10-CM

## 2022-12-23 MED ORDER — KETOCONAZOLE 2 % EX CREA
TOPICAL_CREAM | CUTANEOUS | 99 refills | Status: AC
Start: 2022-12-23 — End: ?

## 2023-02-07 ENCOUNTER — Telehealth (HOSPITAL_COMMUNITY): Payer: Self-pay

## 2023-02-07 NOTE — Telephone Encounter (Signed)
Patients pharmacy is calling for a refill on patients Invega. I advised them that patients mother would need to schedule a follow up since he has not been seen since April. Pharmacist was going to call mom. Please review and advise, thank you

## 2023-02-10 ENCOUNTER — Telehealth (HOSPITAL_COMMUNITY): Payer: Self-pay

## 2023-02-10 DIAGNOSIS — F5101 Primary insomnia: Secondary | ICD-10-CM

## 2023-02-10 DIAGNOSIS — F209 Schizophrenia, unspecified: Secondary | ICD-10-CM

## 2023-02-10 MED ORDER — TRAZODONE HCL 100 MG PO TABS
200.0000 mg | ORAL_TABLET | Freq: Every day | ORAL | 1 refills | Status: DC
Start: 2023-02-10 — End: 2023-03-31

## 2023-02-10 MED ORDER — HYDROXYZINE HCL 50 MG PO TABS
50.0000 mg | ORAL_TABLET | Freq: Three times a day (TID) | ORAL | 0 refills | Status: DC | PRN
Start: 2023-02-10 — End: 2023-03-31

## 2023-02-10 MED ORDER — PALIPERIDONE ER 6 MG PO TB24
6.0000 mg | ORAL_TABLET | Freq: Every day | ORAL | 1 refills | Status: DC
Start: 2023-02-10 — End: 2023-03-31

## 2023-02-10 NOTE — Telephone Encounter (Signed)
Received message that patient needed refill of his medications.  He has been contacted by staff to schedule a new appointment.  A 2 month supply of his medications have been sent in to bridge until follow up appointment.   Sent: -Invega 6 mg QHS for psychosis.  30 tablets with 1 refill. -Trazodone 200 mg QHS for insomnia. 60 (100 mg) tablets with 1 refill. -Hydroxyzine 50 mg PRN TID for anxiety.  90 tablets with 0 refills.    Arna Snipe MD Resident

## 2023-02-10 NOTE — Telephone Encounter (Signed)
This is a former patient of Dr. Lucianne Muss, he last saw you in April and mom was told to make a follow up for July. This was never done. She is asking for medication refills - I gave patients name to the front desk to call and get him scheduled. Are you okay with refilling? Patient takes Hydroxyzine, Invega and Trazodone. Please review and advise, thank you

## 2023-03-31 ENCOUNTER — Telehealth (HOSPITAL_BASED_OUTPATIENT_CLINIC_OR_DEPARTMENT_OTHER): Payer: Medicare Other | Admitting: Family

## 2023-03-31 DIAGNOSIS — F209 Schizophrenia, unspecified: Secondary | ICD-10-CM | POA: Diagnosis not present

## 2023-03-31 DIAGNOSIS — F5101 Primary insomnia: Secondary | ICD-10-CM | POA: Diagnosis not present

## 2023-03-31 MED ORDER — PALIPERIDONE ER 6 MG PO TB24
6.0000 mg | ORAL_TABLET | Freq: Every day | ORAL | 1 refills | Status: DC
Start: 2023-03-31 — End: 2023-06-18

## 2023-03-31 MED ORDER — HYDROXYZINE HCL 50 MG PO TABS
50.0000 mg | ORAL_TABLET | Freq: Three times a day (TID) | ORAL | 0 refills | Status: DC | PRN
Start: 2023-03-31 — End: 2023-06-18

## 2023-03-31 MED ORDER — TRAZODONE HCL 100 MG PO TABS
200.0000 mg | ORAL_TABLET | Freq: Every day | ORAL | 1 refills | Status: DC
Start: 2023-03-31 — End: 2023-06-18

## 2023-03-31 NOTE — Progress Notes (Unsigned)
Psychiatric Initial Adult Assessment   Patient Identification: Sergio [name] MRN:  [id number] Date of Evaluation:  03/31/2023 Referral Source: Transfer care from psychiatrist Lucianne Muss Chief Complaint: Patient's caregiver/legal guardian/mother denied any concerns at this visit.  Visit Diagnosis:    ICD-10-CM   1. Schizophrenia, unspecified type (HCC)  F20.9 paliperidone (INVEGA) 6 MG 24 hr tablet    hydrOXYzine (ATARAX) 50 MG tablet    2. Primary insomnia  F51.01 traZODone (DESYREL) 100 MG tablet      History of Present Illness: Sergio [name] " [name]" 37 year old Caucasian male presents to establish care.  Patient was seen via video assessment she is he is accompanied by Karin Golden.  Mother reports patient  carries a diagnosis with schizophrenia and Down syndrome.  States he has been followed by psychiatrist Lucianne Muss for the past 2+ years.  States he has been on the same medication regimen.  He is currently prescribed Invega 6 mg trazodone 200 mg close taking hydroxyzine 25 mg "sparingly."  Patient's mother denied any sleeping hygiene.  Does report occasionally patient with round 2 or 3 AM requesting breakfast however will go back to sleep.   No documented previous inpatient admissions.  Was charted that patient had a previous suicide attempt to remove the shunt.  Mother reports patient has an extensive medical history related to hide encephalopathy, pneumonia and GERD symptoms.  States most of his mental goal history has stabilized with states he was recently discharged from intensive care due to.  Chart reviewed EKG: 335/435-qtc  Very minimal/limited with speech as he states " fine" to most questions asked.  However, is responsive to his name.  Does not appear to be responding to internal or external stimuli.  Smiles occasionally throughout this assessment. Discussed following up with primary care provider at [facility] primary care.  Orders placed for appropriate CBC, CMP, TSH,  thyroid and A1c panel, reported patient " difficult stick."  As she reports patient's veins tend to collapse with blood collection.  -Per admission assessment note: "[name] "[name]" Code [name] is a 37 yr old male PPHx is significant for Paranoid Schizophrenia, Down's Syndrome with Severe Impairment, and has Civil Service fast streamer (parents [place] and [employer]), no Suicide Attempts, 1 episode of attempting to cut Shunt out of head due to command hallucinations, and no hospitalizations."    Paranoid Schizophrenia:  As documented per previous assessment notes: -Continue Invega 6 mg QHS for psychosis.  30 tablets with 2 refills. -Continue Trazodone 200 mg QHS for insomnia. 60 (100 mg) tablets with 2 refills sent. -Continue Hydroxyzine 50 mg PRN TID for anxiety.  90 tablets with 1 refill  Associated Signs/Symptoms: Historic information provided by patient's legal guardian his mother.  She denied any concerns related to depression or depressive symptoms.  Depression Symptoms:  anxiety, (Hypo) Manic Symptoms:   N/A Anxiety Symptoms:   N/A Psychotic Symptoms:   N/A PTSD Symptoms: NA  Past Psychiatric History: See HPI  Previous Psychotropic Medications: Yes   Substance Abuse History in the last 12 months:  No.  Consequences of Substance Abuse: NA  Past Medical History:  Past Medical History:  Diagnosis Date   Dementia (HCC)    Difficulty swallowing    Down's syndrome    Dysphagia causing pulmonary aspiration with swallowing    GERD (gastroesophageal reflux disease)    Headache(784.0)    Hydrocephalus (HCC)    Insomnia    Memory loss    OSA (obstructive sleep apnea)    Pituitary adenoma (HCC)  Schizoaffective disorder    Weakness     Past Surgical History:  Procedure Laterality Date   BRAIN SURGERY     CSF SHUNT     TYMPANOSTOMY TUBE PLACEMENT Bilateral    VENTRICULOPERITONEAL SHUNT      Family Psychiatric History:   Family History:  Family History  Problem Relation Age of  Onset   Hypertension Mother    Renal Disease Father    Emphysema Maternal Grandfather        Died at 70    Social History:   Social History   Socioeconomic History   Marital status: Single    Spouse name: Not on file   Number of children: Not on file   Years of education: Not on file   Highest education level: Not on file  Occupational History   Occupation: disabled  Tobacco Use   Smoking status: Never   Smokeless tobacco: Never  Vaping Use   Vaping status: Never Used  Substance and Sexual Activity   Alcohol use: No   Drug use: No   Sexual activity: Never  Other Topics Concern   Not on file  Social History Narrative   ** Merged History Encounter **       Sergio [name] is a 37 year old male. He lives with both of his parents Aurther Loft and [place]). He has 1 sister,who is 32.  He enjoys making his bed and watching movies. He has 700 movies.  Down's syndrome and schizophrenia - very dependent on his mother, Aurther Loft   Social Determinants of Health   Financial Resource Strain: Medium Risk (05/17/2021)   Overall Financial Resource Strain (CARDIA)    Difficulty of Paying Living Expenses: Somewhat hard  Food Insecurity: Food Insecurity Present (05/17/2021)   Hunger Vital Sign    Worried About Running Out of Food in the Last Year: Sometimes true    Ran Out of Food in the Last Year: Never true  Transportation Needs: No Transportation Needs (05/17/2021)   PRAPARE - Administrator, Civil Service (Medical): No    Lack of Transportation (Non-Medical): No  Physical Activity: Inactive (05/17/2021)   Exercise Vital Sign    Days of Exercise per Week: 0 days    Minutes of Exercise per Session: 0 min  Stress: Stress Concern Present (05/17/2021)   [name] - Occupational Stress Questionnaire    Feeling of Stress : Rather much  Social Connections: Moderately Integrated (05/17/2021)   Social Connection and Isolation Panel [NHANES]    Frequency of Communication  with Friends and Family: Never    Frequency of Social Gatherings with Friends and Family: More than three times a week    Attends Religious Services: 1 to 4 times per year    Active Member of [employer] or Organizations: Yes    Attends Banker Meetings: 1 to 4 times per year    Marital Status: Never married    Additional Social History:   Allergies:   Allergies  Allergen Reactions   Cefaclor Anaphylaxis and Shortness Of Breath   Penicillins Anaphylaxis and Rash    Has patient had a PCN reaction causing immediate rash, facial/tongue/throat swelling, SOB or lightheadedness with hypotension: yes Has patient had a PCN reaction causing severe rash involving mucus membranes or skin necrosis: no Has patient had a PCN reaction that required hospitalization: no Has patient had a PCN reaction occurring within the last 10 years: no If all of the above answers are "NO", then may  proceed with Cephalosporin use.    Clindamycin Rash   Clindamycin/Lincomycin Rash    Metabolic Disorder Labs: Lab Results  Component Value Date   HGBA1C 5.5 05/27/2022   MPG 111.15 05/27/2022   Lab Results  Component Value Date   PROLACTIN 51.5 (H) 01/21/2013   No results found for: "CHOL", "TRIG", "HDL", "CHOLHDL", "VLDL", "LDLCALC" Lab Results  Component Value Date   TSH 3.020 01/21/2013    Therapeutic Level Labs: No results found for: "LITHIUM" No results found for: "CBMZ" No results found for: "VALPROATE"  Current Medications: Current Outpatient Medications  Medication Sig Dispense Refill   clotrimazole-betamethasone (LOTRISONE) cream Apply 1 Application topically 2 (two) times daily. X10-14 days as needed for foot 30 g 0   hydrOXYzine (ATARAX) 50 MG tablet Take 1 tablet (50 mg total) by mouth 3 (three) times daily as needed. 90 tablet 0   ipratropium-albuterol (DUONEB) 0.5-2.5 (3) MG/3ML SOLN NEBULIZE 1 VIAL EVERY 6 HOURS AS NEEDED FOR WHEEZING OR SHORTNESS OF BREATH (Patient taking  differently: Take 3 mLs by nebulization every 6 (six) hours as needed (for shortness of breath or wheezing).) 180 mL 2   ketoconazole (NIZORAL) 2 % cream Apply 0.5gm to affected area once daily for up to 6 weeks per fungal rash flareup 120 g PRN   ketoconazole (NIZORAL) 2 % shampoo AAA, lather, leave on for 10 minutes. Then rinse off.  Use twice weekly AS needed 120 mL PRN   Melatonin 10 MG TABS Take 10 mg by mouth at bedtime.      metroNIDAZOLE (METROCREAM) 0.75 % cream APPLY TO THE AFFECTED AREA(S) ON face TWICE DAILY. 45 g PRN   paliperidone (INVEGA) 6 MG 24 hr tablet Take 1 tablet (6 mg total) by mouth at bedtime. 30 tablet 1   traZODone (DESYREL) 100 MG tablet Take 2 tablets (200 mg total) by mouth at bedtime. 60 tablet 1   Vitamin D, Ergocalciferol, (DRISDOL) 1.25 MG (50000 UNIT) CAPS capsule Take 1 capsule (50,000 Units total) by mouth every 7 (seven) days. (Patient taking differently: Take 50,000 Units by mouth daily.) 12 capsule 3   No current facility-administered medications for this visit.    Musculoskeletal: Tele- assessment   Psychiatric Specialty Exam: Review of Systems  There were no vitals taken for this visit.There is no height or weight on file to calculate BMI.  General Appearance: Casual  Eye Contact:  Fair  Speech:   Minimal limited reported 2 word phrases such as "good" and "yes"  Volume:  Normal  Mood:  Euthymic  Affect:  Congruent  Thought Process:  NA  Orientation:  NA  Thought Content:  NA  Suicidal Thoughts:   Mother denied any documented concerns related to safety suicidal ideations auditory visual hallucinations  Homicidal Thoughts:   n/a  Memory:  NA  Judgement:  NA  Insight:  Lacking  Psychomotor Activity:  NA  Concentration:  Concentration: NA  Recall:  NA  Fund of Knowledge:NA  Language: Poor  Akathisia:  NA  Handed:  Right  AIMS (if indicated):  not done  Assets:  Physical Health Social Support  ADL's:  Intact  Cognition: Impaired,  Severe   Sleep:  Fair   Screenings: GAD-7    Flowsheet Row Office Visit from 11/26/2022 in [facility] Office Visit from 03/05/2021 in [facility]  Total GAD-7 Score 0 0      PHQ2-9    Flowsheet Row Office Visit from 11/26/2022 in [facility] Clinical Support from 05/17/2021 in [facility] Office Visit from 03/05/2021 in [facility] Office Visit from 12/30/2017 in [facility] Office Visit from 11/11/2017 in [facility]  PHQ-2 Total Score 0 0 0 0 0  PHQ-9 Total Score 3 -- 5 -- --      Flowsheet Row ED to Hosp-Admission (Discharged) from 05/26/2022 in [place] [facility]  C-SSRS RISK CATEGORY No Risk       Assessment and Plan: Eeshan " [name]" Mcevers presents for transition of care.  States he has been followed by psychiatrist Lucianne Muss for the past 20+ years.  Carries a diagnosis related to schizophrenia, Down syndrome disorder.  Currently he is prescribed Invega 6 mg nightly, trazodone 200 mg for insomnia and hydroxyzine 50 mg for anxiety.  Mother reports patient has not been taking hydroxyzine as directed.  States has been using hydroxyzine rarely and his mood has improved greatly.  No documented concerns related to safety i.e. suicidal or homicidal plans thoughts or intent.  Schizophrenia paranoia Continue Invega 6 mg p.o. nightly Continue trazodone 200 mg p.o.  Chest Continue hydroxyzine as needed  Encouraged follow-up for A1c CBC CMP lipid panel reported recent EKG roughly 8 months prior.  Chart reviewed QTc interval -Follow-up 3 months for medication management  Collaboration of Care: Medication Management AEB will continue current medication  Patient/Guardian was advised Release of Information must be obtained prior to any record  release in order to collaborate their care with an outside provider. Patient/Guardian was advised if they have not already done so to contact the registration department to sign all necessary forms in order for [place] to release information regarding their care.   Consent: Patient/Guardian gives verbal consent for treatment and assignment of benefits for services provided during this visit. Patient/Guardian expressed understanding and agreed to proceed.   Oneta Rack, NP 11/25/20244:56 PM

## 2023-04-01 ENCOUNTER — Encounter (HOSPITAL_COMMUNITY): Payer: Self-pay | Admitting: Family

## 2023-06-12 ENCOUNTER — Other Ambulatory Visit (HOSPITAL_COMMUNITY): Payer: Self-pay | Admitting: Family

## 2023-06-12 DIAGNOSIS — F209 Schizophrenia, unspecified: Secondary | ICD-10-CM

## 2023-06-18 ENCOUNTER — Other Ambulatory Visit (HOSPITAL_COMMUNITY): Payer: Self-pay

## 2023-06-18 DIAGNOSIS — F5101 Primary insomnia: Secondary | ICD-10-CM

## 2023-06-18 DIAGNOSIS — F209 Schizophrenia, unspecified: Secondary | ICD-10-CM

## 2023-06-18 MED ORDER — TRAZODONE HCL 100 MG PO TABS: 200.0000 mg | ORAL_TABLET | Freq: Every day | ORAL | 0 refills | Status: DC

## 2023-06-18 MED ORDER — PALIPERIDONE ER 6 MG PO TB24
6.0000 mg | ORAL_TABLET | Freq: Every day | ORAL | 0 refills | Status: DC
Start: 2023-06-18 — End: 2023-06-30

## 2023-06-18 MED ORDER — HYDROXYZINE HCL 50 MG PO TABS
50.0000 mg | ORAL_TABLET | Freq: Three times a day (TID) | ORAL | 0 refills | Status: DC | PRN
Start: 2023-06-18 — End: 2023-06-30

## 2023-06-30 ENCOUNTER — Telehealth (HOSPITAL_COMMUNITY): Payer: Medicare Other | Admitting: Family

## 2023-06-30 DIAGNOSIS — F5101 Primary insomnia: Secondary | ICD-10-CM

## 2023-06-30 DIAGNOSIS — F209 Schizophrenia, unspecified: Secondary | ICD-10-CM | POA: Diagnosis not present

## 2023-06-30 MED ORDER — PALIPERIDONE ER 6 MG PO TB24: 6.0000 mg | ORAL_TABLET | Freq: Every day | ORAL | 2 refills | Status: DC

## 2023-06-30 MED ORDER — TRAZODONE HCL 100 MG PO TABS: 200.0000 mg | ORAL_TABLET | Freq: Every day | ORAL | 2 refills | Status: DC

## 2023-06-30 MED ORDER — HYDROXYZINE HCL 50 MG PO TABS: 50.0000 mg | ORAL_TABLET | Freq: Three times a day (TID) | ORAL | 2 refills | Status: AC | PRN

## 2023-06-30 NOTE — Progress Notes (Signed)
 BH MD/PA/NP OP Progress Note  06/30/2023 1:32 PM ZAYDRIAN BATTA III  MRN:  [id number]  Chief Complaint: medication management   Lyndel Sarate " [name]" carries a diagnosis related to schizophrenia, down syndrome and generalized anxiety disorder.  Patient's mother Numair Masden reported patient has been doing well.  States last night he did not have a very good night so he is currently resting.  No additional concerns noted at this visit..  Reports he continues to wake up roughly about 3 AM requesting food however states she does not believe that he has concept of time.  Reports a good appetite.  No behavioral disturbance.  No concerns related to safety to include suicidal or homicidal ideations.  No history related to self injures behaviors.  Reports patient was recently seen and evaluated by his primary care pending results for basic metabolic panel, thyroid disorder and A1c.  Medications was refilled at this visit.  Patient to continue paliperidone 6 mg nightly trazodone 200 mg nightly and hydroxyzine 25 to 50 mg 3 times daily as needed.  No inpatient admissions since last office visit.  Schizophrenia:  Down syndrome: Generalized anxiety disorder: As documented per previous assessment notes: -Continue Invega 6 mg QHS for psychosis.  30 tablets with 2 refills. -Continue Trazodone 200 mg QHS for insomnia. 60 (100 mg) tablets with 2 refills sent. -Continue Hydroxyzine 50 mg PRN TID for anxiety.  90 tablets with 1 refill  HPI:  Visit Diagnosis:    ICD-10-CM   1. Schizophrenia, unspecified type (HCC)  F20.9 paliperidone (INVEGA) 6 MG 24 hr tablet    hydrOXYzine (ATARAX) 50 MG tablet    2. Primary insomnia  F51.01 traZODone (DESYREL) 100 MG tablet      Past Psychiatric History: See chart  Past Medical History:  Past Medical History:  Diagnosis Date   Dementia (HCC)    Difficulty swallowing    Down's syndrome    Dysphagia causing pulmonary aspiration with swallowing    GERD  (gastroesophageal reflux disease)    Headache(784.0)    Hydrocephalus (HCC)    Insomnia    Memory loss    OSA (obstructive sleep apnea)    Pituitary adenoma (HCC)    Schizoaffective disorder    Weakness     Past Surgical History:  Procedure Laterality Date   BRAIN SURGERY     CSF SHUNT     TYMPANOSTOMY TUBE PLACEMENT Bilateral    VENTRICULOPERITONEAL SHUNT      Family Psychiatric History: See chart  Family History:  Family History  Problem Relation Age of Onset   Hypertension Mother    Renal Disease Father    Emphysema Maternal Grandfather        Died at 5    Social History:  Social History   Socioeconomic History   Marital status: Single    Spouse name: Not on file   Number of children: Not on file   Years of education: Not on file   Highest education level: Not on file  Occupational History   Occupation: disabled  Tobacco Use   Smoking status: Never   Smokeless tobacco: Never  Vaping Use   Vaping status: Never Used  Substance and Sexual Activity   Alcohol use: No   Drug use: No   Sexual activity: Never  Other Topics Concern   Not on file  Social History Narrative   ** Merged History Encounter **       Dnaiel is a 38 year old male. He lives with  both of his parents Aurther Loft and [place]). He has 1 sister,who is 32.  He enjoys making his bed and watching movies. He has 700 movies.  Down's syndrome and schizophrenia - very dependent on his mother, Aurther Loft   Social Drivers of Health   Financial Resource Strain: Medium Risk (05/17/2021)   Overall Financial Resource Strain (CARDIA)    Difficulty of Paying Living Expenses: Somewhat hard  Food Insecurity: Food Insecurity Present (05/17/2021)   Hunger Vital Sign    Worried About Running Out of Food in the Last Year: Sometimes true    Ran Out of Food in the Last Year: Never true  Transportation Needs: No Transportation Needs (05/17/2021)   PRAPARE - Administrator, Civil Service (Medical): No    Lack of  Transportation (Non-Medical): No  Physical Activity: Inactive (05/17/2021)   Exercise Vital Sign    Days of Exercise per Week: 0 days    Minutes of Exercise per Session: 0 min  Stress: Stress Concern Present (05/17/2021)   [name] - Occupational Stress Questionnaire    Feeling of Stress : Rather much  Social Connections: Moderately Integrated (05/17/2021)   Social Connection and Isolation Panel [NHANES]    Frequency of Communication with Friends and Family: Never    Frequency of Social Gatherings with Friends and Family: More than three times a week    Attends Religious Services: 1 to 4 times per year    Active Member of [employer] or Organizations: Yes    Attends Banker Meetings: 1 to 4 times per year    Marital Status: Never married    Allergies:  Allergies  Allergen Reactions   Cefaclor Anaphylaxis and Shortness Of Breath   Penicillins Anaphylaxis and Rash    Has patient had a PCN reaction causing immediate rash, facial/tongue/throat swelling, SOB or lightheadedness with hypotension: yes Has patient had a PCN reaction causing severe rash involving mucus membranes or skin necrosis: no Has patient had a PCN reaction that required hospitalization: no Has patient had a PCN reaction occurring within the last 10 years: no If all of the above answers are "NO", then may proceed with Cephalosporin use.    Clindamycin Rash   Clindamycin/Lincomycin Rash    Metabolic Disorder Labs: Lab Results  Component Value Date   HGBA1C 5.5 05/27/2022   MPG 111.15 05/27/2022   Lab Results  Component Value Date   PROLACTIN 51.5 (H) 01/21/2013   No results found for: "CHOL", "TRIG", "HDL", "CHOLHDL", "VLDL", "LDLCALC" Lab Results  Component Value Date   TSH 3.020 01/21/2013    Therapeutic Level Labs: No results found for: "LITHIUM" No results found for: "VALPROATE" No results found for: "CBMZ"  Current Medications: Current Outpatient Medications   Medication Sig Dispense Refill   clotrimazole-betamethasone (LOTRISONE) cream Apply 1 Application topically 2 (two) times daily. X10-14 days as needed for foot 30 g 0   hydrOXYzine (ATARAX) 50 MG tablet Take 1 tablet (50 mg total) by mouth 3 (three) times daily as needed. 90 tablet 2   ipratropium-albuterol (DUONEB) 0.5-2.5 (3) MG/3ML SOLN NEBULIZE 1 VIAL EVERY 6 HOURS AS NEEDED FOR WHEEZING OR SHORTNESS OF BREATH (Patient taking differently: Take 3 mLs by nebulization every 6 (six) hours as needed (for shortness of breath or wheezing).) 180 mL 2   ketoconazole (NIZORAL) 2 % cream Apply 0.5gm to affected area once daily for up to 6 weeks per fungal rash flareup 120 g PRN   ketoconazole (NIZORAL) 2 % shampoo  AAA, lather, leave on for 10 minutes. Then rinse off.  Use twice weekly AS needed 120 mL PRN   Melatonin 10 MG TABS Take 10 mg by mouth at bedtime.      metroNIDAZOLE (METROCREAM) 0.75 % cream APPLY TO THE AFFECTED AREA(S) ON face TWICE DAILY. 45 g PRN   paliperidone (INVEGA) 6 MG 24 hr tablet Take 1 tablet (6 mg total) by mouth at bedtime. 90 tablet 2   traZODone (DESYREL) 100 MG tablet Take 2 tablets (200 mg total) by mouth at bedtime. 90 tablet 2   Vitamin D, Ergocalciferol, (DRISDOL) 1.25 MG (50000 UNIT) CAPS capsule Take 1 capsule (50,000 Units total) by mouth every 7 (seven) days. (Patient taking differently: Take 50,000 Units by mouth daily.) 12 capsule 3   No current facility-administered medications for this visit.     Musculoskeletal: Video assessment virtual  Psychiatric Specialty Exam: Review of Systems  Psychiatric/Behavioral:  Positive for sleep disturbance. Negative for agitation and behavioral problems. The patient is nervous/anxious.   All other systems reviewed and are negative.   There were no vitals taken for this visit.There is no height or weight on file to calculate BMI.  General Appearance: NA  Eye Contact:  NA  Speech:  NA was reported patient is mostly  nonverbal, will answer yes or no to most questions asked  Volume:   n/a  Mood:  NA  Affect:  NA  Thought Process:  NA  Orientation:  NA  Thought Content: NA   Suicidal Thoughts:   Mother legal guardian denied any safety concerns related to self injures behaviors or suicidal ideations  Homicidal Thoughts:  No  Memory:  NA  Judgement:  NA  Insight:  NA  Psychomotor Activity:  NA  Concentration:  Concentration: NA  Recall:  NA  Fund of Knowledge: NA  Language: NA  Akathisia:  NA  Handed:  Right  AIMS (if indicated): n/a  Assets:  Others:  N/A  ADL's: n/a  Cognition: n/a  Sleep:  Fair   Screenings: GAD-7    Flowsheet Row Office Visit from 11/26/2022 in [facility] Office Visit from 03/05/2021 in [facility]  Total GAD-7 Score 0 0      PHQ2-9    Flowsheet Row Office Visit from 11/26/2022 in [facility] Clinical Support from 05/17/2021 in [facility] Office Visit from 03/05/2021 in [facility] Office Visit from 12/30/2017 in [facility] Office Visit from 11/11/2017 in [facility]  PHQ-2 Total Score 0 0 0 0 0  PHQ-9 Total Score 3 -- 5 -- --      Flowsheet Row ED to Hosp-Admission (Discharged) from 05/26/2022 in [place] [facility]  C-SSRS RISK CATEGORY No Risk        Assessment and Plan: [name]'s legal guardian Aurther Loft mother on video call.  Reported patient's resting, due to having a difficult night of sleeping.  No behavioral concerns reported.  She reports he has been taking and tolerating medications well.  Currently prescribed Invega, trazodone and hydroxyzine.  Discussed following up 3 months for medication management.   Collaboration of Care: Collaboration of Care: Other outpatient labs TSH, CBC, A1c, prolactin and  EKG.  Patient/Guardian was advised Release of Information must be obtained prior to any record release in order to collaborate their care with an outside provider. Patient/Guardian was  advised if they have not already done so to contact the registration department to sign all necessary forms in order for [place] to release information regarding their care.   Consent: Patient/Guardian gives verbal consent for treatment and assignment of benefits for services provided during this visit. Patient/Guardian expressed understanding and agreed to proceed.    Oneta Rack, NP 06/30/2023, 1:32 PM

## 2023-10-16 ENCOUNTER — Other Ambulatory Visit (HOSPITAL_COMMUNITY): Payer: Self-pay | Admitting: Family

## 2023-10-16 DIAGNOSIS — F209 Schizophrenia, unspecified: Secondary | ICD-10-CM

## 2024-03-19 ENCOUNTER — Other Ambulatory Visit: Payer: Self-pay | Admitting: *Deleted

## 2024-03-19 DIAGNOSIS — L719 Rosacea, unspecified: Secondary | ICD-10-CM

## 2024-03-19 DIAGNOSIS — L304 Erythema intertrigo: Secondary | ICD-10-CM

## 2024-03-19 NOTE — Telephone Encounter (Signed)
 [facility] NT[employer] last OV 11/26/22 NO RF sent to pharmacy last OV greater than a year

## 2024-03-22 ENCOUNTER — Encounter: Payer: Self-pay | Admitting: Family Medicine

## 2024-03-22 NOTE — Telephone Encounter (Signed)
 Letter sent

## 2024-04-05 ENCOUNTER — Other Ambulatory Visit (HOSPITAL_COMMUNITY): Payer: Self-pay | Admitting: Family

## 2024-04-05 DIAGNOSIS — F209 Schizophrenia, unspecified: Secondary | ICD-10-CM

## 2024-04-21 ENCOUNTER — Other Ambulatory Visit (HOSPITAL_COMMUNITY): Payer: Self-pay

## 2024-04-21 DIAGNOSIS — F209 Schizophrenia, unspecified: Secondary | ICD-10-CM

## 2024-04-21 DIAGNOSIS — F5101 Primary insomnia: Secondary | ICD-10-CM

## 2024-04-21 MED ORDER — PALIPERIDONE ER 6 MG PO TB24: 6.0000 mg | ORAL_TABLET | Freq: Every day | ORAL | 0 refills | Status: DC

## 2024-04-21 MED ORDER — TRAZODONE HCL 100 MG PO TABS: 200.0000 mg | ORAL_TABLET | Freq: Every day | ORAL | 0 refills | Status: DC

## 2024-04-27 ENCOUNTER — Telehealth (HOSPITAL_COMMUNITY): Admitting: Family

## 2024-04-27 DIAGNOSIS — F209 Schizophrenia, unspecified: Secondary | ICD-10-CM | POA: Diagnosis not present

## 2024-04-27 DIAGNOSIS — F5101 Primary insomnia: Secondary | ICD-10-CM | POA: Diagnosis not present

## 2024-04-27 MED ORDER — PALIPERIDONE ER 9 MG PO TB24: 9.0000 mg | ORAL_TABLET | Freq: Every day | ORAL | 0 refills | Status: AC

## 2024-04-27 MED ORDER — TRAZODONE HCL 100 MG PO TABS: 200.0000 mg | ORAL_TABLET | Freq: Every day | ORAL | 0 refills | Status: AC

## 2024-04-27 NOTE — Progress Notes (Addendum)
 " Virtual Visit via Video Note  I connected with Sergio [name] on 04/27/2024 at 12:00 PM EST by a video enabled telemedicine application and verified that I am speaking with the correct person using two identifiers.  Location: Patient: Home Provider: Office   I discussed the limitations of evaluation and management by telemedicine and the availability of in person appointments. The patient expressed understanding and agreed to proceed.   I discussed the assessment and treatment plan with the patient. The patient was provided an opportunity to ask questions and all were answered. The patient agreed with the plan and demonstrated an understanding of the instructions.   The patient was advised to call back or seek an in-person evaluation if the symptoms worsen or if the condition fails to improve as anticipated.  I provided 20 minutes of non-face-to-face time during this encounter.   Staci LOISE Kerns, NP   [facility] MD/PA/NP OP Progress Note  04/27/2024 12:27 PM Sergio [name]  MRN:  [id number]  Chief Complaint: Medication Management   Sergio [name]  [name] is a 38 year old male who presents for medication management follow-up appointment.  Significant past psychiatric history related to schizophrenia, generalized anxiety disorder and Down syndrome with severe impairment.  Patient minimally verbal at baseline.  Patient seen and evaluated by his mother who is his legal guardian Sergio [name] reported patient has been doing well  for the most part.  Sergio [name] reports concerns that patient is experiencing auditory and visual hallucinations.  Reported over the past 6 weeks patient has been experiencing and appears to be bothered by entities.  States she observed patient  swatting and responding to internal stimuli.  Sergio [name] stated that patient has been prescribed Invega  6 mg for over 6+ years inquired about titrating medication upward to help with perceived hallucinations.  Reports a fair  appetite.  States he is resting well throughout the night..  She reports patient has a follow-up appointment with primary care provider at [facility] internal medicine.    For annual physical.  Please include EKG, A1c, prolactin, vitamin D , TSH and lipid panel due to prolonged use of psychotropic medications.  Continues to deny no safety concerns related to suicidal or homicidal ideations.  Follow-up 2 months for medication adherence/tolerability.  Medications was refilled at this visit.  Patient to continue increase paliperidone  6 mg to paliperidone  9 mg nightly and continue trazodone  200 mg nightly and hydroxyzine  25 to 50 mg 3 times daily as needed.  Denied inpatient admissions since last office visit.  Schizophrenia:  Down syndrome: Generalized anxiety disorder:  Increased Invega  6 mg to Invega  9 QHS for psychosis.  30 tablets with 2 refills. -Continue Trazodone  200 mg QHS for insomnia. 60 (100 mg) tablets with 2 refills sent. -Continue Hydroxyzine  50 mg PRN TID for anxiety.  90 tablets with 1 refill   Visit Diagnosis:    ICD-10-CM   1. Schizophrenia, unspecified type (HCC)  F20.9     2. Primary insomnia  F51.01        Past Psychiatric History: Information copied from chart [employer] [name]  PPHx is significant for Paranoid Schizophrenia, Down's Syndrome with Severe Impairment, and has Civil Service [employer] (parents Sergio [name] and Sergio [name].   Past Medical History:  Past Medical History:  Diagnosis Date   Dementia (HCC)    Difficulty swallowing    Down's syndrome    Dysphagia causing pulmonary aspiration with swallowing    GERD (gastroesophageal reflux disease)    Headache(784.0)  Hydrocephalus (HCC)    Insomnia    Memory loss    OSA (obstructive sleep apnea)    Pituitary adenoma (HCC)    Schizoaffective disorder    Weakness     Past Surgical History:  Procedure Laterality Date   BRAIN SURGERY     CSF SHUNT     TYMPANOSTOMY TUBE PLACEMENT Bilateral     VENTRICULOPERITONEAL SHUNT      Family Psychiatric History:   Family History:  Family History  Problem Relation Age of Onset   Hypertension Mother    Renal Disease Father    Emphysema Maternal Grandfather        Died at 6    Social History:  Social History   Socioeconomic History   Marital status: Single    Spouse name: Not on file   Number of children: Not on file   Years of education: Not on file   Highest education level: Not on file  Occupational History   Occupation: disabled  Tobacco Use   Smoking status: Never   Smokeless tobacco: Never  Vaping Use   Vaping status: Never Used  Substance and Sexual Activity   Alcohol use: No   Drug use: No   Sexual activity: Never  Other Topics Concern   Not on file  Social History Narrative   ** Merged History Encounter **       Sergio [name] is a 38 year old male. He lives with both of his parents Sergio [name] and Sergio [name]). He has 1 sister,who is 32.  He enjoys making his bed and watching movies. He has 700 movies.  Down's syndrome and schizophrenia - very dependent on his mother, Sergio [name]   Social Drivers of Health   Tobacco Use: Low Risk (04/01/2023)   Patient History    Smoking Tobacco Use: Never    Smokeless Tobacco Use: Never    Passive Exposure: Not on file  Financial Resource Strain: Medium Risk (05/17/2021)   Overall Financial Resource Strain (CARDIA)    Difficulty of Paying Living Expenses: Somewhat hard  Food Insecurity: Food Insecurity Present (05/17/2021)   Hunger Vital Sign    Worried About Running Out of Food in the Last Year: Sometimes true    Ran Out of Food in the Last Year: Never true  Transportation Needs: No Transportation Needs (05/17/2021)   PRAPARE - Administrator, Civil Service (Medical): No    Lack of Transportation (Non-Medical): No  Physical Activity: Inactive (05/17/2021)   Exercise Vital Sign    Days of Exercise per Week: 0 days    Minutes of Exercise per Session: 0 min  Stress: Stress Concern  Present (05/17/2021)   [name] - Occupational Stress Questionnaire    Feeling of Stress : Rather much  Social Connections: Moderately Integrated (05/17/2021)   Social Connection and Isolation Panel    Frequency of Communication with Friends and Family: Never    Frequency of Social Gatherings with Friends and Family: More than three times a week    Attends Religious Services: 1 to 4 times per year    Active Member of Clubs or Organizations: Yes    Attends Banker Meetings: 1 to 4 times per year    Marital Status: Never married  Depression (PHQ2-9): Low Risk (11/26/2022)   Depression (PHQ2-9)    PHQ-2 Score: 3  Alcohol Screen: Low Risk (05/17/2021)   Alcohol Screen    Last Alcohol Screening Score (AUDIT): 0  Housing: Low Risk (05/17/2021)  Housing    Last Housing Risk Score: 0  Utilities: Not on file  Health Literacy: Not on file    Allergies:  Allergies  Allergen Reactions   Cefaclor Anaphylaxis and Shortness Of Breath   Penicillins Anaphylaxis and Rash    Has patient had a PCN reaction causing immediate rash, facial/tongue/throat swelling, SOB or lightheadedness with hypotension: yes Has patient had a PCN reaction causing severe rash involving mucus membranes or skin necrosis: no Has patient had a PCN reaction that required hospitalization: no Has patient had a PCN reaction occurring within the last 10 years: no If all of the above answers are NO, then may proceed with Cephalosporin use.    Clindamycin  Rash   Clindamycin /Lincomycin Rash    Metabolic Disorder Labs: Lab Results  Component Value Date   HGBA1C 5.5 05/27/2022   MPG 111.15 05/27/2022   Lab Results  Component Value Date   PROLACTIN 51.5 (H) 01/21/2013   No results found for: CHOL, TRIG, HDL, CHOLHDL, VLDL, LDLCALC Lab Results  Component Value Date   TSH 3.020 01/21/2013    Therapeutic Level Labs: No results found for: LITHIUM No results found  for: VALPROATE No results found for: CBMZ  Current Medications: Current Outpatient Medications  Medication Sig Dispense Refill   clotrimazole -betamethasone  (LOTRISONE ) cream Apply 1 Application topically 2 (two) times daily. X10-14 days as needed for foot 30 g 0   hydrOXYzine  (ATARAX ) 50 MG tablet Take 1 tablet (50 mg total) by mouth 3 (three) times daily as needed. 90 tablet 2   ipratropium-albuterol  (DUONEB) 0.5-2.5 (3) MG/3ML SOLN NEBULIZE 1 VIAL EVERY 6 HOURS AS NEEDED FOR WHEEZING OR SHORTNESS OF BREATH (Patient taking differently: Take 3 mLs by nebulization every 6 (six) hours as needed (for shortness of breath or wheezing).) 180 mL 2   ketoconazole  (NIZORAL ) 2 % cream Apply 0.5gm to affected area once daily for up to 6 weeks per fungal rash flareup 120 g PRN   ketoconazole  (NIZORAL ) 2 % shampoo AAA, lather, leave on for 10 minutes. Then rinse off.  Use twice weekly AS needed 120 mL PRN   Melatonin 10 MG TABS Take 10 mg by mouth at bedtime.      metroNIDAZOLE  (METROCREAM ) 0.75 % cream APPLY TO THE AFFECTED AREA(S) ON face TWICE DAILY. 45 g PRN   paliperidone  (INVEGA ) 6 MG 24 hr tablet Take 1 tablet (6 mg total) by mouth at bedtime. 30 tablet 0   traZODone  (DESYREL ) 100 MG tablet Take 2 tablets (200 mg total) by mouth at bedtime. 60 tablet 0   Vitamin D , Ergocalciferol , (DRISDOL ) 1.25 MG (50000 UNIT) CAPS capsule Take 1 capsule (50,000 Units total) by mouth every 7 (seven) days. (Patient taking differently: Take 50,000 Units by mouth daily.) 12 capsule 3   No current facility-administered medications for this visit.     Musculoskeletal: Video assessment virtual  Psychiatric Specialty Exam: Review of Systems  Psychiatric/Behavioral:  Positive for sleep disturbance. Negative for agitation and behavioral problems. The patient is nervous/anxious.   All other systems reviewed and are negative.   There were no vitals taken for this visit.There is no height or weight on file to  calculate BMI.  General Appearance: Casual, observed resting in bed  Eye Contact:   minimal  Speech:  patient is mostly nonverbal, will answer yes or no to most questions asked  Volume:  n/a  Mood:  NA  Affect:  NA  Thought Process:  NA  Orientation:  NA  Thought Content: NA  Suicidal Thoughts:  Mother legal guardian denied any safety concerns related to self injures behaviors or suicidal ideations  Homicidal Thoughts:  No  Memory:  NA  Judgement:  NA  Insight:  NA  Psychomotor Activity:  NA  Concentration:  Concentration: NA  Recall:  NA  Fund of Knowledge: NA  Language: NA  Akathisia:  NA  Handed:  Right  AIMS (if indicated): n/a  Assets:  Others:  N/A  ADL's: n/a  Cognition: n/a  Sleep:  Fair improving, however did wake up about 5 am this moring and is current trying to rest.     Screenings: GAD-7    Flowsheet Row Office Visit from 11/26/2022 in [facility] Office Visit from 03/05/2021 in [facility]  Total GAD-7 Score 0 0   PHQ2-9    Flowsheet Row Office Visit from 11/26/2022 in [facility] Clinical Support from 05/17/2021 in [facility] Office Visit from 03/05/2021 in [facility] Office Visit from 12/30/2017 in [facility] Office Visit from 11/11/2017 in [facility]  PHQ-2 Total Score 0 0 0 0 0  PHQ-9 Total Score 3 -- 5 -- --   Flowsheet Row ED to Hosp-Admission (Discharged) from 05/26/2022 in [facility]  C-SSRS RISK CATEGORY No Risk     Assessment and Plan: [name]'s legal guardian Sergio [name] mother on video call. Patient observed resting in bed.  Smiling did wave hi.  Very minimal verbally reported baseline.  Tripp's mother Sergio [name] reported concerns related to auditory and visual hallucinations that patient may  possibly be experiencing.  Reports patient has been referring to entities/ weirdos within the past 6 weeks.  Discussed titration to paliperidone  for symptom management.  Follow-up 2 months for adherence/tolerability.  Keep outpatient follow-up appointment with primary care provider will need metabolic panel to include TSH prolactin and EKG.SABRA   Collaboration of Care: Collaboration of Care: Other outpatient labs TSH, CBC, A1c, prolactin and EKG.  Patient/Guardian was advised Release of Information must be obtained prior to any record release in order to collaborate their care with an outside provider. Patient/Guardian was advised if they have not already done so to contact the registration department to sign all necessary forms in order for us  to release information regarding their care.   Consent: Patient/Guardian gives verbal consent for treatment and assignment of benefits for services provided during this visit. Patient/Guardian expressed understanding and agreed to proceed.    Staci LOISE Kerns, NP` 04/27/2024, 12:27 PM  "

## 2024-06-29 ENCOUNTER — Telehealth (HOSPITAL_COMMUNITY): Admitting: Family
